# Patient Record
Sex: Female | Born: 1980 | Race: Black or African American | Hispanic: No | Smoking: Never smoker
Health system: Southern US, Community
[De-identification: ages and names within clinical notes are randomized; demographics above are authoritative.]

## PROBLEM LIST (undated history)

## (undated) DIAGNOSIS — R112 Nausea with vomiting, unspecified: Secondary | ICD-10-CM

## (undated) DIAGNOSIS — A048 Other specified bacterial intestinal infections: Secondary | ICD-10-CM

## (undated) DIAGNOSIS — R569 Unspecified convulsions: Secondary | ICD-10-CM

## (undated) DIAGNOSIS — E538 Deficiency of other specified B group vitamins: Secondary | ICD-10-CM

## (undated) DIAGNOSIS — G8929 Other chronic pain: Secondary | ICD-10-CM

## (undated) DIAGNOSIS — E559 Vitamin D deficiency, unspecified: Secondary | ICD-10-CM

## (undated) DIAGNOSIS — F319 Bipolar disorder, unspecified: Secondary | ICD-10-CM

## (undated) DIAGNOSIS — G43909 Migraine, unspecified, not intractable, without status migrainosus: Secondary | ICD-10-CM

## (undated) DIAGNOSIS — D126 Benign neoplasm of colon, unspecified: Secondary | ICD-10-CM

## (undated) DIAGNOSIS — K3184 Gastroparesis: Secondary | ICD-10-CM

## (undated) DIAGNOSIS — N61 Mastitis without abscess: Secondary | ICD-10-CM

## (undated) DIAGNOSIS — D649 Anemia, unspecified: Secondary | ICD-10-CM

## (undated) DIAGNOSIS — K648 Other hemorrhoids: Secondary | ICD-10-CM

## (undated) DIAGNOSIS — I10 Essential (primary) hypertension: Secondary | ICD-10-CM

## (undated) DIAGNOSIS — L509 Urticaria, unspecified: Secondary | ICD-10-CM

## (undated) DIAGNOSIS — E669 Obesity, unspecified: Secondary | ICD-10-CM

## (undated) DIAGNOSIS — F419 Anxiety disorder, unspecified: Secondary | ICD-10-CM

## (undated) DIAGNOSIS — N760 Acute vaginitis: Secondary | ICD-10-CM

## (undated) DIAGNOSIS — K5792 Diverticulitis of intestine, part unspecified, without perforation or abscess without bleeding: Secondary | ICD-10-CM

## (undated) DIAGNOSIS — T4145XA Adverse effect of unspecified anesthetic, initial encounter: Secondary | ICD-10-CM

## (undated) DIAGNOSIS — Z9889 Other specified postprocedural states: Secondary | ICD-10-CM

## (undated) HISTORY — PX: ABDOMINAL HYSTERECTOMY: SHX81

## (undated) HISTORY — DX: Other hemorrhoids: K64.8

## (undated) HISTORY — DX: Benign neoplasm of colon, unspecified: D12.6

## (undated) HISTORY — DX: Vitamin D deficiency, unspecified: E55.9

## (undated) HISTORY — DX: Anemia, unspecified: D64.9

## (undated) HISTORY — DX: Other specified bacterial intestinal infections: A04.8

## (undated) HISTORY — DX: Mastitis without abscess: N61.0

## (undated) HISTORY — DX: Obesity, unspecified: E66.9

## (undated) HISTORY — DX: Essential (primary) hypertension: I10

## (undated) HISTORY — DX: Urticaria, unspecified: L50.9

## (undated) HISTORY — PX: TUMOR REMOVAL: SHX12

## (undated) HISTORY — DX: Migraine, unspecified, not intractable, without status migrainosus: G43.909

## (undated) HISTORY — PX: CHOLECYSTECTOMY: SHX55

## (undated) HISTORY — PX: REDUCTION MAMMAPLASTY: SUR839

## (undated) HISTORY — PX: TUBAL LIGATION: SHX77

## (undated) HISTORY — PX: WISDOM TOOTH EXTRACTION: SHX21

## (undated) HISTORY — DX: Acute vaginitis: N76.0

## (undated) HISTORY — DX: Bipolar disorder, unspecified: F31.9

---

## 1998-02-24 ENCOUNTER — Inpatient Hospital Stay (HOSPITAL_COMMUNITY): Admission: EM | Admit: 1998-02-24 | Discharge: 1998-02-24 | Payer: Self-pay | Admitting: *Deleted

## 2004-08-20 ENCOUNTER — Ambulatory Visit: Payer: Self-pay | Admitting: Internal Medicine

## 2005-09-27 ENCOUNTER — Ambulatory Visit: Payer: Self-pay | Admitting: Internal Medicine

## 2005-10-21 ENCOUNTER — Ambulatory Visit: Payer: Self-pay | Admitting: Internal Medicine

## 2005-10-21 ENCOUNTER — Encounter (INDEPENDENT_AMBULATORY_CARE_PROVIDER_SITE_OTHER): Payer: Self-pay | Admitting: Family Medicine

## 2005-10-21 ENCOUNTER — Ambulatory Visit (HOSPITAL_COMMUNITY): Admission: RE | Admit: 2005-10-21 | Discharge: 2005-10-21 | Payer: Self-pay | Admitting: Internal Medicine

## 2005-10-21 LAB — CONVERTED CEMR LAB: Pap Smear: NORMAL

## 2005-11-13 ENCOUNTER — Ambulatory Visit (HOSPITAL_COMMUNITY): Admission: RE | Admit: 2005-11-13 | Discharge: 2005-11-13 | Payer: Self-pay | Admitting: General Surgery

## 2005-11-13 ENCOUNTER — Encounter (INDEPENDENT_AMBULATORY_CARE_PROVIDER_SITE_OTHER): Payer: Self-pay | Admitting: Specialist

## 2006-03-11 HISTORY — PX: ESOPHAGOGASTRODUODENOSCOPY: SHX1529

## 2006-03-11 HISTORY — PX: APPENDECTOMY: SHX54

## 2006-03-24 ENCOUNTER — Ambulatory Visit: Payer: Self-pay | Admitting: Family Medicine

## 2006-03-27 ENCOUNTER — Ambulatory Visit (HOSPITAL_COMMUNITY): Admission: RE | Admit: 2006-03-27 | Discharge: 2006-03-27 | Payer: Self-pay | Admitting: Family Medicine

## 2006-03-27 ENCOUNTER — Encounter (INDEPENDENT_AMBULATORY_CARE_PROVIDER_SITE_OTHER): Payer: Self-pay | Admitting: Family Medicine

## 2006-03-28 ENCOUNTER — Encounter: Payer: Self-pay | Admitting: Family Medicine

## 2006-03-28 DIAGNOSIS — K219 Gastro-esophageal reflux disease without esophagitis: Secondary | ICD-10-CM | POA: Insufficient documentation

## 2006-03-28 DIAGNOSIS — K648 Other hemorrhoids: Secondary | ICD-10-CM

## 2006-03-28 DIAGNOSIS — G43909 Migraine, unspecified, not intractable, without status migrainosus: Secondary | ICD-10-CM | POA: Insufficient documentation

## 2006-03-28 DIAGNOSIS — K5900 Constipation, unspecified: Secondary | ICD-10-CM | POA: Insufficient documentation

## 2006-03-28 HISTORY — DX: Other hemorrhoids: K64.8

## 2006-04-02 ENCOUNTER — Ambulatory Visit: Payer: Self-pay | Admitting: Family Medicine

## 2006-04-10 ENCOUNTER — Ambulatory Visit: Payer: Self-pay | Admitting: Family Medicine

## 2006-04-24 ENCOUNTER — Encounter: Payer: Self-pay | Admitting: Family Medicine

## 2006-04-29 ENCOUNTER — Telehealth (INDEPENDENT_AMBULATORY_CARE_PROVIDER_SITE_OTHER): Payer: Self-pay | Admitting: Family Medicine

## 2006-04-29 ENCOUNTER — Encounter (INDEPENDENT_AMBULATORY_CARE_PROVIDER_SITE_OTHER): Payer: Self-pay | Admitting: Family Medicine

## 2006-05-06 ENCOUNTER — Ambulatory Visit: Payer: Self-pay | Admitting: Internal Medicine

## 2006-05-06 ENCOUNTER — Encounter (INDEPENDENT_AMBULATORY_CARE_PROVIDER_SITE_OTHER): Payer: Self-pay | Admitting: Family Medicine

## 2006-05-08 ENCOUNTER — Ambulatory Visit: Payer: Self-pay | Admitting: Family Medicine

## 2006-05-09 ENCOUNTER — Encounter (INDEPENDENT_AMBULATORY_CARE_PROVIDER_SITE_OTHER): Payer: Self-pay | Admitting: Family Medicine

## 2006-05-09 ENCOUNTER — Telehealth (INDEPENDENT_AMBULATORY_CARE_PROVIDER_SITE_OTHER): Payer: Self-pay | Admitting: Family Medicine

## 2006-05-09 ENCOUNTER — Ambulatory Visit (HOSPITAL_COMMUNITY): Admission: RE | Admit: 2006-05-09 | Discharge: 2006-05-09 | Payer: Self-pay | Admitting: Family Medicine

## 2006-05-12 ENCOUNTER — Telehealth (INDEPENDENT_AMBULATORY_CARE_PROVIDER_SITE_OTHER): Payer: Self-pay | Admitting: Family Medicine

## 2006-05-26 ENCOUNTER — Ambulatory Visit (HOSPITAL_COMMUNITY): Admission: RE | Admit: 2006-05-26 | Discharge: 2006-05-26 | Payer: Self-pay | Admitting: General Surgery

## 2006-05-26 ENCOUNTER — Encounter (INDEPENDENT_AMBULATORY_CARE_PROVIDER_SITE_OTHER): Payer: Self-pay | Admitting: *Deleted

## 2006-06-16 ENCOUNTER — Telehealth (INDEPENDENT_AMBULATORY_CARE_PROVIDER_SITE_OTHER): Payer: Self-pay | Admitting: Family Medicine

## 2006-06-17 ENCOUNTER — Ambulatory Visit (HOSPITAL_COMMUNITY): Admission: RE | Admit: 2006-06-17 | Discharge: 2006-06-17 | Payer: Self-pay | Admitting: Family Medicine

## 2006-06-17 ENCOUNTER — Ambulatory Visit: Payer: Self-pay | Admitting: Family Medicine

## 2006-06-27 ENCOUNTER — Telehealth (INDEPENDENT_AMBULATORY_CARE_PROVIDER_SITE_OTHER): Payer: Self-pay | Admitting: Family Medicine

## 2006-08-19 ENCOUNTER — Telehealth (INDEPENDENT_AMBULATORY_CARE_PROVIDER_SITE_OTHER): Payer: Self-pay | Admitting: *Deleted

## 2006-08-20 ENCOUNTER — Ambulatory Visit: Payer: Self-pay | Admitting: Family Medicine

## 2006-08-20 ENCOUNTER — Ambulatory Visit (HOSPITAL_COMMUNITY): Admission: RE | Admit: 2006-08-20 | Discharge: 2006-08-20 | Payer: Self-pay | Admitting: Family Medicine

## 2006-08-22 ENCOUNTER — Encounter (INDEPENDENT_AMBULATORY_CARE_PROVIDER_SITE_OTHER): Payer: Self-pay | Admitting: Family Medicine

## 2006-08-22 ENCOUNTER — Telehealth (INDEPENDENT_AMBULATORY_CARE_PROVIDER_SITE_OTHER): Payer: Self-pay | Admitting: *Deleted

## 2006-08-22 LAB — CONVERTED CEMR LAB
Basophils Absolute: 0 10*3/uL (ref 0.0–0.1)
Basophils Relative: 0 % (ref 0–1)
Eosinophils Absolute: 0.2 10*3/uL (ref 0.0–0.7)
Eosinophils Relative: 2 % (ref 0–5)
HCT: 38.3 % (ref 36.0–46.0)
Hemoglobin: 12 g/dL (ref 12.0–15.0)
Lymphocytes Relative: 18 % (ref 12–46)
Lymphs Abs: 1.7 10*3/uL (ref 0.7–3.3)
MCHC: 31.3 g/dL (ref 30.0–36.0)
MCV: 80.3 fL (ref 78.0–100.0)
Monocytes Absolute: 0.4 10*3/uL (ref 0.2–0.7)
Monocytes Relative: 4 % (ref 3–11)
Neutro Abs: 6.9 10*3/uL (ref 1.7–7.7)
Neutrophils Relative %: 75 % (ref 43–77)
Platelets: 338 10*3/uL (ref 150–400)
RBC: 4.77 M/uL (ref 3.87–5.11)
RDW: 14.4 % — ABNORMAL HIGH (ref 11.5–14.0)
Sed Rate: 48 mm/hr — ABNORMAL HIGH (ref 0–22)
WBC: 9.2 10*3/uL (ref 4.0–10.5)

## 2006-08-25 ENCOUNTER — Telehealth (INDEPENDENT_AMBULATORY_CARE_PROVIDER_SITE_OTHER): Payer: Self-pay | Admitting: *Deleted

## 2006-08-28 ENCOUNTER — Encounter (INDEPENDENT_AMBULATORY_CARE_PROVIDER_SITE_OTHER): Payer: Self-pay | Admitting: Family Medicine

## 2006-09-19 ENCOUNTER — Telehealth (INDEPENDENT_AMBULATORY_CARE_PROVIDER_SITE_OTHER): Payer: Self-pay | Admitting: *Deleted

## 2006-09-19 ENCOUNTER — Ambulatory Visit: Payer: Self-pay | Admitting: Family Medicine

## 2006-09-19 DIAGNOSIS — E669 Obesity, unspecified: Secondary | ICD-10-CM | POA: Insufficient documentation

## 2006-09-19 LAB — CONVERTED CEMR LAB
Beta hcg, urine, semiquantitative: NEGATIVE
Glucose, Urine, Semiquant: NEGATIVE
Nitrite: NEGATIVE
Protein, U semiquant: 100
Specific Gravity, Urine: 1.02
Urobilinogen, UA: 1
WBC Urine, dipstick: NEGATIVE
pH: 6

## 2006-09-20 ENCOUNTER — Encounter (INDEPENDENT_AMBULATORY_CARE_PROVIDER_SITE_OTHER): Payer: Self-pay | Admitting: Family Medicine

## 2006-09-21 LAB — CONVERTED CEMR LAB
ALT: 12 units/L (ref 0–35)
AST: 20 units/L (ref 0–37)
Albumin: 3.9 g/dL (ref 3.5–5.2)
Alkaline Phosphatase: 81 units/L (ref 39–117)
Amylase: 47 units/L (ref 0–105)
BUN: 9 mg/dL (ref 6–23)
Basophils Absolute: 0 10*3/uL (ref 0.0–0.1)
Basophils Relative: 0 % (ref 0–1)
CO2: 21 meq/L (ref 19–32)
Calcium: 9 mg/dL (ref 8.4–10.5)
Chloride: 103 meq/L (ref 96–112)
Creatinine, Ser: 0.83 mg/dL (ref 0.40–1.20)
Eosinophils Absolute: 0.2 10*3/uL (ref 0.0–0.7)
Eosinophils Relative: 2 % (ref 0–5)
Glucose, Bld: 70 mg/dL (ref 70–99)
HCT: 41.1 % (ref 36.0–46.0)
Hemoglobin: 12.7 g/dL (ref 12.0–15.0)
Lipase: 18 units/L (ref 0–75)
Lymphocytes Relative: 15 % (ref 12–46)
Lymphs Abs: 1.5 10*3/uL (ref 0.7–3.3)
MCHC: 30.9 g/dL (ref 30.0–36.0)
MCV: 80.9 fL (ref 78.0–100.0)
Monocytes Absolute: 0.4 10*3/uL (ref 0.2–0.7)
Monocytes Relative: 4 % (ref 3–11)
Neutro Abs: 7.9 10*3/uL — ABNORMAL HIGH (ref 1.7–7.7)
Neutrophils Relative %: 80 % — ABNORMAL HIGH (ref 43–77)
Platelets: 263 10*3/uL (ref 150–400)
Potassium: 4.2 meq/L (ref 3.5–5.3)
RBC: 5.08 M/uL (ref 3.87–5.11)
RDW: 15 % — ABNORMAL HIGH (ref 11.5–14.0)
Sodium: 139 meq/L (ref 135–145)
Total Bilirubin: 0.5 mg/dL (ref 0.3–1.2)
Total Protein: 7.6 g/dL (ref 6.0–8.3)
WBC: 9.9 10*3/uL (ref 4.0–10.5)

## 2006-09-22 ENCOUNTER — Telehealth (INDEPENDENT_AMBULATORY_CARE_PROVIDER_SITE_OTHER): Payer: Self-pay | Admitting: *Deleted

## 2006-09-23 ENCOUNTER — Encounter (INDEPENDENT_AMBULATORY_CARE_PROVIDER_SITE_OTHER): Payer: Self-pay | Admitting: Family Medicine

## 2006-09-23 ENCOUNTER — Telehealth (INDEPENDENT_AMBULATORY_CARE_PROVIDER_SITE_OTHER): Payer: Self-pay | Admitting: *Deleted

## 2006-09-26 ENCOUNTER — Telehealth (INDEPENDENT_AMBULATORY_CARE_PROVIDER_SITE_OTHER): Payer: Self-pay | Admitting: Family Medicine

## 2006-09-26 ENCOUNTER — Ambulatory Visit: Payer: Self-pay | Admitting: Family Medicine

## 2006-09-29 ENCOUNTER — Telehealth (INDEPENDENT_AMBULATORY_CARE_PROVIDER_SITE_OTHER): Payer: Self-pay | Admitting: Family Medicine

## 2006-09-30 ENCOUNTER — Telehealth (INDEPENDENT_AMBULATORY_CARE_PROVIDER_SITE_OTHER): Payer: Self-pay | Admitting: Family Medicine

## 2006-10-01 ENCOUNTER — Encounter (INDEPENDENT_AMBULATORY_CARE_PROVIDER_SITE_OTHER): Payer: Self-pay | Admitting: Family Medicine

## 2006-10-02 ENCOUNTER — Telehealth (INDEPENDENT_AMBULATORY_CARE_PROVIDER_SITE_OTHER): Payer: Self-pay | Admitting: *Deleted

## 2006-10-02 ENCOUNTER — Encounter (INDEPENDENT_AMBULATORY_CARE_PROVIDER_SITE_OTHER): Payer: Self-pay | Admitting: Family Medicine

## 2006-10-02 ENCOUNTER — Ambulatory Visit: Payer: Self-pay | Admitting: Orthopedic Surgery

## 2006-10-06 ENCOUNTER — Encounter (INDEPENDENT_AMBULATORY_CARE_PROVIDER_SITE_OTHER): Payer: Self-pay | Admitting: Family Medicine

## 2006-10-06 ENCOUNTER — Ambulatory Visit (HOSPITAL_COMMUNITY): Admission: RE | Admit: 2006-10-06 | Discharge: 2006-10-06 | Payer: Self-pay | Admitting: Family Medicine

## 2006-10-06 ENCOUNTER — Telehealth (INDEPENDENT_AMBULATORY_CARE_PROVIDER_SITE_OTHER): Payer: Self-pay | Admitting: *Deleted

## 2006-10-13 ENCOUNTER — Telehealth (INDEPENDENT_AMBULATORY_CARE_PROVIDER_SITE_OTHER): Payer: Self-pay | Admitting: Family Medicine

## 2006-10-13 ENCOUNTER — Ambulatory Visit: Payer: Self-pay | Admitting: Internal Medicine

## 2006-10-23 ENCOUNTER — Ambulatory Visit: Payer: Self-pay | Admitting: Internal Medicine

## 2006-10-23 ENCOUNTER — Ambulatory Visit (HOSPITAL_COMMUNITY): Admission: RE | Admit: 2006-10-23 | Discharge: 2006-10-23 | Payer: Self-pay | Admitting: Internal Medicine

## 2006-10-30 ENCOUNTER — Ambulatory Visit: Payer: Self-pay | Admitting: Orthopedic Surgery

## 2006-10-31 ENCOUNTER — Ambulatory Visit (HOSPITAL_COMMUNITY): Admission: RE | Admit: 2006-10-31 | Discharge: 2006-10-31 | Payer: Self-pay | Admitting: Orthopedic Surgery

## 2006-11-11 ENCOUNTER — Ambulatory Visit: Payer: Self-pay | Admitting: Orthopedic Surgery

## 2006-11-20 ENCOUNTER — Encounter: Admission: RE | Admit: 2006-11-20 | Discharge: 2006-11-20 | Payer: Self-pay | Admitting: Orthopedic Surgery

## 2006-12-04 ENCOUNTER — Encounter: Admission: RE | Admit: 2006-12-04 | Discharge: 2006-12-04 | Payer: Self-pay | Admitting: Orthopedic Surgery

## 2006-12-15 ENCOUNTER — Other Ambulatory Visit: Admission: RE | Admit: 2006-12-15 | Discharge: 2006-12-15 | Payer: Self-pay | Admitting: Family Medicine

## 2006-12-15 ENCOUNTER — Ambulatory Visit: Payer: Self-pay | Admitting: Family Medicine

## 2006-12-15 ENCOUNTER — Encounter (INDEPENDENT_AMBULATORY_CARE_PROVIDER_SITE_OTHER): Payer: Self-pay | Admitting: Family Medicine

## 2006-12-15 LAB — CONVERTED CEMR LAB
Cholesterol, target level: 200 mg/dL
HDL goal, serum: 40 mg/dL
LDL Goal: 160 mg/dL
Rapid Strep: NEGATIVE

## 2006-12-16 ENCOUNTER — Telehealth (INDEPENDENT_AMBULATORY_CARE_PROVIDER_SITE_OTHER): Payer: Self-pay | Admitting: *Deleted

## 2006-12-16 ENCOUNTER — Encounter (INDEPENDENT_AMBULATORY_CARE_PROVIDER_SITE_OTHER): Payer: Self-pay | Admitting: Family Medicine

## 2006-12-16 LAB — CONVERTED CEMR LAB
Candida species: NEGATIVE
Gardnerella vaginalis: NEGATIVE
Trichomonal Vaginitis: NEGATIVE

## 2006-12-17 ENCOUNTER — Telehealth (INDEPENDENT_AMBULATORY_CARE_PROVIDER_SITE_OTHER): Payer: Self-pay | Admitting: Family Medicine

## 2006-12-19 ENCOUNTER — Telehealth (INDEPENDENT_AMBULATORY_CARE_PROVIDER_SITE_OTHER): Payer: Self-pay | Admitting: Family Medicine

## 2006-12-22 ENCOUNTER — Ambulatory Visit: Payer: Self-pay | Admitting: Orthopedic Surgery

## 2007-01-06 ENCOUNTER — Telehealth (INDEPENDENT_AMBULATORY_CARE_PROVIDER_SITE_OTHER): Payer: Self-pay | Admitting: Family Medicine

## 2007-03-25 ENCOUNTER — Telehealth (INDEPENDENT_AMBULATORY_CARE_PROVIDER_SITE_OTHER): Payer: Self-pay | Admitting: Family Medicine

## 2007-03-26 ENCOUNTER — Telehealth (INDEPENDENT_AMBULATORY_CARE_PROVIDER_SITE_OTHER): Payer: Self-pay | Admitting: Family Medicine

## 2007-04-09 ENCOUNTER — Telehealth (INDEPENDENT_AMBULATORY_CARE_PROVIDER_SITE_OTHER): Payer: Self-pay | Admitting: Family Medicine

## 2007-04-10 ENCOUNTER — Ambulatory Visit: Payer: Self-pay | Admitting: Family Medicine

## 2007-04-10 ENCOUNTER — Telehealth (INDEPENDENT_AMBULATORY_CARE_PROVIDER_SITE_OTHER): Payer: Self-pay | Admitting: *Deleted

## 2007-04-10 DIAGNOSIS — M940 Chondrocostal junction syndrome [Tietze]: Secondary | ICD-10-CM

## 2007-04-10 DIAGNOSIS — I517 Cardiomegaly: Secondary | ICD-10-CM | POA: Insufficient documentation

## 2007-04-10 HISTORY — DX: Chondrocostal junction syndrome (tietze): M94.0

## 2007-04-15 ENCOUNTER — Encounter (INDEPENDENT_AMBULATORY_CARE_PROVIDER_SITE_OTHER): Payer: Self-pay | Admitting: Family Medicine

## 2007-04-15 ENCOUNTER — Ambulatory Visit (HOSPITAL_COMMUNITY): Admission: RE | Admit: 2007-04-15 | Discharge: 2007-04-15 | Payer: Self-pay | Admitting: Family Medicine

## 2007-04-15 ENCOUNTER — Ambulatory Visit: Payer: Self-pay | Admitting: Cardiovascular Disease

## 2007-04-20 ENCOUNTER — Telehealth (INDEPENDENT_AMBULATORY_CARE_PROVIDER_SITE_OTHER): Payer: Self-pay | Admitting: *Deleted

## 2007-04-20 ENCOUNTER — Encounter (INDEPENDENT_AMBULATORY_CARE_PROVIDER_SITE_OTHER): Payer: Self-pay | Admitting: Family Medicine

## 2007-04-27 ENCOUNTER — Telehealth (INDEPENDENT_AMBULATORY_CARE_PROVIDER_SITE_OTHER): Payer: Self-pay | Admitting: *Deleted

## 2007-04-28 ENCOUNTER — Ambulatory Visit: Payer: Self-pay | Admitting: Family Medicine

## 2007-04-28 LAB — CONVERTED CEMR LAB
Bilirubin Urine: NEGATIVE
Blood in Urine, dipstick: NEGATIVE
Glucose, Urine, Semiquant: NEGATIVE
Ketones, urine, test strip: NEGATIVE
Nitrite: POSITIVE
Protein, U semiquant: 30
Specific Gravity, Urine: 1.025
Urobilinogen, UA: 0.2
pH: 7

## 2007-05-06 ENCOUNTER — Encounter (INDEPENDENT_AMBULATORY_CARE_PROVIDER_SITE_OTHER): Payer: Self-pay | Admitting: Family Medicine

## 2007-05-06 ENCOUNTER — Telehealth (INDEPENDENT_AMBULATORY_CARE_PROVIDER_SITE_OTHER): Payer: Self-pay | Admitting: *Deleted

## 2007-05-08 ENCOUNTER — Ambulatory Visit: Payer: Self-pay | Admitting: Family Medicine

## 2007-05-12 ENCOUNTER — Encounter (INDEPENDENT_AMBULATORY_CARE_PROVIDER_SITE_OTHER): Payer: Self-pay | Admitting: Family Medicine

## 2007-05-12 ENCOUNTER — Telehealth (INDEPENDENT_AMBULATORY_CARE_PROVIDER_SITE_OTHER): Payer: Self-pay | Admitting: *Deleted

## 2007-05-15 ENCOUNTER — Encounter (INDEPENDENT_AMBULATORY_CARE_PROVIDER_SITE_OTHER): Payer: Self-pay | Admitting: Family Medicine

## 2007-05-15 LAB — CONVERTED CEMR LAB: Creatinine, Ser: 0.8 mg/dL (ref 0.40–1.20)

## 2007-05-26 ENCOUNTER — Ambulatory Visit (HOSPITAL_COMMUNITY): Admission: RE | Admit: 2007-05-26 | Discharge: 2007-05-26 | Payer: Self-pay | Admitting: Obstetrics & Gynecology

## 2007-05-26 ENCOUNTER — Encounter (INDEPENDENT_AMBULATORY_CARE_PROVIDER_SITE_OTHER): Payer: Self-pay | Admitting: Family Medicine

## 2007-06-09 ENCOUNTER — Telehealth (INDEPENDENT_AMBULATORY_CARE_PROVIDER_SITE_OTHER): Payer: Self-pay | Admitting: Family Medicine

## 2007-06-25 ENCOUNTER — Telehealth (INDEPENDENT_AMBULATORY_CARE_PROVIDER_SITE_OTHER): Payer: Self-pay | Admitting: Family Medicine

## 2007-08-02 ENCOUNTER — Encounter: Admission: RE | Admit: 2007-08-02 | Discharge: 2007-08-02 | Payer: Self-pay | Admitting: Internal Medicine

## 2007-08-10 HISTORY — PX: OTHER SURGICAL HISTORY: SHX169

## 2007-08-12 ENCOUNTER — Encounter: Payer: Self-pay | Admitting: Obstetrics & Gynecology

## 2007-08-12 ENCOUNTER — Inpatient Hospital Stay (HOSPITAL_COMMUNITY): Admission: RE | Admit: 2007-08-12 | Discharge: 2007-08-14 | Payer: Self-pay | Admitting: Obstetrics & Gynecology

## 2007-11-02 ENCOUNTER — Telehealth (INDEPENDENT_AMBULATORY_CARE_PROVIDER_SITE_OTHER): Payer: Self-pay | Admitting: *Deleted

## 2007-11-23 ENCOUNTER — Ambulatory Visit: Payer: Self-pay | Admitting: Internal Medicine

## 2007-11-23 ENCOUNTER — Ambulatory Visit (HOSPITAL_COMMUNITY): Admission: RE | Admit: 2007-11-23 | Discharge: 2007-11-23 | Payer: Self-pay | Admitting: Internal Medicine

## 2007-11-25 ENCOUNTER — Ambulatory Visit: Payer: Self-pay | Admitting: Internal Medicine

## 2007-12-10 ENCOUNTER — Ambulatory Visit: Payer: Self-pay | Admitting: Internal Medicine

## 2008-02-12 ENCOUNTER — Ambulatory Visit: Payer: Self-pay | Admitting: Family Medicine

## 2008-02-12 DIAGNOSIS — M545 Low back pain, unspecified: Secondary | ICD-10-CM | POA: Insufficient documentation

## 2008-02-12 DIAGNOSIS — I10 Essential (primary) hypertension: Secondary | ICD-10-CM | POA: Insufficient documentation

## 2008-02-17 ENCOUNTER — Encounter (INDEPENDENT_AMBULATORY_CARE_PROVIDER_SITE_OTHER): Payer: Self-pay | Admitting: Family Medicine

## 2008-03-18 ENCOUNTER — Ambulatory Visit: Payer: Self-pay | Admitting: Family Medicine

## 2008-03-21 LAB — CONVERTED CEMR LAB
ALT: 16 units/L (ref 0–35)
AST: 13 units/L (ref 0–37)
Albumin: 3.8 g/dL (ref 3.5–5.2)
Alkaline Phosphatase: 98 units/L (ref 39–117)
BUN: 11 mg/dL (ref 6–23)
Basophils Absolute: 0 10*3/uL (ref 0.0–0.1)
Basophils Relative: 0 % (ref 0–1)
CO2: 22 meq/L (ref 19–32)
Calcium: 8.9 mg/dL (ref 8.4–10.5)
Chloride: 105 meq/L (ref 96–112)
Cholesterol: 132 mg/dL (ref 0–200)
Creatinine, Ser: 0.73 mg/dL (ref 0.40–1.20)
Eosinophils Absolute: 0.2 10*3/uL (ref 0.0–0.7)
Eosinophils Relative: 3 % (ref 0–5)
Glucose, Bld: 83 mg/dL (ref 70–99)
HCT: 39.5 % (ref 36.0–46.0)
HDL: 43 mg/dL (ref >39)
Hemoglobin: 12.2 g/dL (ref 12.0–15.0)
LDL Cholesterol: 74 mg/dL (ref 0–99)
Lymphocytes Relative: 28 % (ref 12–46)
Lymphs Abs: 2.3 10*3/uL (ref 0.7–4.0)
MCHC: 30.9 g/dL (ref 30.0–36.0)
MCV: 79.8 fL (ref 78.0–100.0)
Monocytes Absolute: 0.4 10*3/uL (ref 0.1–1.0)
Monocytes Relative: 4 % (ref 3–12)
Neutro Abs: 5.4 10*3/uL (ref 1.7–7.7)
Neutrophils Relative %: 65 % (ref 43–77)
Platelets: 467 10*3/uL — ABNORMAL HIGH (ref 150–400)
Potassium: 3.9 meq/L (ref 3.5–5.3)
RBC: 4.95 M/uL (ref 3.87–5.11)
RDW: 14.5 % (ref 11.5–15.5)
Sodium: 141 meq/L (ref 135–145)
TSH: 0.464 microintl units/mL (ref 0.350–4.50)
Total Bilirubin: 0.2 mg/dL — ABNORMAL LOW (ref 0.3–1.2)
Total CHOL/HDL Ratio: 3.1
Total Protein: 7.6 g/dL (ref 6.0–8.3)
Triglycerides: 74 mg/dL (ref <150)
VLDL: 15 mg/dL (ref 0–40)
WBC: 8.3 10*3/uL (ref 4.0–10.5)

## 2008-03-28 ENCOUNTER — Telehealth (INDEPENDENT_AMBULATORY_CARE_PROVIDER_SITE_OTHER): Payer: Self-pay | Admitting: *Deleted

## 2008-03-28 ENCOUNTER — Ambulatory Visit: Payer: Self-pay | Admitting: Family Medicine

## 2008-03-28 DIAGNOSIS — R079 Chest pain, unspecified: Secondary | ICD-10-CM | POA: Insufficient documentation

## 2008-03-28 DIAGNOSIS — F3130 Bipolar disorder, current episode depressed, mild or moderate severity, unspecified: Secondary | ICD-10-CM

## 2008-03-28 HISTORY — DX: Bipolar disorder, current episode depressed, mild or moderate severity, unspecified: F31.30

## 2008-04-12 ENCOUNTER — Encounter (INDEPENDENT_AMBULATORY_CARE_PROVIDER_SITE_OTHER): Payer: Self-pay | Admitting: Family Medicine

## 2008-04-18 ENCOUNTER — Ambulatory Visit: Payer: Self-pay | Admitting: Family Medicine

## 2008-04-18 DIAGNOSIS — R809 Proteinuria, unspecified: Secondary | ICD-10-CM | POA: Insufficient documentation

## 2008-04-18 LAB — CONVERTED CEMR LAB
Bilirubin Urine: NEGATIVE
Blood in Urine, dipstick: NEGATIVE
Glucose, Urine, Semiquant: NEGATIVE
Ketones, urine, test strip: NEGATIVE
Nitrite: NEGATIVE
Protein, U semiquant: 30
Specific Gravity, Urine: 1.02
Urobilinogen, UA: 0.2
WBC Urine, dipstick: NEGATIVE
pH: 6.5

## 2008-05-16 ENCOUNTER — Ambulatory Visit: Payer: Self-pay | Admitting: Family Medicine

## 2008-05-30 ENCOUNTER — Ambulatory Visit: Payer: Self-pay | Admitting: Family Medicine

## 2008-05-30 ENCOUNTER — Telehealth (INDEPENDENT_AMBULATORY_CARE_PROVIDER_SITE_OTHER): Payer: Self-pay | Admitting: *Deleted

## 2008-05-31 ENCOUNTER — Telehealth (INDEPENDENT_AMBULATORY_CARE_PROVIDER_SITE_OTHER): Payer: Self-pay | Admitting: Family Medicine

## 2008-05-31 ENCOUNTER — Emergency Department (HOSPITAL_COMMUNITY): Admission: EM | Admit: 2008-05-31 | Discharge: 2008-05-31 | Payer: Self-pay | Admitting: Emergency Medicine

## 2008-06-02 ENCOUNTER — Telehealth (INDEPENDENT_AMBULATORY_CARE_PROVIDER_SITE_OTHER): Payer: Self-pay | Admitting: Family Medicine

## 2008-06-06 ENCOUNTER — Telehealth (INDEPENDENT_AMBULATORY_CARE_PROVIDER_SITE_OTHER): Payer: Self-pay | Admitting: *Deleted

## 2008-06-08 ENCOUNTER — Ambulatory Visit: Payer: Self-pay | Admitting: Cardiology

## 2008-06-09 ENCOUNTER — Ambulatory Visit: Payer: Self-pay | Admitting: Internal Medicine

## 2008-06-09 ENCOUNTER — Ambulatory Visit (HOSPITAL_COMMUNITY): Admission: RE | Admit: 2008-06-09 | Discharge: 2008-06-09 | Payer: Self-pay | Admitting: Cardiology

## 2008-06-09 ENCOUNTER — Encounter: Payer: Self-pay | Admitting: Cardiology

## 2008-06-17 ENCOUNTER — Ambulatory Visit: Payer: Self-pay | Admitting: Family Medicine

## 2008-06-20 ENCOUNTER — Telehealth: Payer: Self-pay | Admitting: Family Medicine

## 2008-06-20 ENCOUNTER — Telehealth (INDEPENDENT_AMBULATORY_CARE_PROVIDER_SITE_OTHER): Payer: Self-pay | Admitting: *Deleted

## 2008-06-24 ENCOUNTER — Ambulatory Visit: Payer: Self-pay | Admitting: Family Medicine

## 2008-06-30 ENCOUNTER — Encounter (INDEPENDENT_AMBULATORY_CARE_PROVIDER_SITE_OTHER): Payer: Self-pay | Admitting: Family Medicine

## 2008-07-06 ENCOUNTER — Telehealth (INDEPENDENT_AMBULATORY_CARE_PROVIDER_SITE_OTHER): Payer: Self-pay | Admitting: Family Medicine

## 2008-07-11 ENCOUNTER — Telehealth (INDEPENDENT_AMBULATORY_CARE_PROVIDER_SITE_OTHER): Payer: Self-pay | Admitting: *Deleted

## 2008-07-13 ENCOUNTER — Ambulatory Visit: Payer: Self-pay | Admitting: Family Medicine

## 2008-07-13 ENCOUNTER — Ambulatory Visit (HOSPITAL_COMMUNITY): Payer: Self-pay | Admitting: Psychiatry

## 2008-07-20 ENCOUNTER — Telehealth (INDEPENDENT_AMBULATORY_CARE_PROVIDER_SITE_OTHER): Payer: Self-pay | Admitting: Family Medicine

## 2008-07-20 ENCOUNTER — Ambulatory Visit (HOSPITAL_COMMUNITY): Payer: Self-pay | Admitting: Psychiatry

## 2008-07-28 ENCOUNTER — Ambulatory Visit (HOSPITAL_COMMUNITY): Payer: Self-pay | Admitting: Psychiatry

## 2008-08-02 ENCOUNTER — Telehealth (INDEPENDENT_AMBULATORY_CARE_PROVIDER_SITE_OTHER): Payer: Self-pay | Admitting: Family Medicine

## 2008-08-02 ENCOUNTER — Ambulatory Visit (HOSPITAL_COMMUNITY): Payer: Self-pay | Admitting: Psychiatry

## 2008-08-04 ENCOUNTER — Ambulatory Visit (HOSPITAL_COMMUNITY): Payer: Self-pay | Admitting: Psychiatry

## 2008-08-09 HISTORY — PX: BREAST REDUCTION SURGERY: SHX8

## 2008-08-11 ENCOUNTER — Ambulatory Visit (HOSPITAL_COMMUNITY): Payer: Self-pay | Admitting: Psychiatry

## 2008-08-16 ENCOUNTER — Encounter (INDEPENDENT_AMBULATORY_CARE_PROVIDER_SITE_OTHER): Payer: Self-pay | Admitting: Plastic Surgery

## 2008-08-16 ENCOUNTER — Ambulatory Visit (HOSPITAL_BASED_OUTPATIENT_CLINIC_OR_DEPARTMENT_OTHER): Admission: RE | Admit: 2008-08-16 | Discharge: 2008-08-16 | Payer: Self-pay | Admitting: Plastic Surgery

## 2008-08-17 ENCOUNTER — Encounter (INDEPENDENT_AMBULATORY_CARE_PROVIDER_SITE_OTHER): Payer: Self-pay | Admitting: Family Medicine

## 2008-08-25 ENCOUNTER — Ambulatory Visit (HOSPITAL_COMMUNITY): Payer: Self-pay | Admitting: Psychiatry

## 2008-08-26 ENCOUNTER — Ambulatory Visit (HOSPITAL_COMMUNITY): Payer: Self-pay | Admitting: Psychiatry

## 2008-09-08 ENCOUNTER — Ambulatory Visit (HOSPITAL_COMMUNITY): Payer: Self-pay | Admitting: Psychiatry

## 2008-09-15 ENCOUNTER — Ambulatory Visit: Payer: Self-pay | Admitting: Family Medicine

## 2008-09-15 DIAGNOSIS — F319 Bipolar disorder, unspecified: Secondary | ICD-10-CM | POA: Insufficient documentation

## 2008-09-15 HISTORY — DX: Bipolar disorder, unspecified: F31.9

## 2008-09-15 LAB — CONVERTED CEMR LAB

## 2008-09-22 ENCOUNTER — Ambulatory Visit (HOSPITAL_COMMUNITY): Payer: Self-pay | Admitting: Psychiatry

## 2008-10-06 ENCOUNTER — Ambulatory Visit (HOSPITAL_COMMUNITY): Payer: Self-pay | Admitting: Psychiatry

## 2008-10-10 ENCOUNTER — Encounter (INDEPENDENT_AMBULATORY_CARE_PROVIDER_SITE_OTHER): Payer: Self-pay | Admitting: Family Medicine

## 2008-10-11 ENCOUNTER — Ambulatory Visit: Payer: Self-pay | Admitting: Family Medicine

## 2008-10-11 ENCOUNTER — Ambulatory Visit (HOSPITAL_COMMUNITY): Payer: Self-pay | Admitting: Psychiatry

## 2008-10-11 DIAGNOSIS — R7309 Other abnormal glucose: Secondary | ICD-10-CM | POA: Insufficient documentation

## 2008-10-13 ENCOUNTER — Emergency Department (HOSPITAL_COMMUNITY): Admission: EM | Admit: 2008-10-13 | Discharge: 2008-10-14 | Payer: Self-pay | Admitting: Emergency Medicine

## 2008-10-13 ENCOUNTER — Telehealth (INDEPENDENT_AMBULATORY_CARE_PROVIDER_SITE_OTHER): Payer: Self-pay | Admitting: Family Medicine

## 2008-10-17 ENCOUNTER — Telehealth (INDEPENDENT_AMBULATORY_CARE_PROVIDER_SITE_OTHER): Payer: Self-pay | Admitting: *Deleted

## 2008-10-20 ENCOUNTER — Telehealth (INDEPENDENT_AMBULATORY_CARE_PROVIDER_SITE_OTHER): Payer: Self-pay | Admitting: Family Medicine

## 2008-10-27 ENCOUNTER — Ambulatory Visit: Payer: Self-pay | Admitting: Family Medicine

## 2008-11-06 ENCOUNTER — Emergency Department (HOSPITAL_COMMUNITY): Admission: EM | Admit: 2008-11-06 | Discharge: 2008-11-06 | Payer: Self-pay | Admitting: Emergency Medicine

## 2008-11-21 ENCOUNTER — Telehealth (INDEPENDENT_AMBULATORY_CARE_PROVIDER_SITE_OTHER): Payer: Self-pay | Admitting: *Deleted

## 2008-11-24 ENCOUNTER — Ambulatory Visit (HOSPITAL_COMMUNITY): Payer: Self-pay | Admitting: Psychiatry

## 2008-12-05 ENCOUNTER — Encounter (INDEPENDENT_AMBULATORY_CARE_PROVIDER_SITE_OTHER): Payer: Self-pay | Admitting: Family Medicine

## 2008-12-18 ENCOUNTER — Emergency Department (HOSPITAL_COMMUNITY): Admission: EM | Admit: 2008-12-18 | Discharge: 2008-12-18 | Payer: Self-pay | Admitting: Emergency Medicine

## 2008-12-19 ENCOUNTER — Ambulatory Visit (HOSPITAL_COMMUNITY): Payer: Self-pay | Admitting: Psychiatry

## 2008-12-20 ENCOUNTER — Emergency Department (HOSPITAL_COMMUNITY): Admission: EM | Admit: 2008-12-20 | Discharge: 2008-12-20 | Payer: Self-pay | Admitting: Emergency Medicine

## 2008-12-22 ENCOUNTER — Ambulatory Visit (HOSPITAL_COMMUNITY): Payer: Self-pay | Admitting: Psychiatry

## 2008-12-29 ENCOUNTER — Emergency Department (HOSPITAL_COMMUNITY): Admission: EM | Admit: 2008-12-29 | Discharge: 2008-12-29 | Payer: Self-pay | Admitting: Emergency Medicine

## 2009-01-05 DIAGNOSIS — F329 Major depressive disorder, single episode, unspecified: Secondary | ICD-10-CM | POA: Insufficient documentation

## 2009-01-06 ENCOUNTER — Encounter (INDEPENDENT_AMBULATORY_CARE_PROVIDER_SITE_OTHER): Payer: Self-pay | Admitting: Family Medicine

## 2009-01-09 DIAGNOSIS — N390 Urinary tract infection, site not specified: Secondary | ICD-10-CM | POA: Insufficient documentation

## 2009-01-13 DIAGNOSIS — K5730 Diverticulosis of large intestine without perforation or abscess without bleeding: Secondary | ICD-10-CM | POA: Insufficient documentation

## 2009-04-27 ENCOUNTER — Emergency Department (HOSPITAL_COMMUNITY): Admission: EM | Admit: 2009-04-27 | Discharge: 2009-04-27 | Payer: Self-pay | Admitting: Emergency Medicine

## 2009-04-27 ENCOUNTER — Ambulatory Visit (HOSPITAL_COMMUNITY): Payer: Self-pay | Admitting: Psychiatry

## 2009-05-02 DIAGNOSIS — I152 Hypertension secondary to endocrine disorders: Secondary | ICD-10-CM | POA: Insufficient documentation

## 2009-05-06 ENCOUNTER — Encounter
Admission: RE | Admit: 2009-05-06 | Discharge: 2009-05-06 | Payer: Self-pay | Admitting: Physical Medicine and Rehabilitation

## 2009-05-23 ENCOUNTER — Encounter: Admission: RE | Admit: 2009-05-23 | Discharge: 2009-05-23 | Payer: Self-pay | Admitting: Neurosurgery

## 2009-05-30 DIAGNOSIS — R1013 Epigastric pain: Secondary | ICD-10-CM

## 2009-05-30 DIAGNOSIS — R112 Nausea with vomiting, unspecified: Secondary | ICD-10-CM | POA: Insufficient documentation

## 2009-05-30 DIAGNOSIS — R109 Unspecified abdominal pain: Secondary | ICD-10-CM | POA: Insufficient documentation

## 2009-05-30 HISTORY — DX: Epigastric pain: R10.13

## 2009-05-31 ENCOUNTER — Ambulatory Visit: Payer: Self-pay | Admitting: Internal Medicine

## 2009-05-31 ENCOUNTER — Telehealth (INDEPENDENT_AMBULATORY_CARE_PROVIDER_SITE_OTHER): Payer: Self-pay

## 2009-06-02 ENCOUNTER — Telehealth (INDEPENDENT_AMBULATORY_CARE_PROVIDER_SITE_OTHER): Payer: Self-pay

## 2009-06-04 DIAGNOSIS — R1084 Generalized abdominal pain: Secondary | ICD-10-CM | POA: Insufficient documentation

## 2009-06-04 DIAGNOSIS — K5732 Diverticulitis of large intestine without perforation or abscess without bleeding: Secondary | ICD-10-CM | POA: Insufficient documentation

## 2009-06-04 HISTORY — DX: Generalized abdominal pain: R10.84

## 2009-06-05 ENCOUNTER — Encounter: Payer: Self-pay | Admitting: Internal Medicine

## 2009-06-06 ENCOUNTER — Ambulatory Visit (HOSPITAL_COMMUNITY): Admission: RE | Admit: 2009-06-06 | Discharge: 2009-06-06 | Payer: Self-pay | Admitting: Gastroenterology

## 2009-06-07 ENCOUNTER — Encounter: Payer: Self-pay | Admitting: Internal Medicine

## 2009-06-07 ENCOUNTER — Telehealth (INDEPENDENT_AMBULATORY_CARE_PROVIDER_SITE_OTHER): Payer: Self-pay

## 2009-06-08 ENCOUNTER — Emergency Department (HOSPITAL_COMMUNITY): Admission: EM | Admit: 2009-06-08 | Discharge: 2009-06-08 | Payer: Self-pay | Admitting: Emergency Medicine

## 2009-06-09 HISTORY — PX: BILATERAL SALPINGOOPHORECTOMY: SHX1223

## 2009-06-12 ENCOUNTER — Encounter: Payer: Self-pay | Admitting: Internal Medicine

## 2009-06-14 ENCOUNTER — Ambulatory Visit (HOSPITAL_COMMUNITY): Admission: RE | Admit: 2009-06-14 | Discharge: 2009-06-14 | Payer: Self-pay | Admitting: Obstetrics & Gynecology

## 2009-06-22 ENCOUNTER — Encounter: Payer: Self-pay | Admitting: Internal Medicine

## 2009-06-28 ENCOUNTER — Emergency Department (HOSPITAL_COMMUNITY): Admission: EM | Admit: 2009-06-28 | Discharge: 2009-06-28 | Payer: Self-pay | Admitting: Emergency Medicine

## 2009-07-04 ENCOUNTER — Ambulatory Visit (HOSPITAL_COMMUNITY): Payer: Self-pay | Admitting: Psychiatry

## 2009-08-03 ENCOUNTER — Ambulatory Visit (HOSPITAL_COMMUNITY): Payer: Self-pay | Admitting: Psychiatry

## 2009-08-15 ENCOUNTER — Observation Stay (HOSPITAL_COMMUNITY): Admission: EM | Admit: 2009-08-15 | Discharge: 2009-08-16 | Payer: Self-pay | Admitting: Emergency Medicine

## 2009-08-22 ENCOUNTER — Encounter: Admission: RE | Admit: 2009-08-22 | Discharge: 2009-08-22 | Payer: Self-pay

## 2009-08-31 ENCOUNTER — Ambulatory Visit (HOSPITAL_COMMUNITY): Payer: Self-pay | Admitting: Psychiatry

## 2009-09-08 ENCOUNTER — Emergency Department (HOSPITAL_COMMUNITY): Admission: EM | Admit: 2009-09-08 | Discharge: 2009-09-08 | Payer: Self-pay | Admitting: Emergency Medicine

## 2009-10-12 ENCOUNTER — Encounter: Payer: Self-pay | Admitting: Internal Medicine

## 2009-10-18 ENCOUNTER — Emergency Department (HOSPITAL_COMMUNITY): Admission: EM | Admit: 2009-10-18 | Discharge: 2009-10-18 | Payer: Self-pay | Admitting: Emergency Medicine

## 2009-10-19 ENCOUNTER — Ambulatory Visit (HOSPITAL_COMMUNITY): Payer: Self-pay | Admitting: Psychiatry

## 2009-11-10 DIAGNOSIS — G40909 Epilepsy, unspecified, not intractable, without status epilepticus: Secondary | ICD-10-CM | POA: Insufficient documentation

## 2009-12-11 ENCOUNTER — Ambulatory Visit: Payer: Self-pay | Admitting: Internal Medicine

## 2009-12-11 DIAGNOSIS — K921 Melena: Secondary | ICD-10-CM | POA: Insufficient documentation

## 2009-12-11 DIAGNOSIS — D649 Anemia, unspecified: Secondary | ICD-10-CM | POA: Insufficient documentation

## 2009-12-11 DIAGNOSIS — R1319 Other dysphagia: Secondary | ICD-10-CM

## 2009-12-11 HISTORY — DX: Other dysphagia: R13.19

## 2009-12-12 ENCOUNTER — Encounter: Payer: Self-pay | Admitting: Internal Medicine

## 2009-12-14 ENCOUNTER — Encounter: Payer: Self-pay | Admitting: Internal Medicine

## 2009-12-15 ENCOUNTER — Encounter: Payer: Self-pay | Admitting: Internal Medicine

## 2009-12-21 ENCOUNTER — Telehealth (INDEPENDENT_AMBULATORY_CARE_PROVIDER_SITE_OTHER): Payer: Self-pay

## 2009-12-26 ENCOUNTER — Encounter: Payer: Self-pay | Admitting: Internal Medicine

## 2010-01-03 ENCOUNTER — Encounter: Payer: Self-pay | Admitting: Internal Medicine

## 2010-01-04 ENCOUNTER — Ambulatory Visit: Payer: Self-pay | Admitting: Internal Medicine

## 2010-01-04 ENCOUNTER — Ambulatory Visit (HOSPITAL_COMMUNITY): Admission: RE | Admit: 2010-01-04 | Discharge: 2010-01-04 | Payer: Self-pay | Admitting: Internal Medicine

## 2010-01-04 HISTORY — PX: COLONOSCOPY: SHX174

## 2010-01-04 HISTORY — PX: ESOPHAGOGASTRODUODENOSCOPY: SHX1529

## 2010-01-08 ENCOUNTER — Encounter: Payer: Self-pay | Admitting: Internal Medicine

## 2010-01-08 ENCOUNTER — Encounter: Payer: Self-pay | Admitting: Urgent Care

## 2010-01-09 ENCOUNTER — Telehealth (INDEPENDENT_AMBULATORY_CARE_PROVIDER_SITE_OTHER): Payer: Self-pay

## 2010-01-13 ENCOUNTER — Emergency Department (HOSPITAL_COMMUNITY): Admission: EM | Admit: 2010-01-13 | Discharge: 2010-01-13 | Payer: Self-pay | Admitting: Emergency Medicine

## 2010-01-26 ENCOUNTER — Encounter (INDEPENDENT_AMBULATORY_CARE_PROVIDER_SITE_OTHER): Payer: Self-pay

## 2010-02-16 ENCOUNTER — Telehealth (INDEPENDENT_AMBULATORY_CARE_PROVIDER_SITE_OTHER): Payer: Self-pay

## 2010-02-16 ENCOUNTER — Encounter: Payer: Self-pay | Admitting: Internal Medicine

## 2010-02-19 ENCOUNTER — Ambulatory Visit: Payer: Self-pay | Admitting: Cardiology

## 2010-02-19 ENCOUNTER — Telehealth (INDEPENDENT_AMBULATORY_CARE_PROVIDER_SITE_OTHER): Payer: Self-pay

## 2010-02-19 ENCOUNTER — Encounter: Payer: Self-pay | Admitting: Internal Medicine

## 2010-02-19 LAB — CONVERTED CEMR LAB
Basophils Absolute: 0 10*3/uL (ref 0.0–0.1)
Basophils Relative: 0 % (ref 0–1)
Eosinophils Absolute: 0.3 10*3/uL (ref 0.0–0.7)
Eosinophils Relative: 3 % (ref 0–5)
HCT: 38.1 % (ref 36.0–46.0)
Hemoglobin: 11.4 g/dL — ABNORMAL LOW (ref 12.0–15.0)
Lymphocytes Relative: 23 % (ref 12–46)
Lymphs Abs: 2.3 10*3/uL (ref 0.7–4.0)
MCHC: 29.9 g/dL — ABNORMAL LOW (ref 30.0–36.0)
MCV: 81.2 fL (ref 78.0–100.0)
Monocytes Absolute: 0.4 10*3/uL (ref 0.1–1.0)
Monocytes Relative: 4 % (ref 3–12)
Neutro Abs: 7.2 10*3/uL (ref 1.7–7.7)
Neutrophils Relative %: 70 % (ref 43–77)
Platelets: 376 10*3/uL (ref 150–400)
RBC: 4.69 M/uL (ref 3.87–5.11)
RDW: 16.6 % — ABNORMAL HIGH (ref 11.5–15.5)
WBC: 10.3 10*3/uL (ref 4.0–10.5)

## 2010-02-20 ENCOUNTER — Encounter: Payer: Self-pay | Admitting: Cardiology

## 2010-02-21 ENCOUNTER — Ambulatory Visit: Payer: Self-pay | Admitting: Internal Medicine

## 2010-02-23 ENCOUNTER — Ambulatory Visit (HOSPITAL_COMMUNITY)
Admission: RE | Admit: 2010-02-23 | Discharge: 2010-02-23 | Payer: Self-pay | Source: Home / Self Care | Attending: Internal Medicine | Admitting: Internal Medicine

## 2010-02-25 ENCOUNTER — Emergency Department (HOSPITAL_COMMUNITY)
Admission: EM | Admit: 2010-02-25 | Discharge: 2010-02-25 | Payer: Self-pay | Source: Home / Self Care | Admitting: Emergency Medicine

## 2010-03-08 DIAGNOSIS — M79609 Pain in unspecified limb: Secondary | ICD-10-CM | POA: Insufficient documentation

## 2010-03-29 ENCOUNTER — Inpatient Hospital Stay (HOSPITAL_COMMUNITY): Admission: EM | Admit: 2010-03-29 | Discharge: 2010-04-04 | Payer: Self-pay | Source: Home / Self Care

## 2010-03-30 NOTE — H&P (Signed)
NAME:  Brenda Richardson, Brenda Richardson              ACCOUNT NO.:  [id number]  MEDICAL RECORD NO.:  [id number]          PATIENT TYPE:  INP  LOCATION:  A332                          FACILITY:  [facility]  PHYSICIAN:  Wilson Singer, M.D.DATE OF BIRTH:  1980/10/04  DATE OF ADMISSION:  03/29/2010 DATE OF DISCHARGE:  LH                             HISTORY & PHYSICAL   PRIMARY CARE PHYSICIAN.:  There is no primary care.  REFERRING PHYSICIAN:  Bethann Berkshire, MD  CHIEF COMPLAINT:  Right breast pain.  HISTORY OF PRESENTING ILLNESS:  This 30 year old lady comes in with right breast pain.  She said the pain and warmness started approximately 4-5 days ago.  She has had these symptoms previously and she went to see her primary care physician in [place] 3 days ago, who prescribed doxycycline for diagnosis of mastitis.  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  She first had mastitis approximately 5-6 months following breast reduction surgery in June 2010.  She needed hospitalization with intravenous antibiotics for this.  She had a subsequent similar episode approximately in November 2011.  On both occasions she needed hospitalization with intravenous antibiotics.  She returns now with similar complaints.  She has been feeling hot and cold, but has not had a documented fever.  She has had some nausea, but no vomiting.  PAST MEDICAL HISTORY:  Significant for bipolar disorder with anxiety, history of panic disorder, and history of diverticulosis.  PAST SURGICAL HISTORY:  Breast reduction in June 2010, history of left shoulder tumor excision which was apparently benign, salpingo- oophorectomy and hysterectomy in April 2011 diaper, laparoscopic cholecystectomy at the age of 31, and appendectomy in 2008.  MEDICATIONS: 1. Gabapentin 300 mg nightly. 2. Effexor 150 mg daily. 3. Verapamil 120 mg daily. 4. Mirtazapine 15 mg nightly. 5. Alprazolam 1 mg b.i.d.  ALLERGIES:  NUBAIN, TORADOL, TRAMADOL,  VICODIN, all of these medications produce nausea and vomiting so I doubt this is a true allergy.  SOCIAL HISTORY:  She has been married for 7 years and lives with her husband and 3 children, age 38, 96, and 20.  She does not smoke, does not drink alcohol.  FAMILY HISTORY:  Noncontributory.  REVIEW OF SYSTEMS:  Apart from the symptoms as mentioned above, there are no other symptoms referable to all systems reviewed.  PHYSICAL EXAMINATION:  VITAL SIGNS:  The patient is afebrile, 97.9, blood pressure 130/91, pulse 80 and sinus rhythm, respiratory rate 12- 14, and saturation 100% on room air. GENERAL:  There is no clubbing, clinical anemia, or jaundice.  There is no peripheral or central cyanosis. CARDIOVASCULAR:  Heart sounds are present and normal without any murmurs.  Jugular venous pressure is not raised.  She is not clinically heart failure. RESPIRATORY:  Lung fields are clear to auscultation and percussion. ABDOMEN:  Soft and nontender with no hepatosplenomegaly. NEUROLOGICAL:  She is alert and oriented without any focal neurologicalsigns. BREASTS:  Some redness in the right inferior aspect of the breast associated with increase in temperature.  There is also some tenderness. The left breast is within normal limits.  The right breast does not have any masses  and there is no lymphadenopathy in either axilla.  There is no supraclavicular lymphadenopathy either. MUSCULOSKELETAL:  No major abnormalities.  INVESTIGATIONS:  Hemoglobin 11.7, white blood cell count 11.8, and platelets 380.  Sodium 135, potassium 3.4, bicarbonate 26, glucose 120, BUN 6, and creatinine 0.7.  CT scan of the chest with contrast shows mild skin thickening and minimal subcutaneous infiltration of the inferior right breast consistent with possible cellulitis.  There is no obvious space-occupying lesion to suggest abscess.  PROBLEM LIST: 1. Right mastitis which is not responding to outpatient treatment. 2.  Bipolar disorder with anxiety. 3. Obesity.  PLAN: 1. Admit to the regular floor. 2. Intravenous antibiotics. 3. Analgesia.  Further recommendations will depend on the patient's progress.     Wilson Singer, M.D.     NCG/MEDQ  D:  03/29/2010  T:  03/30/2010  Job:  [id number]  Electronically Signed by Lilly Cove M.D. on 03/30/2010 04:21:32 PM

## 2010-04-01 ENCOUNTER — Encounter: Payer: Self-pay | Admitting: Internal Medicine

## 2010-04-01 ENCOUNTER — Encounter: Payer: Self-pay | Admitting: Orthopedic Surgery

## 2010-04-01 ENCOUNTER — Encounter: Payer: Self-pay | Admitting: Family Medicine

## 2010-04-02 LAB — CBC
HCT: 35.8 % — ABNORMAL LOW (ref 36.0–46.0)
HCT: 33.1 % — ABNORMAL LOW (ref 36.0–46.0)
Hemoglobin: 11.7 g/dL — ABNORMAL LOW (ref 12.0–15.0)
Hemoglobin: 10.8 g/dL — ABNORMAL LOW (ref 12.0–15.0)
MCH: 25.5 pg — ABNORMAL LOW (ref 26.0–34.0)
MCH: 25.9 pg — ABNORMAL LOW (ref 26.0–34.0)
MCHC: 32.7 g/dL (ref 30.0–36.0)
MCHC: 32.6 g/dL (ref 30.0–36.0)
MCV: 78.2 fL (ref 78.0–100.0)
MCV: 79.4 fL (ref 78.0–100.0)
Platelets: 380 10*3/uL (ref 150–400)
Platelets: 367 10*3/uL (ref 150–400)
RBC: 4.58 MIL/uL (ref 3.87–5.11)
RBC: 4.17 MIL/uL (ref 3.87–5.11)
RDW: 15.8 % — ABNORMAL HIGH (ref 11.5–15.5)
RDW: 16 % — ABNORMAL HIGH (ref 11.5–15.5)
WBC: 11.8 10*3/uL — ABNORMAL HIGH (ref 4.0–10.5)
WBC: 8.8 10*3/uL (ref 4.0–10.5)

## 2010-04-02 LAB — DIFFERENTIAL
Basophils Absolute: 0 10*3/uL (ref 0.0–0.1)
Basophils Absolute: 0 10*3/uL (ref 0.0–0.1)
Basophils Relative: 0 % (ref 0–1)
Basophils Relative: 0 % (ref 0–1)
Eosinophils Absolute: 0.3 10*3/uL (ref 0.0–0.7)
Eosinophils Absolute: 0.4 10*3/uL (ref 0.0–0.7)
Eosinophils Relative: 2 % (ref 0–5)
Eosinophils Relative: 5 % (ref 0–5)
Lymphocytes Relative: 17 % (ref 12–46)
Lymphocytes Relative: 22 % (ref 12–46)
Lymphs Abs: 2 10*3/uL (ref 0.7–4.0)
Lymphs Abs: 2 10*3/uL (ref 0.7–4.0)
Monocytes Absolute: 0.4 10*3/uL (ref 0.1–1.0)
Monocytes Absolute: 0.3 10*3/uL (ref 0.1–1.0)
Monocytes Relative: 4 % (ref 3–12)
Monocytes Relative: 4 % (ref 3–12)
Neutro Abs: 9.1 10*3/uL — ABNORMAL HIGH (ref 1.7–7.7)
Neutro Abs: 6 10*3/uL (ref 1.7–7.7)
Neutrophils Relative %: 77 % (ref 43–77)
Neutrophils Relative %: 69 % (ref 43–77)

## 2010-04-02 LAB — COMPREHENSIVE METABOLIC PANEL
ALT: 40 U/L — ABNORMAL HIGH (ref 0–35)
AST: 33 U/L (ref 0–37)
Albumin: 2.9 g/dL — ABNORMAL LOW (ref 3.5–5.2)
Alkaline Phosphatase: 103 U/L (ref 39–117)
BUN: 6 mg/dL (ref 6–23)
CO2: 26 mEq/L (ref 19–32)
Calcium: 8.8 mg/dL (ref 8.4–10.5)
Chloride: 107 mEq/L (ref 96–112)
Creatinine, Ser: 0.75 mg/dL (ref 0.4–1.2)
GFR calc Af Amer: >60 mL/min (ref >60)
GFR calc non Af Amer: >60 mL/min (ref >60)
Glucose, Bld: 124 mg/dL — ABNORMAL HIGH (ref 70–99)
Potassium: 4.1 mEq/L (ref 3.5–5.1)
Sodium: 140 mEq/L (ref 135–145)
Total Bilirubin: 0.2 mg/dL — ABNORMAL LOW (ref 0.3–1.2)
Total Protein: 6.8 g/dL (ref 6.0–8.3)

## 2010-04-02 LAB — MRSA PCR SCREENING: MRSA by PCR: NEGATIVE

## 2010-04-02 LAB — BASIC METABOLIC PANEL
BUN: 6 mg/dL (ref 6–23)
CO2: 26 mEq/L (ref 19–32)
Calcium: 9 mg/dL (ref 8.4–10.5)
Chloride: 101 mEq/L (ref 96–112)
Creatinine, Ser: 0.7 mg/dL (ref 0.4–1.2)
GFR calc Af Amer: >60 mL/min (ref >60)
GFR calc non Af Amer: >60 mL/min (ref >60)
Glucose, Bld: 120 mg/dL — ABNORMAL HIGH (ref 70–99)
Potassium: 3.4 mEq/L — ABNORMAL LOW (ref 3.5–5.1)
Sodium: 135 mEq/L (ref 135–145)

## 2010-04-03 LAB — BASIC METABOLIC PANEL
BUN: 7 mg/dL (ref 6–23)
BUN: 5 mg/dL — ABNORMAL LOW (ref 6–23)
CO2: 29 mEq/L (ref 19–32)
CO2: 26 mEq/L (ref 19–32)
Calcium: 9 mg/dL (ref 8.4–10.5)
Calcium: 9.2 mg/dL (ref 8.4–10.5)
Chloride: 105 mEq/L (ref 96–112)
Chloride: 102 mEq/L (ref 96–112)
Creatinine, Ser: 0.84 mg/dL (ref 0.4–1.2)
Creatinine, Ser: 0.87 mg/dL (ref 0.4–1.2)
GFR calc Af Amer: >60 mL/min (ref >60)
GFR calc Af Amer: >60 mL/min (ref >60)
GFR calc non Af Amer: >60 mL/min (ref >60)
GFR calc non Af Amer: >60 mL/min (ref >60)
Glucose, Bld: 156 mg/dL — ABNORMAL HIGH (ref 70–99)
Glucose, Bld: 119 mg/dL — ABNORMAL HIGH (ref 70–99)
Potassium: 3.9 mEq/L (ref 3.5–5.1)
Potassium: 4 mEq/L (ref 3.5–5.1)
Sodium: 140 mEq/L (ref 135–145)
Sodium: 138 mEq/L (ref 135–145)

## 2010-04-03 LAB — HEMOGLOBIN A1C
Hgb A1c MFr Bld: 7.3 % — ABNORMAL HIGH (ref <5.7)
Mean Plasma Glucose: 163 mg/dL — ABNORMAL HIGH (ref <117)

## 2010-04-03 LAB — CBC
HCT: 35.5 % — ABNORMAL LOW (ref 36.0–46.0)
Hemoglobin: 11.4 g/dL — ABNORMAL LOW (ref 12.0–15.0)
MCH: 25.2 pg — ABNORMAL LOW (ref 26.0–34.0)
MCHC: 32.1 g/dL (ref 30.0–36.0)
MCV: 78.4 fL (ref 78.0–100.0)
Platelets: 414 10*3/uL — ABNORMAL HIGH (ref 150–400)
RBC: 4.53 MIL/uL (ref 3.87–5.11)
RDW: 15.9 % — ABNORMAL HIGH (ref 11.5–15.5)
WBC: 11.2 10*3/uL — ABNORMAL HIGH (ref 4.0–10.5)

## 2010-04-03 LAB — IRON AND TIBC
Iron: 35 ug/dL — ABNORMAL LOW (ref 42–135)
Saturation Ratios: 13 % — ABNORMAL LOW (ref 20–55)
TIBC: 279 ug/dL (ref 250–470)
UIBC: 244 ug/dL

## 2010-04-03 LAB — FOLATE: Folate: 4 ng/mL

## 2010-04-03 LAB — DIFFERENTIAL
Basophils Absolute: 0 10*3/uL (ref 0.0–0.1)
Basophils Relative: 0 % (ref 0–1)
Eosinophils Absolute: 0.4 10*3/uL (ref 0.0–0.7)
Eosinophils Relative: 4 % (ref 0–5)
Lymphocytes Relative: 22 % (ref 12–46)
Lymphs Abs: 2.5 10*3/uL (ref 0.7–4.0)
Monocytes Absolute: 0.4 10*3/uL (ref 0.1–1.0)
Monocytes Relative: 4 % (ref 3–12)
Neutro Abs: 7.9 10*3/uL — ABNORMAL HIGH (ref 1.7–7.7)
Neutrophils Relative %: 70 % (ref 43–77)

## 2010-04-03 LAB — SEDIMENTATION RATE
Sed Rate: 61 mm/hr — ABNORMAL HIGH (ref 0–22)
Sed Rate: 61 mm/hr — ABNORMAL HIGH (ref 0–22)

## 2010-04-03 LAB — FERRITIN: Ferritin: 45 ng/mL (ref 10–291)

## 2010-04-03 LAB — VITAMIN B12: Vitamin B-12: 354 pg/mL (ref 211–911)

## 2010-04-03 LAB — VANCOMYCIN, TROUGH: Vancomycin Tr: 15.7 ug/mL (ref 10.0–20.0)

## 2010-04-04 DIAGNOSIS — N61 Mastitis without abscess: Secondary | ICD-10-CM

## 2010-04-04 HISTORY — DX: Mastitis without abscess: N61.0

## 2010-04-04 LAB — GLUCOSE, CAPILLARY
Glucose-Capillary: 81 mg/dL (ref 70–99)
Glucose-Capillary: 134 mg/dL — ABNORMAL HIGH (ref 70–99)
Glucose-Capillary: 120 mg/dL — ABNORMAL HIGH (ref 70–99)
Glucose-Capillary: 131 mg/dL — ABNORMAL HIGH (ref 70–99)

## 2010-04-04 LAB — VANCOMYCIN, TROUGH: Vancomycin Tr: 18.9 ug/mL (ref 10.0–20.0)

## 2010-04-04 NOTE — Discharge Summary (Addendum)
  NAMEKEYMANI, GLYNN              ACCOUNT NO.:  [id number]  MEDICAL RECORD NO.:  [id number]          PATIENT TYPE:  INP  LOCATION:  A332                          FACILITY:  [facility]  PHYSICIAN:  Pleas Koch, MD        DATE OF BIRTH:  16-May-1980  DATE OF ADMISSION:  03/29/2010 DATE OF DISCHARGE:  01/25/2012LH                              DISCHARGE SUMMARY   HISTORY OF PRESENT ILLNESS:  The patient was seen on the day of discharge and it was noted her vitals were temperature 98.3, pulse 83, respirations were 20, blood pressure 119-159 over 81-79.  IMPRESSION/PLAN: 1. I examined her breasts.  She has lumpy breasts.  No fluctuance in     the right breast; however, some mild tenderness in the inner lower     quadrant of the right breast.  She has completed three days of IV     antibiotics and is now on day two of Augmentin.  She is scheduled     for a mammogram today and will complete a 10-day course of full     antibiotics if this is negative as an outpatient.  If not, however,     we will consult Surgery for possible intervention. 2. Diabetes mellitus.  Her A1c is 7.3.  She will continue metformin     500 b.i.d.  She identifies Ephraim Hamburger as her primary care     physician in [place] and will need counseling to follow up     with that. 3. Vaginitis.  She will continue nystatin for two more days for her     vaginitis and will be reviewed. 4. Hypertension.  Verapamil will be continued as an outpatient.  She     may benefit from low-dose ACE in view of her diabetes but we will     leave this up to Dr. Skeet Simmer as an outpatient to determine this. 5. Bipolar.  She will continue Effexor.                                           ______________________________ Pleas Koch, MD     JS/MEDQ  D:  04/04/2010  T:  04/04/2010  Job:  [id number]  Electronically Signed by Pleas Koch MD on 04/04/2010 05:37:45 PM

## 2010-04-08 LAB — CONVERTED CEMR LAB
Bilirubin Urine: NEGATIVE
Blood in Urine, dipstick: NEGATIVE
Glucose, Urine, Semiquant: NEGATIVE
Ketones, urine, test strip: NEGATIVE
Nitrite: NEGATIVE
Protein, U semiquant: NEGATIVE
Specific Gravity, Urine: 1.015
Uric Acid, Serum: 5.7 mg/dL (ref 2.4–7.0)
Urobilinogen, UA: 0.2
pH: 7

## 2010-04-10 NOTE — Letter (Signed)
Summary: Records Release  Records Release   Imported By: Micah Flesher, LPN 81/19/1478 29:56:21  _____________________________________________________________________  External Attachment:    Type:   Image     Comment:   External Document

## 2010-04-10 NOTE — Progress Notes (Signed)
Summary: phone note/ medicine too expensive  Phone Note Call from Patient   Caller: Patient Summary of Call: Pt called and said the Pylera will costs $65.00 and she cannot afford. Please advise! Initial call taken by: Cloria Spring LPN,  January 09, 2010 9:13 AM     Appended Document: phone note/ medicine too expensive My only other recommendation would be to break up pylera generically.  Appended Document: phone note/ medicine too expensive called pharmacist at [facility], the 65.00 price is on the pylera, they have not tried to break rx into generics. Pharmacist stated he would do that and let pt know how much it is. pt aware

## 2010-04-10 NOTE — Letter (Signed)
Summary: RELEASE OF MEDICAL INFORMATION  RELEASE OF MEDICAL INFORMATION   Imported By: Rexene Alberts 10/12/2009 16:55:27  _____________________________________________________________________  External Attachment:    Type:   Image     Comment:   External Document

## 2010-04-10 NOTE — Progress Notes (Signed)
Summary: toothache  Phone Note Call from Patient   Caller: Patient Summary of Call: patient called with toothache, called her deentist and he is on vacation, she called his backup and was put on waiting list, but was told she may not get seen today. Donesn't need anything for pain, but wanted an antibotic. Advised pateint that I would send note, but providers do not give antibotics without being seen.  Advise Initial call taken by: Sonny Dandy,  September 26, 2006 9:02 AM  Follow-up for Phone Call        Need exam.Please work-in this pm. Follow-up by: Franchot Heidelberg MD,  September 26, 2006 10:52 AM  Additional Follow-up for Phone Call Additional follow up Details #1::        Patient will call back and advise if she can come in this afternoon Additional Follow-up by: Sonny Dandy,  September 26, 2006 12:11 PM

## 2010-04-10 NOTE — Progress Notes (Signed)
Summary: results of xray  Phone Note Call from Patient   Caller: Patient Call For: kim Summary of Call: Pt requesting results of xrays taken 05/09/06. Please call pt at [phone] Initial call taken by: Magdalene River,  May 09, 2006 4:39 PM  Follow-up for Phone Call        pt notified of xray results Follow-up by: Sherilyn Banker,  May 09, 2006 5:09 PM

## 2010-04-10 NOTE — Letter (Signed)
Summary: Radiology  Radiology   Imported By: Micah Flesher, LPN 28/41/3244 01:02:72  _____________________________________________________________________  External Attachment:    Type:   Image     Comment:   External Document

## 2010-04-10 NOTE — Letter (Signed)
Summary: Out of Work  [facility]  [address]   [place], [place] [address]   Phone: [phone]  Fax: [phone]    August 20, 2006   Employee:  [employer]    To Whom It May Concern:   For Medical reasons, please excuse the above named employee from work for the following dates:  Start:   08/20/06  End:   08/20/06  May return 08/21/06  If you need additional information, please feel free to contact our office.         Sincerely,    Franchot Heidelberg MD

## 2010-04-10 NOTE — Assessment & Plan Note (Signed)
Summary: headache   Vital Signs:  Patient Profile:   30 Years Old Female Height:     66 inches (167.64 cm) O2 Sat:      98 % O2 treatment:    Room Air Pulse rate:   89 / minute Resp:     9 per minute BP sitting:   114 / 64  (left arm)  Vitals Entered By: Lutricia Horsfall (November 25, 2007 8:29 AM)                 PCP:  Franchot Heidelberg, MD  Chief Complaint:  Headache.  History of Present Illness: Headache only improved for about 1 hour after toradol and metoclopramide 2 days ago.  She says she has been eating poorly but trying to drink fluid.      Current Allergies: ! 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 Alert and oriented X 3.     Impression & Recommendations:  Problem # 1:  HEADACHE (ICD-784.0) IV fluid given with IV metoclopramide and toradol with resolution of headache.  Prescription for metoclopramide oral given for as needed use.  She needs routine follow up with Dr. Erby Pian or myself in a few weeks to see if she needs prophylaxis.  Her updated medication list for this problem includes:    Ec-naprosyn 500 Mg Tbec (Naproxen) .Marland Kitchen..Marland Kitchen Two times a day  Orders: Metoclopramide hcl up to 10mg  (G4010) Ketorolac-Toradol 15mg  (U7253) Admin of Therapeutic Inj  intravenous (66440) Intravenous Infusion Therapy Diag 1 hr (34742)   Complete Medication List: 1)  Lyrica 50mg   .... One tablet tid 2)  Aciphex 20 Mg  Tbec (Rabeprazole sodium) .... One daily 3)  Pyridium 200 Mg Tabs (Phenazopyridine hcl) .... Three times a day 4)  Ec-naprosyn 500 Mg Tbec (Naproxen) .... Two times a day 5)  Apnea Link Screen  .... Snoring and night time choking. 6)  Bactrim Ds 800-160 Mg Tabs (Sulfamethoxazole-trimethoprim) .... Two times a day 7)  Pyridium 200 Mg Tabs (Phenazopyridine hcl) .... Three times a day 8)  Metoclopramide Hcl 10 Mg Tabs (Metoclopramide hcl) .Marland Kitchen.. 1 by mouth q 6 hours as needed headache    Prescriptions: METOCLOPRAMIDE HCL 10 MG TABS (METOCLOPRAMIDE HCL) 1 by mouth q 6 hours as needed headache  #40 x 0   Entered and Authorized by:   Erle Crocker MD   Signed by:   Erle Crocker MD on 11/25/2007   Method used:   Print then Give to Patient   RxID:   [id number]  ]  Medication Administration  Injection # 1:    Medication: Metoclopramide hcl up to 10mg     Diagnosis: HEADACHE (ICD-784.0)    Route: IV    Site: right anticubital  Exp Date: 11/09/2008    Lot #: [id number]    Mfr: hospira    Patient tolerated injection without complications    Given by: Lutricia Horsfall (November 25, 2007 8:57 AM)  Injection # 2:    Medication: Ketorolac-Toradol 15mg     Diagnosis: HEADACHE (ICD-784.0)    Route: IV    Site: right anticubital    Exp Date: 9/09    Lot #: [id number]    Mfr: baxter    Comments: 30mg  IVP per MD order    Patient tolerated injection without complications    Given by: Lutricia Horsfall (November 25, 2007 8:58 AM)  Infusion # 1:    Diagnosis: HEADACHE (ICD-784.0)    Started: 8:51 AM    Stopped: 10:15 AM    Solution: 0.9% NS    Instructions: WO    Route: IV infusion    Site: right anticubital    Ordered byJen Mow    Administered by: Sherrie Gardner-November 25, 2007 8:56 AM    Comments: IV start ------------------------------------------------------------ gauge 24 site right anticubital attempts x5 Pt. tolerated well. Positive blood return.  No s/sx  infiltration.     Patient tolerated infusion without complications  Orders Added: 1)  Metoclopramide hcl up to 10mg  [J2765] 2)  Ketorolac-Toradol 15mg  [J1885] 3)  Admin of Therapeutic Inj  intravenous [96374] 4)  Intravenous Infusion Therapy Diag 1 hr [96360] 5)  Est. Patient Level III [04540] IV dc'd. Cath intact. Zero s/sx infiltration.  Pt. tolerated well. Lutricia Horsfall  November 25, 2007 10:15 AM

## 2010-04-10 NOTE — Assessment & Plan Note (Signed)
Summary: follow up 1 mth/slj   Vital Signs:  Patient Profile:   30 Years Old Female Height:     66 inches (167.64 cm) Weight:      245 pounds BMI:     39.69 O2 Sat:      100 % Temp:     97.5 degrees F Pulse rate:   79 / minute Resp:     14 per minute BP sitting:   132 / 88  Vitals Entered By: Sherilyn Banker (May 08, 2007 3:24 PM)                 PCP:  Franchot Heidelberg, MD  Chief Complaint:  follow up visit.  History of Present Illness: Pt in for recheck.  She was seen recently seen due to UTI. This was treated and has resolved.   She states she had an ED visit earlier this week as she developed this Tuesday acutely. She states was aweful. She states the pain was in the pit of stomach and radiated to back. She did not have xrays but was given iv, nausea meds, Morphin (made her nauseated and vomit). She had the visit in [place] at [facility]. She was told still had a little UTI left and placed her on Macrobid. She has been using this and he also gave her some Ultram.  She states the pain has tepered off and rates as 8/10. She states pain is an uneasy feeling. Worse with  nothing, better with Ultram and rest. Her apetite is down. She has had some nasuea and denies vomitting. States Morphine did this earlier this week. She is having active period at this time and notes started yesterday morning. She denies diarrhea and constipation. No bloody stools. She does have GERD and is using Nexium.States helps a lot. She is s/p appnedectmy and cholecystectomy. SHe is s/p EGD last August and and was found to have GERD. Advised gastric emptying study if nausea and vomitting persist. Was suppoes to see Dr. Jena Gauss for follow-up but told jot to see until May. She has hx of 2 liver lesions and is supposed to have repeat MRI now. 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  She denies dysuria, frequency and nocturia. States thinks this has cleared.  She denies fever, chills and sweats.  Now  presents.  Hypertension History:      She denies headache, chest pain, palpitations, dyspnea with exertion, orthopnea, PND, peripheral edema, visual symptoms, neurologic problems, syncope, and side effects from treatment.  She notes no problems with any antihypertensive medication side effects.  Further comments include: Echo reviewed: Mod LVH with EF 60%, Aortic valve sclerosis. Othewise normal. Happy with this. Mild LA.        Positive major cardiovascular risk factors include hypertension.  Negative major cardiovascular risk factors include female age less than 3 years old and non-tobacco-user status.        Positive history for target organ damage include cardiac end organ damage (either CHF or LVH).       Prior Medications Reviewed Using: Patient Recall  Updated Prior Medication List: * LYRICA 50MG  one tablet tid ACIPHEX 20 MG  TBEC (RABEPRAZOLE SODIUM) One daily PYRIDIUM 200 MG  TABS (PHENAZOPYRIDINE HCL) three times a day EC-NAPROSYN 500 MG  TBEC (NAPROXEN) two times a day * APNEA LINK SCREEN Snoring and night time choking. BACTRIM DS 800-160 MG  TABS (SULFAMETHOXAZOLE-TRIMETHOPRIM) two times a day PYRIDIUM 200 MG  TABS (PHENAZOPYRIDINE HCL) three times a day  Current Allergies (reviewed  today): ! 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 Lovell Sheehan   Family History:    Reviewed history from 05/08/2006 and no changes required:       Father: Dead 29 Lung Cancer       Mother: Dead 2022/08/23 cervical cancer       Siblings: 3 Sisters: DM, 3 Brothers  W&L       Kids: [id number]  Social History:    Reviewed history from 05/08/2006 and no changes required:       Occupation: Psychologist, forensic        Married       Never Smoked       Alcohol use-no       Drug use-no   Risk Factors: Tobacco use:  never Drug use:  no Alcohol use:  no  Colonoscopy History:    Date of Last Colonoscopy:  10/21/2005  PAP Smear History:    Date of Last PAP Smear:  10/21/2005   Review of Systems      See HPI   Physical Exam  General:     Well-developed,well-nourished,in no acute distress; alert,appropriate and cooperative throughout examination. Obese.  Lungs:     Normal respiratory effort, chest expands symmetrically. Lungs are clear to auscultation, no crackles or wheezes. Heart:     Normal rate and regular rhythm. S1 and S2 normal without gallop, murmur, click, rub or other extra sounds. Abdomen:     Bowel sounds positive,abdomen soft and non-tender without masses, organomegaly or hernias noted. No CVA tenderness. Difficult exam but due to obesity. No obvious abnormality. Extremities:     No clubbing, cyanosis, edema, or deformity noted with normal full range of motion of all joints.   Cervical Nodes:     No lymphadenopathy noted Psych:     Cognition and judgment appear intact. Alert and cooperative with normal attention span and concentration. No apparent delusions, illusions, hallucinations    Impression & Recommendations:  Problem # 1:  VENTRICULAR HYPERTROPHY, LEFT (ICD-429.3) Discussed. Echo reviewed. Reassured. Advised diet, exersize and portion control for weight loss as this is likley cause. She is agreeable. Follow.  Problem # 2:  CYSTITIS, ACUTE (ICD-595.0) Get records [place]. Rx a is. Agrees. Her updated medication list for this problem includes:    Pyridium 200 Mg Tabs (Phenazopyridine hcl) .Marland Kitchen... Three times a day    Bactrim Ds 800-160 Mg Tabs (Sulfamethoxazole-trimethoprim) .Marland Kitchen..Marland Kitchen Two times a day    Pyridium 200 Mg Tabs (Phenazopyridine hcl) .Marland Kitchen... Three times a day   Problem # 3:  ESSENTIAL HYPERTENSION (ICD-401.9) Hx of. BP at goal without Rx. May consider  additional of low dose B-blokcer or Ace to optomize LVH. Will consider. Recheck 4 weeks.  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 Repeat per Radiology recommendations. Await report and optomize. She has normal abdominal exam despite report of severe pain. She states she called GI to get appt set for this exam and they tol her they would not do until May 2009. My concern would be factitious  sx to justify study. We will await report and optomize. Councelled rest, fluids and update if sx worsen. Ultram as per ED. Repeat ED eval if worse. Orders: Radiology Referral (Radiology)   Complete Medication List: 1)  Lyrica 50mg   .... One tablet tid 2)  Aciphex 20 Mg Tbec (Rabeprazole sodium) .... One daily 3)  Pyridium 200 Mg Tabs (Phenazopyridine hcl) .... Three times a day 4)  Ec-naprosyn 500 Mg Tbec (Naproxen) .... Two times a day 5)  Apnea Link Screen  .... Snoring and night time choking. 6)  Bactrim Ds 800-160 Mg Tabs (Sulfamethoxazole-trimethoprim) .... Two times a day 7)  Pyridium 200 Mg Tabs (Phenazopyridine hcl) .... Three times a day  Hypertension Assessment/Plan:      The patient's hypertensive risk group is category C: Target organ damage and/or diabetes.  Today's blood pressure is 132/88.  Her blood pressure goal is < 140/90.   Patient Instructions: 1)  Please schedule a follow-up appointment in 1 month.    ]  Appended Document: follow up 1 mth/slj recent [facility] ER records requested....03.03.09.Marland KitchenMarland Kitchenarc

## 2010-04-10 NOTE — Progress Notes (Signed)
Summary: 04/15/07 Echo results  Phone Note Outgoing Call   Call placed by: Sonny Dandy,  April 20, 2007 1:02 PM Summary of Call: message left for patient to return call .................................................................Marland KitchenMarland KitchenBritt Bottom Long  April 20, 2007 1:02 PM  Results given to patient, voices understanding  .................................................................Marland KitchenMarland KitchenBritt Bottom Long  April 20, 2007 1:05 PM  Initial call taken by: Sonny Dandy,  April 20, 2007 1:05 PM

## 2010-04-10 NOTE — Progress Notes (Signed)
Summary: wants another ortho provider  Phone Note Call from Patient   Caller: Patient Summary of Call: wants to see another ortho provider, per patient he is "not doing anythhing". Still c/o leg hurting. patient called, message left for patient to return call. needs recheck before new referral. Initial call taken by: Sonny Dandy,  January 06, 2007 2:26 PM  Follow-up for Phone Call        Agree. Follow-up by: Franchot Heidelberg MD,  January 06, 2007 2:35 PM

## 2010-04-10 NOTE — Progress Notes (Signed)
Summary: N & V   Phone Note Call from Patient   Caller: Patient Summary of Call: C/O  N&V since Friday, last time the vomited was Sunday pm. Said she thinks she needs fluids, advise Initial call taken by: Sonny Dandy,  September 29, 2006 8:45 AM  Follow-up for Phone Call        ED eval Follow-up by: Franchot Heidelberg MD,  September 29, 2006 9:06 AM  Additional Follow-up for Phone Call Additional follow up Details #1::        call to patient, voices understanding and will go to ER Additional Follow-up by: Sonny Dandy,  September 29, 2006 9:24 AM

## 2010-04-10 NOTE — Progress Notes (Signed)
Summary: pain rx  Phone Note Call from Patient Call back at Home Phone [phone]   Caller: Patient Summary of Call: pt called- she is having alot of abd pain and some nausea, no vomiting, diarrhea or constipation. no fever. She is having normal bms. Tylenol is not helping. pt has appt on 02/21/10. She is on her way to have recall labs done now. Wants to know if she can have an rx for pain to last untill her appt next week. please advise Initial call taken by: Hendricks Limes LPN,  February 16, 2010 9:34 AM     Appended Document: pain rx denied abd pain in the office  Appended Document: pain rx would offer Carafate suspension 1 gram QID X 5 days (disp 20 doses - no refills) . no narcotics  Appended Document: pain rx LM on voicemail and called rx to [facility]

## 2010-04-10 NOTE — Letter (Signed)
Summary: CT SCAN ABD/PELVIS ORDER  CT SCAN ABD/PELVIS ORDER   Imported By: Ave Filter 06/05/2009 08:58:38  _____________________________________________________________________  External Attachment:    Type:   Image     Comment:   External Document

## 2010-04-10 NOTE — Assessment & Plan Note (Signed)
Summary: Acute visit: Tooth Pain   Vital Signs:  Patient Profile:   30 Years Old Female Height:     66 inches (167.64 cm) Weight:      235 pounds BMI:     38.07 O2 Sat:      97 % Temp:     98.1 degrees F Pulse rate:   76 / minute Resp:     14 per minute BP sitting:   152 / 90  Pt. in pain?   yes    Location:   tooth    Intensity:   2    Type:       aching  Vitals Entered By: Sherilyn Banker (September 26, 2006 3:17 PM)                PCP:  Franchot Heidelberg, MD  Chief Complaint:  toothache.  History of Present Illness: Pt in for acute visit.  She has a hx of a broken tooth. She broke it a month ago. She sees Dr. Freddi Starr. She called him and was set to get it repaired. Missed this as he was on vacation. She will call Monday to get in and be seen for repair.  She notes she broke her tooth when eating candy bar. She rates her pain as so-so - using Ibuprofen. Notes eating food but stomach feels nauseated. Rates pain as 3 to 5 out of ten - 20 last night.  She denies fever, chills and sweats. She has a bad tatse in mouth and has felt a small knot come up on the gum line.  Now presents.  Hypertension History:      She denies headache, chest pain, palpitations, dyspnea with exertion, orthopnea, PND, peripheral edema, visual symptoms, neurologic problems, syncope, and side effects from treatment.  She notes no problems with any antihypertensive medication side effects.        Positive major cardiovascular risk factors include hypertension.  Negative major cardiovascular risk factors include female age less than 72 years old and non-tobacco-user status.     Current Allergies (reviewed today): ! 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 Lovell Sheehan   Family History:    Reviewed history from 05/08/2006 and no changes required:       Father: Dead 61 Lung Cancer       Mother: Dead 27-Aug-2022 cervical cancer       Siblings: 3 Sisters: DM, 3 Brothers  W&L       Kids: [id number]  Social History:    Reviewed history from 05/08/2006 and no changes required:       Occupation: Psychologist, forensic       Married       Never Smoked       Alcohol use-no       Drug use-no    Review of Systems      See HPI   Physical Exam  General:     Well-developed,well-nourished,in no acute distress; alert,appropriate and cooperative throughout examination Head:     Normocephalic and atraumatic without obvious abnormalities. No apparent alopecia or balding.  Eyes:     PERRLA Ears:     External ear exam shows no significant lesions or deformities.  Otoscopic examination reveals clear canals, tympanic membranes are intact bilaterally without bulging, retraction, inflammation or discharge. Hearing is grossly normal bilaterally. Nose:     External nasal examination shows no deformity or inflammation. Nasal mucosa are pink and moist without lesions or exudates. Mouth:     Poor dentition. Broken right upper incisor. Foul breath. Tender to touch. Neck:     No deformities, masses, or tenderness noted. Lungs:     Normal respiratory effort, chest expands symmetrically. Lungs are clear to auscultation, no crackles or wheezes. Heart:     Normal rate and regular rhythm. S1 and S2 normal without gallop, murmur, click, rub or other extra sounds. Cervical Nodes:     No lymphadenopathy noted    Impression & Recommendations:  Problem # 1:  DENTAL CARIES (ICD-521.00) Discussed. Advised proper dental care with brushing and flossing and use of mouth wash such as ACT. She will need to see dentisit next week to have tooth pulled and repaired. She will call for appt on Monday per her own request. Advised we will start Augmentin (875  mg by mouth bid for 10 days) for sx and will add Darvocet (2 pills every 6 hours as needed) for pain control. Paper-scripts given at time of eval due to EMR being off line. Councelled risk and benefit of med use. Agrees. If sx worsen despite Rx go to ED.  Hypertension Assessment/Plan:      The patient's hypertensive risk group is category A: No risk factors and no target organ damage.  Today's blood pressure is 152/90.  Her blood pressure goal is < 140/90.   Patient Instructions: 1)  As scheduled - sooner if needed.

## 2010-04-10 NOTE — Letter (Signed)
Summary: RELEASE OF MEDICAL INFORMATION  RELEASE OF MEDICAL INFORMATION   Imported By: Rexene Alberts 10/12/2009 16:57:27  _____________________________________________________________________  External Attachment:    Type:   Image     Comment:   External Document

## 2010-04-10 NOTE — Assessment & Plan Note (Signed)
Summary: workin/sick/arc   Vital Signs:  Patient profile:   30 year old female Height:      66 inches Weight:      248 pounds BMI:     40.17 O2 Sat:      96 % Temp:     98.5 degrees F Pulse rate:   72 / minute Resp:     14 per minute BP sitting:   116 / 80  Vitals Entered By: Sherilyn Banker LPN (May 30, 2008 2:50 PM)  Primary Provider:  Franchot Heidelberg, MD  CC:  Chest pain.  History of Present Illness: Pt in for acute visit.  She has chest pain. She states this started last Thursday and is constant. States restrosternal and goes to left arm, back and shoulder. States arms tingly. States sharp. Notes 7-8/10. Worse with deep breaths. Better with nothing. States when she sleeps does not feel pain. She denies cough and wheeze. She feels SOB with exertion. She denies palpitations. She reports leg swelling. She states apetite is low. She denies nausea and vomitting. She has not had constipation and has had lose stools for few days on and off since last week. She states no boood in stool. Has had to go two times a day. She states she usually goes to the bathroom once every other day. She denies ill contacts and travel. She is sleeping fair. She is using Unisom OTC and it helps. She is irritable per her spouse. Her concentration jumps all over the place. She states energy is low. She states just miserable. She is continuing her Citalopram and Amlodipine/Lisinopril. No side-effects on this. She states she does not feel stressed. States maybe little at most and notes finances are tight, hard to find job and she is worried about kids if she does find job - who will care for them.  She has never had cardology eval.  Last Echo 2009 - April   FINAL IMPRESSION:   1. Moderate left ventricular hypertrophy, ejection fraction 60       percent.   2. Mild left atrial enlargement (42 millimeters).   3. Aortic valve sclerosis.   4. Normal mitral valve.   5. No pericardiale effusion.   6. Normal right  ventricle.   She now presents.  Hypertension History:      Positive major cardiovascular risk factors include hypertension.  Negative major cardiovascular risk factors include female age less than 24 years old, no history of diabetes, negative family history for ischemic heart disease, and non-tobacco-user status.        Positive history for target organ damage include cardiac end organ damage (either CHF or LVH).  Further assessment for target organ damage reveals no history of ASHD, stroke/TIA, or peripheral vascular disease.     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 One Daily 2)  Celexa 20 Mg Tabs (  Citalopram Hydrobromide) .... One Daily 3)  Lisinopril 20 Mg Tabs (Lisinopril) .... One Daily  Allergies (verified): 1)  ! Vicodin  Past History:  Past Medical History:    OTHER SPECIFIED DISORDERS OF LIVER (ICD-573.8)    VENTRICULAR HYPERTROPHY, LEFT (ICD-429.3)    CYSTITIS, ACUTE (ICD-595.0)    COSTOCHONDRITIS, RECURRENT (ICD-733.6)    DENTAL CARIES (ICD-521.00)    OBESITY NOS (ICD-278.00)    ESSENTIAL HYPERTENSION (ICD-401.9)    HEMORRHOIDS, INTERNAL (ICD-455.0)    MIGRAINE HEADACHE (ICD-346.90)    CONSTIPATION NOS (ICD-564.00)    GERD (ICD-530.81)     (11/23/2007)  Past Surgical History:    removal of tumor from  left shoulder    Cholecystectomy    Tubal ligation    Appendectomy - 2008 - lower abdominal pain - Dr. Lovell Sheehan    Hysterectomy - June 2009 - no cancer cells    Epidurals low back - 2008 x 2 (02/12/2008)  Family History:    Father: Dead 75 Lung Cancer    Mother: Dead September 03, 2022 cervical cancer    Siblings: 3 Sisters: DM, 3 Brothers  W&L    Kids: [id number]     (05/08/2006)  Social History:    Occupation: Psychologist, forensic    Married    Never Smoked    Alcohol use-no    Drug use-no     (05/08/2006)  Risk Factors:    Alcohol Use: N/A    >5 drinks/d w/in last 3 months: N/A    Caffeine Use: N/A    Diet: N/A    Exercise: N/A  Risk Factors:    Smoking Status: never (05/08/2006)    Packs/Day: N/A    Cigars/wk: N/A    Pipe Use/wk: N/A    Cans of tobacco/wk: N/A    Passive Smoke Exposure: N/A  Review of Systems      See HPI  Physical Exam  General:  Obese, in no acute distress. Alert and oriented X 3. Flat affect. Neck:  No deformities, masses, or tenderness noted. Lungs:  Normal respiratory effort, chest expands symmetrically. Lungs are clear to auscultation, no crackles or wheezes. Heart:  Normal rate and regular rhythm. S1 and S2 normal without gallop, murmur, click, rub or other extra sounds. Abdomen:  Bowel sounds positive,abdomen soft and non-tender without masses, organomegaly or hernias noted. Extremities:  No clubbing, cyanosis, edema, or deformity noted with normal full range of motion of all joints.   Neurologic:  No cranial nerve deficits noted. Station and gait are normal. Plantar reflexes are down-going bilaterally. DTRs are symmetrical throughout. Sensory, motor and coordinative functions appear intact. Cervical Nodes:  No lymphadenopathy noted Psych:  Cognition and judgment appear intact. Alert and cooperative with normal attention span and concentration. No apparent delusions, illusions, hallucinations   Impression & Recommendations:  Problem # 1:  CHEST PAIN  (ICD-786.50) Discussed. Abdnormal EKG. Doubt cardiac etiology based on age, constant symptoms but in lieu of obesity and HTN will make referal for cardiac eval. I suspect her symptoms are related to anxiety/depression and she will cont Rx for now with Celexa. She was advised on stress coping skills and I gave her single dose of Hydroxyzine 50 mg IM today to calm her down some. I councelled spouse and her on stress coping skills. I thinks she would benefit from working as she is at home with multiple kids of varying age and this is a major stressor. We will await Cardiac eval and consider psych consult if cardiac eval negative. Agrees.  Orders:  EKG w/ Interpretation (93000) Cardiology Referral (Cardiology)  Problem # 2:  ANXIETY DEPRESSION (ICD-300.4)  See above.  Orders: Vistaril 25mg  (J3410) Admin of Therapeutic Inj  intramuscular or subcutaneous (04540)  Complete Medication List: 1)  Amlodipine Besylate 5 Mg Tabs (Amlodipine besylate) .... One daily 2)  Celexa 20 Mg Tabs (Citalopram hydrobromide) .... One daily 3)  Lisinopril 20 Mg Tabs (Lisinopril) .... One daily  Hypertension Assessment/Plan:      The patient's hypertensive risk group is category C: Target organ damage and/or diabetes.  Her calculated 10 year risk of coronary heart disease is 1 %.  Today's blood pressure is 116/80.  Her blood pressure goal is < 140/90.  Patient Instructions: 1)  Please schedule a follow-up appointment in 2 weeks. If symptoms worsen or persist, go to ED. Agrees.   EKG  Procedure date:  05/30/2008  Findings:      abnormal:  HR 74 Non-specific ST changes   EKG  Procedure date:  05/30/2008  Findings:      abnormal:  HR 74 Non-specific ST changes      Medication Administration  Injection # 1:    Medication: Vistaril 25mg     Diagnosis: ANXIETY DEPRESSION (ICD-300.4)    Route: IM    Site: L thigh    Mfr: novaplus    Patient tolerated injection without complications    Given by:  Sherilyn Banker LPN (May 30, 2008 3:56 PM)  Orders Added: 1)  EKG w/ Interpretation [93000] 2)  Est. Patient Level IV [98119] 3)  Cardiology Referral [Cardiology] 4)  Vistaril 25mg  [J3410] 5)  Admin of Therapeutic Inj  intramuscular or subcutaneous [14782]

## 2010-04-10 NOTE — Progress Notes (Signed)
Summary: Patient left ED without completion of eval  Phone Note Call from Patient   Summary of Call: patient called back after she went to er and left AMA stating, that she wasn't getting any help from anyone and she was tired of waiting there,  and that if she died she was going to be sueing a lot of people.  I told her that I didn't have the appt. she wanted to come in for any longer, and that I would have to talk to Dr. Erby Pian before I could put her on the schedule for today.  I placed her on hold and called to see if she went to the ER which she did to [facility] and they dx her with muscle skelton chest wall pain.  I called them back to ask if they had ordered her an xray and they told me she left AMA they asked where I was  calling from and if Dr. Erby Pian could talk to the Dr. I said yes, I will get him. Initial call taken by: Curtis Sites,  October 20, 2008 11:44 AM  Follow-up for Phone Call        See my phone note. Follow-up by: Franchot Heidelberg MD,  October 20, 2008 11:55 AM

## 2010-04-10 NOTE — Progress Notes (Signed)
Summary: phone message  Phone Note Call from Patient   Caller: Patient Summary of Call: Pt called and said she was seen here this AM for abd pain. She said we are supposed to get her records. She is scheduled  to come back 07/05/2009. She wants to know if she has to wait til her return visit before she can have something for pain. Said she really needs something now. (Uses [facility]) Initial call taken by: Cloria Spring LPN,  May 31, 2009 2:25 PM     Appended Document: phone message she complained of only abdominal swelling when she was here. Need to get old records. Recommend probiotic is previously suggested. Further recommendations to follow  Appended Document: phone message Pt informed, she was given samples at appt today.

## 2010-04-10 NOTE — Letter (Signed)
Summary: hand and rehab.  [facility].   Imported By: Curtis Sites 02/18/2008 15:30:41  _____________________________________________________________________  External Attachment:    Type:   Image     Comment:   External Document

## 2010-04-10 NOTE — Miscellaneous (Signed)
Summary: Orders Update  Clinical Lists Changes  Orders: Added new Test order of T-CBC w/Diff [phone]) - Signed Added new Test order of T-Comprehensive Metabolic Panel [phone]) - Signed Added new Test order of T-Lipase [phone]) - Signed Added new Test order of T-Urinalysis ([id number]) - Signed

## 2010-04-10 NOTE — Progress Notes (Signed)
Summary: Nausea and fullness over pelvis  Phone Note Call from Patient   Reason for Call: Talk to Nurse Summary of Call: PATIENT IS HAVING SWELLING FROM WAIST DOWN AND STARTED THIS MORNING...painful/ feel like she is getting sick....PLEASE ADVISE....  [phone] Initial call taken by: [facility]  Follow-up for Phone Call        Closed for this evening. If concerned need to go to ED. Otherwise come in in am.  Follow-up by: Franchot Heidelberg, MD  Additional Follow-up for Phone Call Additional follow up Details #1::        Appt Scheduled for tomorrow at 0845, also advised to go to ER if her symptoms persist.... Additional Follow-up by: Donneta Romberg,  August 19, 2006 5:08 PM

## 2010-04-10 NOTE — Letter (Signed)
Summary: gastroenterology note  gastroenterology note   Imported By: Magdalene River 05/12/2006 12:41:24  _____________________________________________________________________  External Attachment:    Type:   Image     Comment:   External Document

## 2010-04-10 NOTE — Letter (Signed)
Summary: Flow Sheet  Flow Sheet   Imported By: Micah Flesher, LPN 16/12/9602 54:09:81  _____________________________________________________________________  External Attachment:    Type:   Image     Comment:   External Document

## 2010-04-10 NOTE — Assessment & Plan Note (Signed)
Summary: LEFT HIP PAIN   Vital Signs:  Patient Profile:   30 Years Old Female Height:     66 inches (167.64 cm) Weight:      235 pounds BMI:     38.07 O2 Sat:      95 % Temp:     98.0 degrees F Pulse rate:   84 / minute Resp:     14 per minute BP sitting:   132 / 93  Vitals Entered By: Sherilyn Banker (September 19, 2006 10:29 AM)               PCP:  Franchot Heidelberg, MD  Chief Complaint:  L hip pain and throwing up since yesterday.  History of Present Illness: Pt in for recheck.  She notes she is sick. SHe started getting nauseated yesterday. She has thrown up a ton. She states she is drinking some but not eating. She denies fever andsweats but has had some chills. Apetite is down. Vomit is yellow without blood and she describes it as bile looking. She has some episgastric discomfort and notes it was just an ache, now leveled off and feels like a soreness. She denies darrhea and constipation but notes stools have been little loser. She deneis heartburn. On Aciphex - doing well. She has no appendix or gallbladder and notes she has been having the vomitting for a few months. This is not isolated.   She is also notes her left hop still hurts and she has had some leg swelling. She has some ankle pain. States she used the motrin and this is not helping. She describes her hip pain as a terrible ache. Pain worse with motion and better with rest. Ankle feels its getting better but it still hurts. No falls or injuries. She has had xrays on her left hip and this showed no acute findings. Her left ankle filsm showed soft tissue swelling but no fracture.   Now presents.  Hypertension History:      She denies headache, chest pain, palpitations, dyspnea with exertion, orthopnea, PND, peripheral edema, visual symptoms, neurologic problems, syncope, and side effects from treatment.  She notes no problems with any antihypertensive medication side effects.  Further comments include: Thinks BP high as she  does not feel well.        Positive major cardiovascular risk factors include hypertension.  Negative major cardiovascular risk factors include female age less than 72 years old and non-tobacco-user status.     Current Allergies (reviewed today): ! 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 Lovell Sheehan   Family History:    Reviewed history from 05/08/2006 and no changes required:       Father: Dead 78 Lung Cancer       Mother: Dead 2022/09/05 cervical cancer       Siblings: 3 Sisters: DM, 3 Brothers  W&L       Kids: [id number]  Social History:    Reviewed history from 05/08/2006 and no changes required:       Occupation: Psychologist, forensic       Married  Never Smoked       Alcohol use-no       Drug use-no    Review of Systems      See HPI  General      Denies fatigue and malaise.  Resp      Denies cough, shortness of breath, sputum productive, and wheezing.  GI      See HPI  GU      Denies abnormal vaginal bleeding, nocturia, urinary frequency, and urinary hesitancy.   Physical Exam  General:     Well-developed,well-nourished,in no acute distress; alert,appropriate and cooperative throughout examination. Obese. Lungs:     Normal respiratory effort, chest expands symmetrically. Lungs are clear to auscultation, no crackles or wheezes. Heart:     Normal rate and regular rhythm. S1 and S2 normal without gallop, murmur, click, rub or other extra sounds. Abdomen:     Soft, tender in epigastrium, no rebound, no masses, BS + Extremities:     No clubbing, cyanosis, edema, or deformity noted with normal full range of motion of all joints.  She does report pain in left hip with felxion and  external rotation.  Psych:     Cognition and judgment appear intact. Alert and cooperative with normal attention span and concentration. No apparent delusions, illusions, hallucinations    Impression & Recommendations:  Problem # 1:  ABDOMINAL PAIN, EPIGASTRIC (ICD-789.06) Discussed. She has mod bili in urine. We will draw labs as below. She was advised on neg preg test. No findings of UTI. We will do abd u/s and get GI eval given recurrence of sx. Doubt GERD given sx improvement with Aciphex. Consider gastritis, billiary abnormalities and liver dysfcuntion. Orders: T-Comprehensive Metabolic Panel [id number]) T-Amylase [id number]) T-CBC w/Diff [id number]) T-Lipase [id number]) UA Dipstick w/o Micro (28413) Urine Pregnancy Test  (24401)  Her updated medication list for this problem includes:    Reglan 10 Mg Tabs (Metoclopramide hcl) ..... One every 6 hours as needed   Problem # 2:  NAUSEA AND VOMITING (ICD-787.01) See above. Trial Reglan. Work-excuse for today. Clear liquid diet, saltine crackers with advance as tolerated. Push fluids such as gatoraide. Orders: T-Comprehensive Metabolic Panel ([id number])   Problem # 3:  HIP PAIN, LEFT (ICD-719.45) Ortho consult. Her updated medication list for this problem includes:    Ultram Er 100 Mg Tb24 (Tramadol hcl) ..... Once daily    Motrin 800 Mg Tabs (Ibuprofen) ..... One three times a day for five days   Problem # 4:  ANKLE PAIN, LEFT (ICD-719.47) Ortho consult.  Problem # 5:  GERD (ICD-530.81) Stable. Aciphex as is. Her updated medication list for this problem includes:    Aciphex 20 Mg Tbec (Rabeprazole sodium) ..... One daily   Problem # 6:  OBESITY NOS (ICD-278.00) Councelled diet, exersize and weight loss with portion control a must.  Problem # 7:  ESSENTIAL HYPERTENSION (ICD-401.9) Pt does not want medication. Protein on UA. Will give 6 weeks of diet and exersize and then consider ACE use.   Problem # 8:   Disposition Off work today - await labs and abd [place]. Consult GI and Ortho.  Medications Added to Medication List This Visit: 1)  Aciphex 20 Mg Tbec (Rabeprazole sodium) .... One daily 2)  Reglan 10 Mg Tabs (Metoclopramide hcl) .... One every 6 hours as needed  Hypertension Assessment/Plan:      The patient's hypertensive risk group is category A: No risk factors and no target organ damage.  Today's blood pressure is 132/93.  Her  blood pressure goal is < 140/90.   Patient Instructions: 1)  Please schedule a follow-up appointment in 6 weeks. If sx worsen over weekend, go to ED.    Prescriptions: REGLAN 10 MG  TABS (METOCLOPRAMIDE HCL) One every 6 hours as needed  #60 x 0   Entered and Authorized by:   Franchot Heidelberg MD   Signed by:   Franchot Heidelberg MD on 09/19/2006   Method used:   Print then Give to Patient   RxID:   [id number]       Laboratory Results   Urine Tests  Date/Time Recieved: September 19, 2006 11:29 AM  Date/Time Reported: September 19, 2006 11:30 AM   Routine Urinalysis   Color: yellow Appearance: Clear Glucose: negative   (Normal Range: Negative) Bilirubin: moderate   (Normal Range: Negative) Ketone: smal (15)   (Normal Range: Negative) Spec. Gravity: 1.020   (Normal Range: 1.003-1.035) Blood: trace-intact   (Normal Range: Negative) pH: 6.0   (Normal Range: 5.0-8.0) Protein: 100   (Normal Range: Negative) Urobilinogen: 1.0   (Normal Range: 0-1) Nitrite: negative   (Normal Range: Negative) Leukocyte Esterace: negative   (Normal Range: Negative)    Urine HCG: negative

## 2010-04-10 NOTE — Progress Notes (Signed)
  Phone Note Call from Patient Call back at [facility] Phone [phone]   Caller: Patient Summary of Call: PT WANTS TO KNOW IF TOPAMAX WILL HELP WITH WEIGHT LOSS, WAS PRESCIBED THIS BY NEUROLOGIST FOR HA, AND HE TOLD HER IT WOULD HELP, BUT SHE STILL INSISTED TO ASK DR. FERREIRA'S OPINION Initial call taken by: Chipper Herb,  November 21, 2008 2:00 PM  Follow-up for Phone Call        Called pt back but she could not talk at that time. Awaiting return call. Follow-up by: Sherilyn Banker LPN,  November 21, 2008 2:31 PM  Additional Follow-up for Phone Call Additional follow up Details #1::        pt coming tomorrow morning. We will discuss at office visit. Additional Follow-up by: Sherilyn Banker LPN,  November 23, 2008 9:43 AM

## 2010-04-10 NOTE — Letter (Signed)
Summary: Vitals  Vitals   Imported By: Micah Flesher, LPN 48/54/6270 35:00:93  _____________________________________________________________________  External Attachment:    Type:   Image     Comment:   External Document

## 2010-04-10 NOTE — Letter (Signed)
Summary: METRONIDAZOLE 250MG   METRONIDAZOLE 250MG    Imported By: Rexene Alberts 01/08/2010 15:41:33  _____________________________________________________________________  External Attachment:    Type:   Image     Comment:   External Document  Appended Document: METRONIDAZOLE 250MG  Please let pharmacy know ok to substitute as planned for HP treatment.  Appended Document: METRONIDAZOLE 250MG  Informed Robert at the pharmacy.

## 2010-04-10 NOTE — Assessment & Plan Note (Signed)
Summary: DISCUSS PHYSICAL THERAPY/THAT IS WHAT [facility] TOLD PT/SLJ   Vital Signs:  Patient Profile:   30 Years Old Female Height:     66 inches (167.64 cm) Weight:      254 pounds BMI:     41.14 O2 Sat:      100 % Pulse rate:   67 / minute Resp:     10 per minute BP sitting:   161 / 91  Vitals Entered By: Brenda Richardson (February 12, 2008 10:33 AM)                 PCP:  Franchot Heidelberg, MD  Chief Complaint:  LBP.  History of Present Illness: Pt in for acute visit.  She went [facility] as her husband is undergoing PT for his recent back surgery there.   She has low back pain and has had for over a year. SHe saw Dr. Romeo Apple at the time and had MRI done. This is reviewed from August 2008 -  Small central disc protrusion, L5-S1, without neural impingement. Mild disc degeneration at L1-2 and mild facet arthropathy at L4-5 without spinal stenosis.  She was then refered for epidurals and she did this in [place]. Had two shots in series of three and eveloped ithcing. The first did great but she had a reaction after second - likley allergy and took Benadryl and symptoms cleared. She never went back after this to see Dr. Eustaquio Maize.  She states her back has been hurting the last month. States low back. She states pain is sharp and stabbing. Seems to go down left leg. Rates as 6/10. She has used some of husbands Flexeril and states minimal relief. She states no weakness or incontinence and she states her left leg seems to be swollen at times.  She wears a size tripple D bra and she states back hurts a lot at end of day. She has been advised possible breast reduction as this may even benefit. She has thought about it but scared. She wears bras from [employer] and se has had professional fittings.   She now presents.      Prior Medications Reviewed Using: Patient Recall  Updated Prior Medication List: No Medications Current Allergies (reviewed today): !  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)       VENTRICULAR HYPERTROPHY, LEFT (ICD-429.3)       CYSTITIS, ACUTE (ICD-595.0)       COSTOCHONDRITIS, RECURRENT (ICD-733.6)       DENTAL CARIES (ICD-521.00)       OBESITY NOS (ICD-278.00)       ESSENTIAL HYPERTENSION (ICD-401.9)       HEMORRHOIDS, INTERNAL (ICD-455.0)       MIGRAINE HEADACHE (ICD-346.90)       CONSTIPATION NOS (ICD-564.00)       GERD (ICD-530.81)         Past Surgical History:    Reviewed history from 06/17/2006 and no changes required:       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 Lovell Sheehan       Hysterectomy - June 2009 - no cancer cells       Epidurals low  back - 2008 x 2     Review of Systems      See HPI   Physical Exam  General:     Obese, in no acute distress. Alert and oriented X 3.  Lungs:     Normal respiratory effort, chest expands symmetrically. Lungs are clear to auscultation, no crackles or wheezes. Heart:     Normal rate and regular rhythm. S1 and S2 normal without gallop, murmur, click, rub or other extra sounds. Abdomen:     Bowel sounds positive,abdomen soft and non-tender without masses, organomegaly or hernias noted. No CVA tenderness. Difficult exam due to obesity. No obvious abnormality. Extremities:     No clubbing, cyanosis, edema, or deformity noted with normal full range of motion of all joints.   Neurologic:     alert & oriented X3, cranial nerves II-XII intact, strength normal in all extremities, sensation intact to light touch, gait normal, and DTRs symmetrical and normal.  Reports low back pain on SLT at 60 degrees. Psych:     Cognition and judgment appear intact. Alert and cooperative with normal attention span and concentration. No apparent delusions, illusions,  hallucinations    Impression & Recommendations:  Problem # 1:  LOW BACK PAIN, MILD (ICD-724.2) Agree with PT eval. Councelled weight loss,possible breast reduction. May use OTC Tylenol for pain and discomfot in liu of BP. Advsied heating pad and councelled agaisnt use of spouse's meds. Await PT input and optomize.  The following medications were removed from the medication list:    Ec-naprosyn 500 Mg Tbec (Naproxen) .Marland Kitchen..Marland Kitchen Two times a day  Orders: Physical Therapy Referral (PT)   Problem # 2:  ELEVATED BLOOD PRESSURE WITHOUT DIAGNOSIS OF HYPERTENSION (ICD-796.2) Discussed. Handout on TLC given. Advised low salt intake and exersize.  Problem # 3:  OBESITY NOS (ICD-278.00) Aware of how this plays into symptoms. Councelled diet, exersze and portion control.   Problem # 4:  Preventive Health Care (ICD-V70.0) Update H1N1 vaccine. Advised risk and benefir and ave VIS. Update if any cocnerns. Agrees.  Other Orders: Influenza A (H1N1) w/ Phy couseling (E4540)   Patient Instructions: 1)  Please schedule a follow-up appointment in 1 month.   ]  Appended Document: H1N1    Clinical Lists Changes  Observations: Added new observation of H1N1 #1 VIS: given (02/12/2008 11:05) Added new observation of H1N1 #1 EXP: 6/10 (02/12/2008 11:05) Added new observation of H1N1 #1 MFR: CSL (02/12/2008 11:05) Added new observation of H1N1 #1 LOT: [id number] a (02/12/2008 11:05) Added new observation of H1N1 #1 RTE: IM (02/12/2008 11:05) Added new observation of H1N1 #1 SITE: L deltoid (02/12/2008 11:05) Added new observation of H1N1#1 VAC: given (02/12/2008 11:05)

## 2010-04-10 NOTE — Letter (Signed)
Summary: LABS  LABS   Imported By: Rexene Alberts 12/26/2009 15:40:37  _____________________________________________________________________  External Attachment:    Type:   Image     Comment:   External Document  Appended Document: LABS Pt called.  Labs reviewed.  Results given to pt by Gerrit Halls, NP.

## 2010-04-10 NOTE — Miscellaneous (Signed)
Summary: Orders Update MRI ABD  Clinical Lists Changes  Problems: Added new problem of LIVER MASS (ICD-235.3) Orders: Added new Test order of MRI (MRI) - Signed

## 2010-04-10 NOTE — Progress Notes (Signed)
Summary: Klonopin  Phone Note Call from Patient   Summary of Call: Pt wants to know if she needs to take the Klonopin q8hrs like Dr Cathey Endow had advised. She is going to call back and make appt as soon as she finds out husbands schedule. Initial call taken by: Sherilyn Banker LPN,  June 20, 2008 3:42 PM  Follow-up for Phone Call        May do short term. Follow-up by: Franchot Heidelberg MD,  June 20, 2008 3:51 PM  Additional Follow-up for Phone Call Additional follow up Details #1::        pt notified. Additional Follow-up by: Sherilyn Banker LPN,  June 20, 2008 4:01 PM

## 2010-04-10 NOTE — Progress Notes (Signed)
Summary: WANTS LAB RESULTS/ARC  Phone Note Call from Patient   Reason for Call: Talk to Nurse Summary of Call: PATIENT OVER SLEPT AND  MISSED HER APPT TODAY...WANTS TO CHECK ON LAB WORK.  PLEASE CALL BACK WITH RESULTS.Marland KitchenMarland KitchenARC Initial call taken by: Donneta Romberg,  August 22, 2006 9:34 AM  Follow-up for Phone Call        results given, pt will call Monday if no better Follow-up by: Sonny Dandy,  August 22, 2006 10:11 AM

## 2010-04-10 NOTE — Letter (Signed)
Summary: LABS FROM [facility]  LABS FROM [facility]   Imported By: Rexene Alberts 01/03/2010 13:54:02  _____________________________________________________________________  External Attachment:    Type:   Image     Comment:   External Document

## 2010-04-10 NOTE — Letter (Signed)
Summary: Referrals  Referrals   Imported By: Micah Flesher, LPN 16/12/9602 54:09:81  _____________________________________________________________________  External Attachment:    Type:   Image     Comment:   External Document

## 2010-04-10 NOTE — Progress Notes (Signed)
Summary: pt c/o abd pain  Phone Note Call from Patient   Caller: Patient Summary of Call: Pt left VM that she is taking the probiotics and they have helped the gas. But says she has fluid on her stomach and she is  having abd pain that is not relieved with Motrin and Tylenol. Said she did not want to go to the ER because it would be a  waste of her time. Please advise! Initial call taken by: Cloria Spring LPN,  June 02, 2009 3:26 PM     Appended Document: pt c/o abd pain need CT films from [facility] to review. If she has ongoing discomfort we probably need to go and do a CT of the abdomen and pelvis here with IV and oral contrast. Please schedule for the first of next week.  Pain worsens, she to go to the ED  Appended Document: pt c/o abd pain LMOM to call.  Appended Document: pt c/o abd pain Pt called and was infomed. Said she is at the ED now.  Appended Document: pt c/o abd pain Pt has an appt. for a CT Scan 06/06/09@11 :45a.m.  Pt's husband aware of appt.

## 2010-04-10 NOTE — Miscellaneous (Signed)
Summary: Orders Update  Clinical Lists Changes  Orders: Added new Test order of T-Creatine (16109) - Signed

## 2010-04-10 NOTE — Letter (Signed)
Summary: RECORDS FROM [facility]  RECORDS FROM [facility]   Imported By: Diana Eves 06/12/2009 16:32:52  _____________________________________________________________________  External Attachment:    Type:   Image     Comment:   External Document

## 2010-04-10 NOTE — Progress Notes (Signed)
Summary: Follow up on illness  Phone Note Call from Patient   Caller: Patient Summary of Call: call from patient stating she is no better. Advised that it would take 3 day for the antibotic and 10 -14 days before she is feeling better. Advised Ibuprofen q4-6hr for body aches. Initial call taken by: Sonny Dandy,  December 19, 2006 8:21 AM  Follow-up for Phone Call        Agree. Follow-up by: Franchot Heidelberg MD,  December 19, 2006 8:36 AM

## 2010-04-10 NOTE — Progress Notes (Signed)
Summary: please advise  Phone Note Call from Patient   Summary of Call: patient is trying to sign up for disability and has a few questions about it.  if you could please call her at [phone].  she states she has to send this out before her appointment on Friday.   Initial call taken by: Curtis Sites,  Jul 11, 2008 8:06 AM  Follow-up for Phone Call        Called pt, no answer. Follow-up by: Sherilyn Banker LPN,  Jul 11, 7033 2:30 PM  Additional Follow-up for Phone Call Additional follow up Details #1::        pt called to let us know that sometype of letter was being sent to [place]. Additional Follow-up by: Sherilyn Banker LPN,  Jul 12, 91 3:25 PM
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
## 2010-04-10 NOTE — Progress Notes (Signed)
Summary: request for work excuse  Phone Note Call from Patient   Caller: Patient Summary of Call: wants a 2 week note off from work, says she has "too much going on". Sched for colonoscopy and ortho has her scheduled for something also. I did explain that you might not write the excuse and she understood. Initial call taken by: Sonny Dandy,  October 13, 2006 1:08 PM  Follow-up for Phone Call        Needs to get excuse from MDs who ordered additional tests. Follow-up by: Franchot Heidelberg MD,  October 13, 2006 1:14 PM  Additional Follow-up for Phone Call Additional follow up Details #1::        spoke with patient, voices understanding  Additional Follow-up by: Sonny Dandy,  October 13, 2006 3:05 PM

## 2010-04-10 NOTE — Progress Notes (Signed)
Summary: Sick OV scheduled  Phone Note Call from Patient   Caller: Patient Summary of Call: would like for you to call her back, she feels awful. what can she take for a sore throat, back and legs aching and chest hurting, fever last night, took Mucinex D and nyquil last night. it hit her all of a sudden, she feels rough.  [phone] Initial call taken by: Curtis Sites,  March 25, 2007 11:28 AM  Follow-up for Phone Call        24 hour hx of sore throat and body aches. Temp last night was 100.45F. Chest hurts when she coughs, advised increase fluids, continue Musinex D and tylenol for increase in temp and body aches. voices understanding. Appointment made for patient on 03/26/07 at 330pm. Follow-up by: Sonny Dandy, LPN  Additional Follow-up for Phone Call Additional follow up Details #1::        Agree - if sx worsen, go to ED or Urgent Care. See if she would like to come in at [phone] Additional Follow-up by: Franchot Heidelberg MD,  March 25, 2007 12:10 PM    Additional Follow-up for Phone Call Additional follow up Details #2::    Patient had another appointemnt and will keep the afternoon appointment Follow-up by: Sonny Dandy,  March 25, 2007 1:01 PM

## 2010-04-10 NOTE — Letter (Signed)
Summary: Correspondence  Correspondence   Imported By: Micah Flesher, LPN 16/12/9602 54:09:81  _____________________________________________________________________  External Attachment:    Type:   Image     Comment:   External Document

## 2010-04-10 NOTE — Letter (Signed)
Summary: Out of Work  [facility]  [address]   [place], [place] [address]   Phone: [phone]  Fax: [phone]    December 15, 2006   Employee:  LATORYA BAUTCH    To Whom It May Concern:   For Medical reasons, please excuse the above named employee from work for the following dates:  Start:   12/15/06  End:   12/15/06  May return to work 12/16/06  If you need additional information, please feel free to contact our office.         Sincerely,         Franchot Heidelberg MD

## 2010-04-10 NOTE — Progress Notes (Signed)
Summary: patient sick x1 week  Phone Note Call from Patient   Reason for Call: Talk to Nurse Summary of Call: patient is requesting you call her back, she said she has been sick since last week and we don't have any available appts for the week...  please advise-[phone] Initial call taken by: Donneta Romberg,  May 30, 2008 8:16 AM  Follow-up for Phone Call        pt coming in at 3:00 today Follow-up by: Sherilyn Banker LPN,  May 30, 2008 8:28 AM

## 2010-04-10 NOTE — Progress Notes (Signed)
Summary: 09/20/06 lab results  Phone Note Outgoing Call   Call placed by: Sonny Dandy,  September 22, 2006 10:38 AM Summary of Call: called, no answer, message left for patient to return call on answering machine .................................................................Marland KitchenMarland KitchenSonny Dandy  September 22, 2006 10:39 AM  Results given to patient, voices understanding .................................................................Marland KitchenMarland KitchenSonny Dandy  September 22, 2006 10:50 AM  Initial call taken by: Sonny Dandy,  September 22, 2006 10:50 AM

## 2010-04-10 NOTE — Progress Notes (Signed)
Summary: Call from patient in ER  Phone Note Call from Patient   Caller: Patient Reason for Call: Talk to Doctor Summary of Call: Pt called from ER asking for Dr Erby Pian to call the ER and order her a chest xray.  Pt says she has been waiting in the ER for 2 hrs.  and is getting frustrated.  Told pt that she is currently under the care of the ER and they will do what is indicated for her.  Pt hungup the phone. Initial call taken by: Micah Flesher, LPN,  October 20, 2008 11:22 AM     Appended Document: Call from ER Pt called saying she is having SOB,  ~0930. Advised pt to go to ER.  Said that she would.

## 2010-04-10 NOTE — Assessment & Plan Note (Signed)
    PCP:  Brenda Heidelberg, MD   History of Present Illness: I saw Brenda Richardson in the office today for a followup visit.  She is a 30 years old woman with the complaint of:  back pain. Had 2 esi's didn't help much 10 percent better. Had rxn after 2nd shot,itching throat burning, took benadryl helped. Not going to get 3rd injection.  Current Allergies: ! Brenda Richardson           ]

## 2010-04-10 NOTE — Letter (Signed)
Summary: [facility] Discharge Summary  [facility] Discharge Summary   Imported By: Curtis Sites 10/21/2008 13:57:47  _____________________________________________________________________  External Attachment:    Type:   Image     Comment:   External Document

## 2010-04-10 NOTE — Letter (Signed)
Summary: Progress Note  Progress Note   Imported By: Micah Flesher, LPN 28/41/3244 01:02:72  _____________________________________________________________________  External Attachment:    Type:   Image     Comment:   External Document

## 2010-04-10 NOTE — Progress Notes (Signed)
Summary: 12/15/06 wet prep results  Phone Note Outgoing Call   Call placed by: Sonny Dandy,  December 16, 2006 2:20 PM Summary of Call: Results given to patient, voices understanding .................................................................Marland KitchenMarland KitchenSonny Dandy  December 16, 2006 2:20 PM   Initial call taken by: Sonny Dandy,  December 16, 2006 2:20 PM

## 2010-04-10 NOTE — Miscellaneous (Signed)
Summary: Orders Update  Clinical Lists Changes  Orders: Added new Test order of T-CBC w/Diff ([id number]) - Signed 

## 2010-04-10 NOTE — Assessment & Plan Note (Signed)
Summary: FOLLOW UP 2 WEEK/SLJ   Vital Signs:  Patient profile:   30 year old female Height:      66 inches Weight:      251 pounds BMI:     40.66 O2 Sat:      98 % Temp:     98.4 degrees F Pulse rate:   70 / minute Resp:     14 per minute BP sitting:   140 / 96  Vitals Entered By: Sherilyn Banker LPN (June 17, 1608 8:28 AM)  Nutrition Counseling: Patient's BMI is greater than 25 and therefore counseled on weight management options. CC: Chest pain, Hypertension Management Is Patient Diabetic? No Nutritional Status BMI of > 30 = obese   Primary Provider:  Franchot Heidelberg, MD  CC:  Chest pain and Hypertension Management.  History of Present Illness: Pt in for recheck.  She has had chest pain on and off and she was referred for Cardiology eval.   This is reviewed in Echart -  ASSESSMENT: 1. Noncoronary chest pain. 2. No evidence of pulmonary emboli by recent CT scan. 3. Probable musculoskeletal pain, rule out pericarditis. 4. Hypertension, under good control. 5. Obesity.   PLAN:  2-D echocardiogram.  If this is negative, we will see her back on a p.r.n. basis.  If it is musculoskeletal, we have told her to take anti- inflammatories, use heat, and avoid exacerbating factors.   Her Echo is reviewed:  SUMMARY -  Overall left ventricular systolic function was normal. Left       ventricular ejection fraction was estimated to be 60 %. There       were no left ventricular regional wall motion abnormalities.       There was mild focal basal septal hypertrophy.  She has very large breasts and suspect chest pain from this. She has pain over her sternum and the sides. Radiates to neck and sometimes left breast. She has pain almost every day. States worse with deep breatsh adn arm use. Betetr with sleeping as she does not feel pain then. She notes was given Percocet at ED eval for this and it helped pain but made her very sleep. She has run out and now uses Motrin. She states does  not help much. She wears  a 42 DDD bra. She has a hx of costochondritis and chronic low back painand states would be agreeable with Plastic surgery eval for possible reduction.  She now presents.  Hypertension History:      BP at Cardiology excellent. Not sure why so high this. States took pills right before she got here.        Positive major cardiovascular risk factors include hypertension.  Negative major cardiovascular risk factors include female age less than 27 years old, no history of diabetes, negative family history for ischemic heart disease, and non-tobacco-user status.        Positive history for target organ damage include cardiac end organ damage (either CHF or LVH).  Further assessment for target organ damage reveals no history of ASHD, stroke/TIA, or peripheral vascular disease.     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 One Daily 2)  Celexa 20 Mg Tabs (Citalopram Hydrobromide) .... One Daily  Allergies (verified): 1)  ! 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 Lovell Sheehan    Hysterectomy - June 2009 - no cancer cells    Epidurals low back - 2008 x 2 (02/12/2008)  Family History:    Father: Dead 83 Lung Cancer    Mother: Dead 08/30/22 cervical cancer    Siblings: 3 Sisters: DM, 3 Brothers  W&L    Kids: [id number]     (05/08/2006)  Social History:    Occupation: Psychologist, forensic    Married    Never Smoked    Alcohol use-no    Drug use-no     (05/08/2006)  Risk Factors:    Alcohol Use: N/A    >5 drinks/d w/in last 3 months: N/A    Caffeine Use: N/A    Diet: N/A    Exercise: N/A  Risk Factors:    Smoking Status: never (05/08/2006)    Packs/Day: N/A    Cigars/wk: N/A    Pipe Use/wk: N/A    Cans of tobacco/wk: N/A    Passive Smoke Exposure: N/A  Family History:    Father: Dead 14 Lung Cancer    Mother: Dead 27 cervical cancer    Siblings: 3 Sisters: 22s, 30s  - hx DM, 3 Brothers 49, unsure of others  W&L    Kids: [id number] - Boy - age 60 - healthy, girls age 84 and 45  - healthy  Social History:    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 Resp:  Denies cough, shortness of breath, sputum productive, and wheezing. GI:  Denies abdominal pain, constipation, diarrhea, nausea, and vomiting. GU:  Denies nocturia, urinary frequency, and urinary hesitancy. Psych:  Complains of anxiety, easily angered, and irritability; denies panic attacks,  thoughts of violence, and unusual visions or sounds; She states stll anxious. States hard to describe and jyst feels stressed. Cannot tell me what. States will get nervous and want to cry. She feels like failure as she does not work. She wants to work but fears her chest pain and other ailments will hinder. Hates not being able to help husband pay bills.  States tolerating Celexa well. States curious about higher dose.Marland Kitchen  Physical Exam  General:  Obese, in no acute distress. Alert and oriented X 3. Flat affect. Chest Wall:  Pain costochondral junctions Breasts:  Very large Lungs:  Normal respiratory effort, chest expands symmetrically. Lungs are clear to auscultation, no crackles or wheezes. Heart:  Normal rate and regular rhythm. S1 and S2 normal without gallop, murmur, click, rub or other extra sounds. Abdomen:  Bowel sounds positive,abdomen soft and non-tender without masses, organomegaly or hernias noted. Extremities:  No clubbing, cyanosis, edema, or deformity noted with normal full range of motion of all joints.   Cervical Nodes:  No lymphadenopathy noted Psych:  Cognition and judgment appear intact. Alert and cooperative with normal attention span and concentration. No apparent delusions, illusions, hallucinations   Impression & Recommendations:  Problem # 1:  CHEST PAIN (ICD-786.50) Discussed. She has had multiple ER visits with symptoms for 3 to 4 years now. She has been dx with costochondritis here but did not believe this as cause. She has had extensive work-up with CT angio via ED, she has now seen Cardiology who agrees musculoskeletal cause. I believe her signs stem from her large breasts and getting a reduction will aide in symptom reduction and minimization of unneccasry costly procedures. She is agreeable and we will ask for plastic surgery consult. I advised her some insurance do not cover breast reduction  but based on symptoms may be able to get her qualified. Again, await plastic  surgery input.  Problem # 2:  ESSENTIAL HYPERTENSION, BENIGN (ICD-401.1) BP high this morning. Log weekly for 4 weeks and titrate dose. Limit salt, exersise for 30 minutes daily and lose weight. Her updated medication list for this problem includes:    Amlodipine Besylate 5 Mg Tabs (Amlodipine besylate) ..... One daily  Problem # 3:  ANXIETY DEPRESSION (ICD-300.4) Increase celexa. Add low dose Klonopinbn until higher dose takes effect. Councelled stress coping skill and socialization. Consider psych eval. Urged to cont job search and reassured healthy enough to do. Advised financial and emotional benefits to having job and encouraged to pursue opportunities at [employer] where she hs applied for some positions.  Problem # 4:  OBESITY NOS (ICD-278.00) TLC diet with portion control and exersize urged.  Complete Medication List: 1)  Amlodipine Besylate 5 Mg Tabs (Amlodipine besylate) .... One daily 2)  Celexa 40 Mg Tabs (Citalopram hydrobromide) .... One daily 3)  Klonopin 0.5 Mg Tabs (Clonazepam) .... 1/2 to one pill two times a day  Other Orders: Misc. Referral (Misc. Ref)  Hypertension Assessment/Plan:      The patient's hypertensive risk group is category C: Target organ damage and/or diabetes.  Her calculated 10 year risk of coronary heart disease is 1 %.  Today's blood pressure is 140/96.  Her blood pressure goal is < 140/90.  Patient Instructions: 1)  Please schedule a follow-up appointment in 1 month. Prescriptions: KLONOPIN 0.5 MG TABS (CLONAZEPAM) 1/2 to one pill two times a day  #30 x 0   Entered and Authorized by:   Franchot Heidelberg MD   Signed by:   Franchot Heidelberg MD on 06/17/2008   Method used:   Print then Give to Patient   RxID:   [id number]   Appended Document: FOLLOW UP 2 WEEK/SLJ Plastic Surgery Referral faxed. pending appt date and time.    Appended Document: FOLLOW UP 2 WEEK/SLJ Pt scheduled with [facility] on 3 May at 2:00.  Office is located [address], [place], [place] [address].  [address][phone]  Appended Document: FOLLOW UP 2 WEEK/SLJ Patient advised of the above.

## 2010-04-10 NOTE — Progress Notes (Signed)
Summary: MRI referral  Phone Note Outgoing Call   Call placed by: Sonny Dandy,  May 12, 2007 9:00 AM Summary of Call: appointment set for MRI 05/15/07 at 930am, order faxed to [facility] and message left for patient to return call Needs lab work.................................................................Brenda Richardson  May 12, 2007 9:01 AM   referrral info given to patient .................................................................Brenda KitchenMarland KitchenSonny Dandy  May 12, 2007 9:11 AM  Initial call taken by: Sonny Dandy,  May 12, 2007 9:11 AM

## 2010-04-10 NOTE — Letter (Signed)
Summary: MEDICAL RECORDS RELEASE FORM  MEDICAL RECORDS RELEASE FORM   Imported By: Carlye Grippe 10/13/2008 13:44:04  _____________________________________________________________________  External Attachment:    Type:   Image     Comment:   External Document

## 2010-04-10 NOTE — Progress Notes (Signed)
Summary: Yeast  Phone Note Call from Patient   Caller: Patient Call For: al Summary of Call: patient is requesting one time rx for pill for yeast infection called in to [facility] in [place] [phone]  Follow-up for Phone Call        Adivse please Follow-up by: Sonny Dandy,  June 30, 2006 8:59 AM  Additional Follow-up for Phone Call Additional follow up Details #1::        Diflucan 200 mg one by mouth now (#1 WITH NO REFILLS). If sx persist, needs pelvic. Additional Follow-up by: Franchot Heidelberg MD,  June 30, 2006 9:41 AM   Additional Follow-up for Phone Call Additional follow up Details #2::    med called to [facility], [place]. patient notified. Follow-up by: Sonny Dandy,  June 30, 2006 10:17 AM

## 2010-04-10 NOTE — Assessment & Plan Note (Signed)
Summary: [name]    PCP:  Franchot Heidelberg, MD   History of Present Illness: I saw Brenda Richardson in the office today for a followup visit.  She is a 30 years old woman with the complaint of:  left leg pain and swelling  non comliant with meds  taking dose pack and relafen, neurontin  MRI: L5-S1 disc protrusion mild facet OA   has failed PT meds  rec ESI x 3 and 6 weeks f/u  encouraged to take meds   Prior Medications :  ULTRAM ER 100 MG TB24 (TRAMADOL HCL) once daily MOTRIN 800 MG TABS (IBUPROFEN) One three times a day for five days ACIPHEX 20 MG  TBEC (RABEPRAZOLE SODIUM) One daily REGLAN 10 MG  TABS (METOCLOPRAMIDE HCL) One every 6 hours as needed    Current Allergies (reviewed today): ! VICODIN  Past Medical History:    Reviewed history from 03/28/2006 and no changes required:       GERD   Family History:    Reviewed history from 05/08/2006 and no changes required:       Father: Dead 57 Lung Cancer       Mother: Dead 65 cervical cancer       Siblings: 3 Sisters: DM, 3 Brothers  W&L       Kids: [id number]  Social History:    Reviewed history from 05/08/2006 and no changes required:       Occupation: Psychologist, forensic       Married       Never Smoked       Alcohol use-no       Drug use-no

## 2010-04-10 NOTE — Letter (Signed)
Summary: NOTE FROM CLINICAL OUTPT DIETITIAN  NOTE FROM CLINICAL OUTPT DIETITIAN   Imported By: Rexene Alberts 12/26/2009 13:04:38  _____________________________________________________________________  External Attachment:    Type:   Image     Comment:   External Document

## 2010-04-10 NOTE — Progress Notes (Signed)
Summary: Wants CXR. Stll having headaches and parasthesia to right side.  Phone Note Call from Patient   Summary of Call: patient would like to know if you will order her a chest xray and [facility] [place], she isn't feeling well.  please advise Initial call taken by: Curtis Sites,  October 17, 2008 8:46 AM  Follow-up for Phone Call        c/o headache,chest hurting,numbness on right side,recently had pneumonia want chest x-ray to see if its cleared up.   Follow-up by: Carlye Grippe,  October 17, 2008 9:26 AM  Additional Follow-up for Phone Call Additional follow up Details #1::        I will not order CXR without appt. If she is having breathing difficulty will need to be seen.  If she is continuing with headache and parasthesia, would advise Neurology referal. Please make referal to Dr. Ninetta Lights and send copy of my note from last visit. Please ask them to do this asap. Additional Follow-up by: Franchot Heidelberg MD,  October 17, 2008 9:44 AM  New Problems: PARESTHESIA (ICD-782.0)   Additional Follow-up for Phone Call Additional follow up Details #2::    patient informed via voicemail. awaiting patient to call office back. Follow-up by: Carlye Grippe,  October 17, 2008 10:08 AM  Additional Follow-up for Phone Call Additional follow up Details #3:: Details for Additional Follow-up Action Taken: Neurology appt August 23rd @10 :15am with Dr. Ninetta Lights. MD out all this week and this is first available. Patient may also call their office next week to see if any cancellations. patient informed,notes faxed. Additional Follow-up by: Carlye Grippe,  October 17, 2008 3:06 PM  New Problems: PARESTHESIA (ICD-782.0)

## 2010-04-10 NOTE — Assessment & Plan Note (Signed)
Summary: Headache/numbness   Vital Signs:  Patient Profile:   30 Years Old Female Height:     66 inches (167.64 cm) O2 Sat:      100 % O2 treatment:    Room Air Pulse rate:   84 / minute Resp:     10 per minute BP sitting:   124 / 84  (left arm)  Vitals Entered By: Lutricia Horsfall (November 23, 2007 11:14 AM)                 PCP:  Franchot Heidelberg, MD  Chief Complaint:  headache since Thursday and numbness.  History of Present Illness: Here complaining of a severe headache since Thursday.  She says she has pain in the front and sharp pains intermittently on other parts of her head.  She says her entire tongue has been feeling numb intermittently since thursday also.    She says yesterday she had some numbness on the left side--started in her arm and went down to her leg but wasn't in her foot.  She went to the ER and had her blood pressure checked but didn't stay to be seen because she says it took too long.  She is having some nausea also.    She says she had trouble with headaches "a while back" but never had them like this.    Current Allergies: ! VICODIN  Past Medical History:    OTHER SPECIFIED DISORDERS OF LIVER (ICD-573.8)    VENTRICULAR HYPERTROPHY, LEFT (ICD-429.3)    CYSTITIS, ACUTE (ICD-595.0)    COSTOCHONDRITIS, RECURRENT (ICD-733.6)    DENTAL CARIES (ICD-521.00)    OBESITY NOS (ICD-278.00)    ESSENTIAL HYPERTENSION (ICD-401.9)    HEMORRHOIDS, INTERNAL (ICD-455.0)    MIGRAINE HEADACHE (ICD-346.90)    CONSTIPATION NOS (ICD-564.00)    GERD (ICD-530.81)          Physical Exam  General:     Obese, in no acute distress. Alert and oriented X 3.  Ears:     Tympanic membranes clear.  Ear canals without erythema.  Nose:     substantial Erythema and swelling with clear drainage.  Mouth:     No eyrthema or exudate.  Neck:     no carotid bruits.   Neurologic:     alert & oriented X3, cranial nerves II-XII intact, strength normal in all  extremities, sensation intact to light touch, gait normal, and DTRs symmetrical and normal.      Impression & Recommendations:  Problem # 1:  HEADACHE (ICD-784.0) With the numbness we will get a head CT but doubt CVA/TIA.  Suspect complicated migraine so will treat with IM toradol and metoclopramide.   Her updated medication list for this problem includes:    Ec-naprosyn 500 Mg Tbec (Naproxen) .Marland Kitchen..Marland Kitchen Two times a day  Orders: CT without Contrast (CT w/o contrast) Ketorolac-Toradol 15mg  (U0454) Metoclopramide hcl up to 10mg  (U9811) Admin of Therapeutic Inj  intramuscular or subcutaneous (91478)   Complete Medication List: 1)  Lyrica 50mg   .... One tablet tid 2)  Aciphex 20 Mg Tbec (Rabeprazole sodium) .... One daily 3)  Pyridium 200 Mg Tabs (Phenazopyridine hcl) .... Three times a day 4)  Ec-naprosyn 500 Mg Tbec (Naproxen) .... Two times a day 5)  Apnea Link Screen  .... Snoring and night time choking. 6)  Bactrim Ds 800-160 Mg Tabs (Sulfamethoxazole-trimethoprim) .... Two times a day 7)  Pyridium 200 Mg Tabs (Phenazopyridine hcl) .... Three times a day   Patient Instructions: 1)  The  patient will be called with the results when they are available.   ]  Medication Administration  Injection # 1:    Medication: Ketorolac-Toradol 15mg     Diagnosis: HEADACHE (ICD-784.0)    Route: IM    Site: LUOQ gluteus    Exp Date: 10/09/2008    Lot #: [id number]    Mfr: hospira    Comments: 60mg  IM per MD order    Patient tolerated injection without complications    Given by: Lutricia Horsfall (November 23, 2007 12:11 PM)  Injection # 2:    Medication: Metoclopramide hcl up to 10mg     Diagnosis: HEADACHE (ICD-784.0)    Route: IM    Site: RUOQ gluteus    Exp Date: 11/09/2008    Lot #: [id number]    Mfr: hospira    Patient tolerated injection without complications    Given by: Lutricia Horsfall (November 23, 2007 12:12 PM)  Orders Added: 1)  Est. Patient Level III [16109] 2)  CT  without Contrast [CT w/o contrast] 3)  Ketorolac-Toradol 15mg  [J1885] 4)  Metoclopramide hcl up to 10mg  [J2765] 5)  Admin of Therapeutic Inj  intramuscular or subcutaneous [60454]

## 2010-04-10 NOTE — Progress Notes (Signed)
Summary: sick visit  Phone Note Call from Patient   Caller: Spouse Summary of Call: Husband called and said patient is still sick"dizzy". Discussed with Dr Erby Pian and he will see her today at 215pm. Advised husband per Dr Erby Pian, if patient misses or cancels appointment, she will be discharged from practice. Initial call taken by: Sonny Dandy,  April 10, 2007 10:25 AM
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
## 2010-04-10 NOTE — Letter (Signed)
Summary: NOTES FROM [facility] INTERNAL MEDICINE  NOTES FROM [facility] INTERNAL MEDICINE   Imported By: Rexene Alberts 12/12/2009 14:33:08  _____________________________________________________________________  External Attachment:    Type:   Image     Comment:   External Document

## 2010-04-10 NOTE — Progress Notes (Signed)
  NAME:  CURSTIN, SCHMALE              ACCOUNT NO.:  [id number]  MEDICAL RECORD NO.:  [id number]          PATIENT TYPE:  INP  LOCATION:  A332                          FACILITY:  [facility]  PHYSICIAN:  Wilson Singer, M.D.DATE OF BIRTH:  [birth year]  DATE OF PROCEDURE: DATE OF DISCHARGE:                                PROGRESS NOTE   CURRENT WORKING DIAGNOSES: 1. Right mastitis. 2. Newly diagnosed diabetes with hemoglobin A1c recorded at 7.3%. 3. Bipolar disorder with anxiety. 4. Obesity.  HISTORY:  This is a 30 year old lady who was admitted complaining of right breast pain.  Please see initial history and physical examination that was done by myself.  HOSPITAL PROGRESS:  The patient was admitted and appropriately treated with intravenous antibiotics, Zosyn, and vancomycin.  This would be her third episode of mastitis that she has had.  The patient was slow to respond to the treatment and over the course of hospitalization, she was found to have an ESR of 61, and right breast pain which was not improving with the suggestion of possibly an abscess.  Therefore, she is due to have a mammogram by the suggestion of the Radiology Department to look for breast abscess.  Fortunately, today her right breast pain has actually improved.  PHYSICAL EXAMINATION:  VITAL SIGNS:  Temperature 98.6, blood pressure 123/83, pulse 82, saturation 93% on room air. HEART:  Heart sounds are present and normal. CHEST:  Lung fields are clear. BREAST:  Examination of the right breast shows softness with left tenderness and warmth than previously seen.  DISCUSSION:  This is a 30 year old lady who in now being switched to Augmentin 875 mg twice a day as antibiotic of choice for right breast mastitis, having been on intravenous vancomycin and intravenous Zosyn. She seemed to be progressing well now, but I think it would be appropriate to complete her investigation and do a mammogram which she is scheduled to  have tomorrow.  If the mammogram is within normal limits, she certainly would be able to be discharged home. However, if it does show an abscess, this will have to be treated appropriately with incision and drainage by surgery.     Wilson Singer, M.D.     NCG/MEDQ  D:  04/03/2010  T:  04/04/2010  Job:  [id number]  Electronically Signed by Lilly Cove M.D. on 04/10/2010 02:11:22 PM
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
## 2010-04-10 NOTE — Miscellaneous (Signed)
Summary: Physical Therapy Initial Note  Physical Therapy Initial Note   Imported By: Lutricia Horsfall 03/28/2008 13:42:50  _____________________________________________________________________  External Attachment:    Type:   Image     Comment:   External Document

## 2010-04-10 NOTE — Progress Notes (Signed)
Summary: LEG IS STILL BOTHERING HER  Phone Note Call from Patient   Caller: Patient Call For: AL Summary of Call: PATIENT OF DR. FERREIRA'S CALLED - LEG IS STILL BOTHERING HER AND WAS TOLD TO CALL BACK IF STILL HAVING PROBLEMS ON MONDAY  PHONE NUMBER [phone] CALL TAKEN HEATHER Initial call taken by: Alden Server,  August 25, 2006 10:09 AM  Follow-up for Phone Call        still c/o L leg/hip pain, now into lower back. stated the ibuprofen is not helping now. advise Follow-up by: Sonny Dandy,  August 25, 2006 10:28 AM  Additional Follow-up for Phone Call Additional follow up Details #1::        Missed follow-up appt on Friday. See if we can fit her in in the next few days. Additional Follow-up by: Franchot Heidelberg MD,  August 25, 2006 10:47 AM   Additional Follow-up for Phone Call Additional follow up Details #2::    call patient and see if we can get her in in the next few days Follow-up by: Sonny Dandy,  August 25, 2006 10:52 AM  Additional Follow-up for Phone Call Additional follow up Details #3:: Details for Additional Follow-up Action Taken: PATIENT SCHEDULED AN APPT FOR [facility] 08-28-06 AT 8:30AM Additional Follow-up by: Alden Server,  August 25, 2006 12:18 PM

## 2010-04-10 NOTE — Assessment & Plan Note (Signed)
Summary: DIVERTICULITIS/SWELLING IN ABD/SS   Visit Type:  Initial Visit Primary Care Provider:  gwen bowlin  Chief Complaint:  abd pain, abd swelling, and diverticulitis.  History of Present Illness: Patient is a pleasant morbidly obese 30 year old [place] lady here for evaluation of "diverticulitis".  This nice lady reports being hospitalized 3 times last fall once at [facility] and twice at [facility] side with "diverticulitis". She reports multiple  rounds of antibiotics with some difficulties establishing a definitive  diagnosis apparently.  Patient currently not on antibiotics. Patient states she does move move her bowels daily; denies constipation diarrheaComplains of abdominal "swelling". States she only eats one meal a day. I note she has gained 30 pounds and she was seen in 2008. She has seen multiple primary care physician over the past few years and is currently seeing Dr. Suzette Battiest at [facility]. She denies any rectal bleeding.  We saw this lady for abdominal  pain on multiple occasions in the last 10 years.  She status post diagnostic laparoscopy with incidental  appendectomy previously. She is benign lesions in the liver;  there've been followed long-term ; she had an  MRI of the liver previously. She felt a benign lesions. Colonoscopy by me back in 2007 demonstrated only anal canal hemorrhage. Abdominal CT previously demonstrated no evidence of diverticulosis nor did I see any diverticula on prior colonoscopy. I do not have any imaging studies done in [facility] for review at this time.  Current Problems (verified): 1)  Epigastric Pain  (ICD-789.06) 2)  Nausea and Vomiting  (ICD-787.01) 3)  Pelvic Pain  (ICD-789.09) 4)  Abdominal Pain  (ICD-789.00) 5)  Liver Lesions  () 6)  Foot Pain, Left  (ICD-729.5) 7)  Paresthesia  (ICD-782.0) 8)  ? of Tia  (ICD-435.9) 9)  Hyperglycemia  (ICD-790.29) 10)  Mastitis  (ICD-611.0) 11)  Pneumonia  (ICD-486) 12)  Bipolar Affective  Disorder  (ICD-296.80) 13)  Proteinuria  (ICD-791.0) 14)  Anxiety Depression  (ICD-300.4) 15)  Chest Pain  (ICD-786.50) 16)  Essential Hypertension, Benign  (ICD-401.1) 17)  Low Back Pain, Mild  (ICD-724.2) 18)  Ventricular Hypertrophy, Left  (ICD-429.3) 19)  Costochondritis, Recurrent  (ICD-733.6) 20)  Dental Caries  (ICD-521.00) 21)  Obesity Nos  (ICD-278.00) 22)  Hemorrhoids, Internal  (ICD-455.0) 23)  Migraine Headache  (ICD-346.90) 24)  Constipation Nos  (ICD-564.00) 25)  Gerd  (ICD-530.81)  Current Medications (verified): 1)  Valium 5 Mg Tabs (Diazepam) .... One To Two At Night As Needed For Anxiety Per Dr. Lolly Mustache 2)  Amitriptyline Hcl 50 Mg Tabs (Amitriptyline Hcl) .... As Needed  Allergies (verified): 1)  ! Vicodin 2)  ! Nubain (Nalbuphine Hcl) 3)  ! Toradol 4)  ! 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 Married Never Smoked Alcohol  use-no Drug use-no Lives with spouse and kids Education - 10th grade  Risk Factors: Alcohol Use: 0 (07/13/2008)  Risk Factors: Smoking Status: never (07/13/2008)  Vital Signs:  Patient profile:   30 year old female Height:      66 inches Weight:      268 pounds BMI:     43.41 Temp:     98.3 degrees F oral Pulse rate:   84 / minute BP sitting:   120 / 88  (left arm) Cuff size:   large  Vitals Entered By: Hendricks Limes LPN (May 31, 2009 9:07 AM)  Physical Exam  General:  very pleasant well-groomed lady resting comfortably in no acute distress Lungs:  clear to auscultation Heart:  regular rate rhythm without murmur gallop Abdomen:  nondistended obese positive bowel sounds and soft and nontender without mass or organomegaly  Impression & Recommendations:  Impression: This nice 30 year old morbidly obese lady relates multiple hospitalizations for abdominal pain last fall over in [place]. She's been treated with multiple rounds of antibiotics for "diverticulitis" apparently.  Unfortunately,  I do not have any  imaging studies for review at this time although she did have abdominal ultrasound done back in October of last year which demonstrated  2 cm lesion in the In the liver felt to be representing a hemangioma. This is likely the lesion we've been following over the years which has been quite stable.  At this time, the patient appears well. Her big complaint is abdominal swelling. She has gained a significant weight over the past 3 years.  Recommendations: Begin digestive advantage of gas formula one capsule daily. Samples provided. We'll  review the discharge summaries, al x-ray's etc. from [facility] and will re-evaluate in the  future. Further recommendations to follow.  Appended Document: Orders Update    Clinical Lists Changes  Problems: Added new problem of DIVERTICULITIS, COLON (ICD-562.11) Added new problem of ABDOMINAL PAIN, GENERALIZED  (ICD-789.07) Orders: Added new Service order of Consultation Level V [phone]) - Signed Added new Service order of Hemoccult Guaiac-1 spec.(in office) ([id number]) - Signed

## 2010-04-10 NOTE — Progress Notes (Signed)
Summary: foot swollen and hurts   Phone Note Call from Patient   Caller: Patient Call For: kim Summary of Call: would like to be worked in this week patient's foot is still swollen, hurts and unable to move phone number is [phone] Initial call taken by: Alden Server,  June 16, 2006 11:59 AM  Follow-up for Phone Call        pt coming in tommorrow at 1:45 Follow-up by: Sherilyn Banker,  June 16, 2006 4:09 PM

## 2010-04-10 NOTE — Miscellaneous (Signed)
Summary: Orders Update  Clinical Lists Changes  Orders: Added new Test order of T-Basic Metabolic Panel ([phone]) - Signed  

## 2010-04-10 NOTE — Letter (Signed)
Summary: Out of Work  [facility]  [address]   [place], [place] [address]   Phone: [phone]  Fax: [phone]    September 19, 2006   Employee:  Brenda Richardson    To Whom It May Concern:   For Medical reasons, please excuse the above named employee from work for the following dates:  Start:   09/19/06  End:   09/19/06  May return 09/20/06  If you need additional information, please feel free to contact our office.         Sincerely,    Franchot Heidelberg MD

## 2010-04-10 NOTE — Consult Note (Signed)
Summary: [facility]  [facility]   Imported By: Pablo Lawrence 10/16/2006 14:28:10  _____________________________________________________________________  External Attachment:    Type:   Image     Comment:   External Document

## 2010-04-10 NOTE — Letter (Signed)
Summary: LABS FROM [facility] INTERNAL MEDICINE  LABS FROM [facility] INTERNAL MEDICINE   Imported By: Rexene Alberts 12/14/2009 15:34:46  _____________________________________________________________________  External Attachment:    Type:   Image     Comment:   External Document  Appended Document: LABS FROM [facility] INTERNAL MEDICINE spoke with [facility] at [facility]. She has spoken with [facility] and they did not add on the ferritin, iron and tibc. Pt will need to have it redrawn. Mar[place] will call pt and inform her. They want to know if we can order labs and have pt done at [facility] so pt will not have to ride all the way to [place]. Please advise if its ok to order iron studies.   Appended Document: LABS FROM [facility] INTERNAL MEDICINE okay to order  Appended Document: LABS FROM [facility] INTERNAL MEDICINE Pt informed. Lab order faxed to [facility].

## 2010-04-10 NOTE — Progress Notes (Signed)
Summary: GI referral  Phone Note Outgoing Call   Call placed by: Sonny Dandy,  October 06, 2006 4:02 PM Summary of Call: called and spoke with patient, Results given to patient, voices understanding. records sent to [facility] and informed p[atient that they will call with date/time for referral Initial call taken by: Sonny Dandy,  October 06, 2006 4:03 PM
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
## 2010-04-10 NOTE — Letter (Signed)
Summary: Labs  Labs   Imported By: Micah Flesher, LPN 78/29/5621 30:86:57  _____________________________________________________________________  External Attachment:    Type:   Image     Comment:   External Document

## 2010-04-10 NOTE — Progress Notes (Signed)
Summary: ct results  Phone Note Call from Patient Call back at Home Phone [phone]   Caller: Patient Summary of Call: pt called wanting to know results from ct- informed her RMR has not signed off on ct yet and that I would call her as soon as he did. pt stated she was in alot of pain. She also wanted to let RMR know that she was admitted to [facility] last 3/25 and was discharged on 3/27 for abd pain. ( we are in the process of getting these records.) Initial call taken by: Hendricks Limes LPN,  June 07, 2009 8:47 AM     Appended Document: ct results see ct report

## 2010-04-10 NOTE — Letter (Signed)
Summary: Cyndie Mull show  Letter-no show   Imported By: Pablo Lawrence 09/01/2006 16:09:24  _____________________________________________________________________  External Attachment:    Type:   Image     Comment:   External Document

## 2010-04-10 NOTE — Letter (Signed)
Summary: MRI PELVIS DONE AT [facility]  MRI PELVIS DONE AT [facility]   Imported By: Ave Filter 06/07/2009 13:11:16  _____________________________________________________________________  External Attachment:    Type:   Image     Comment:   External Document  Appended Document: MRI PELVIS DONE AT [facility] MRI of abdomen and pelvis report reviewed. Again, free fluid in the pelvis. Both ovaries identified. Diverticulosis but no evidence of diverticulitis or other inflammatory/acute GI process. Patient needs to see the gynecologist.

## 2010-04-10 NOTE — Progress Notes (Signed)
Summary: 04/19/07 overnight oxiemtry  Phone Note Outgoing Call   Call placed by: Sonny Dandy,  April 20, 2007 3:45 PM Summary of Call: Results given to patient, voices understanding    Initial call taken by: Sonny Dandy,  April 20, 2007 3:45 PM

## 2010-04-10 NOTE — Assessment & Plan Note (Signed)
Summary: PHYSICAL/ARC   Vital Signs:  Patient Profile:   30 Years Old Female Height:     66 inches (167.64 cm) Weight:      241 pounds BMI:     39.04 O2 Sat:      100 % Temp:     97.3 degrees F Pulse rate:   83 / minute Resp:     14 per minute BP sitting:   134 / 97  Vitals Entered By: Sherilyn Banker (December 15, 2006 8:36 AM)                 PCP:  Franchot Heidelberg, MD  Chief Complaint:  sore throat x 3 days.  History of Present Illness: Pt in for physical.  She notes her main concern today is sore throat. She began having this Saturday. States used Cepachol and not really hleping. Also using Nyquil wqth minimal relief. States throat hurts. No significant PND. Denies stuffy nose. Notes left ear pain. Denies fever, chills and sweats but was hot yesterday. Notes throat feels swollen. She denies ill contacts and travel.  SHe has not coughed to much. Apetite is down. No nausea or vomitting. No diarrhea or constipation. She denies urinary sx including burning, stinging and bad smells.  She has not had any brest problem inlcuding breast pain, nipple retraction and discharge. She is G4P3M1. She was 10 with her first period and last was in late Sept. She tried to breast feed  but could not do this. She has nbo fam hx of breast cancer and she denies any hx of abnormal breast exams. She denies vaginal discharge and has no abnormal bleeding. Cycle is regular and she bleeds for 5 days. Had single spell last year when she had cycle twice. She thinks her mom had some type of female cancer but is not sure. She has no hx of abnormal pap-smears. No hx of STDs. She is sexually active. She has a hx of tubal ligation. She does not smoke.  Now presents.  Hypertension History:      She denies headache, chest pain, palpitations, dyspnea with exertion, orthopnea, PND, peripheral edema, visual symptoms, neurologic problems, syncope, and side effects from treatment.  Further comments include: Watching salt.  States has been really good. TLC at present with no med use.        Positive major cardiovascular risk factors include hypertension.  Negative major cardiovascular risk factors include female age less than 57 years old and non-tobacco-user status.    Lipid Management History:      Positive NCEP/ATP III risk factors include hypertension.  Negative NCEP/ATP III risk factors include female age less than 20 years old and non-tobacco-user status.      Current Allergies (reviewed today): ! 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 Lovell Sheehan   Family History:    Reviewed history from 05/08/2006 and no changes required:       Father: Dead 23 Lung Cancer       Mother: Dead August 10, 2022 cervical cancer       Siblings: 3 Sisters: DM,  3 Brothers  W&L       Kids: [id number]  Social History:    Reviewed history from 05/08/2006 and no changes required:       Occupation: Psychologist, forensic       Married       Never Smoked       Alcohol use-no       Drug use-no    Review of Systems  General      Complains of fatigue and malaise.  Eyes      See HPI  ENT      See HPI  CV      Denies chest pain or discomfort, fainting, swelling of feet, swelling of hands, and weight gain.  Resp      Denies chest discomfort, shortness of breath, sputum productive, and wheezing.  GI      Denies abdominal pain, diarrhea, gas, nausea, and vomiting.      Hx of EGD under Dr. Jena Gauss. STarted Nexium as told had GERD and Hital Hernia.  Sees him Jan 13, 2007. Off Aciphex and on Nexium. Neither helped. Stopped both. Sees him in early Nov and would like to hold off on rx until seen by him.  GU      See HPI      Denies abnormal vaginal  bleeding, urinary frequency, and urinary hesitancy.  MS      See HPI      Complains of low back pain.      L5-S1 DDD. Dr. Starling Manns optomizing. Was on Neurontin and Naproxen. No help. Sent for steroid injections. Done at [facility]. Made her itch. Off all meds. States has appt today to see Dr. Romeo Apple to see what next step will be. States shots did help some - some aching left hip and back.  Derm      Denies changes in nail beds, lesion(s), poor wound healing, and rash.  Neuro      Denies brief paralysis, headaches, poor balance, visual disturbances, and weakness.  Psych      Denies anxiety, depression, and easily angered.  Endo      Denies cold intolerance, excessive hunger, excessive thirst, excessive urination, heat intolerance, polyuria, and weight change.  Heme      Denies abnormal bruising, bleeding, enlarge lymph nodes, fevers, pallor, and skin discoloration.   Physical Exam  General:     Well-developed,well-nourished,in no acute distress; alert,appropriate and cooperative throughout examination. Obese. Tired appearing with dry cough. Head:     Normocephalic and atraumatic without obvious abnormalities. No apparent alopecia or balding. Eyes:     PERRLA. Normal EOM. Ears:     Clear TMs. Nose:     Mod turbinate inflammation with clear liquid both nares. Mouth:     MIld phrayngeal erythema. Clear liquid in pharynx. Neck:     No deformities, masses, or tenderness noted. Breasts:     No mass, nodules, thickening, tenderness, bulging, retraction, inflamation, nipple discharge or skin changes noted.   Lungs:     Normal respiratory effort, chest expands symmetrically. Lungs are clear to auscultation, no crackles or wheezes. Heart:     Normal rate and regular rhythm. S1 and S2 normal without gallop, murmur, click, rub or other extra sounds. Abdomen:     Bowel sounds positive,abdomen soft and non-tender without masses, organomegaly or hernias noted. Genitalia:      Normal introitus for age, no external lesions, note thin white vaginal discharge, mucosa pink and moist, no vaginal lesion but cervical lesions in  form of Nabothian cysts noted with mild cervical erythroplakia, no vaginal atrophy, no friaility or hemorrhage, normal uterus size and position, no adnexal masses or tenderness Extremities:     No clubbing, cyanosis, edema, or deformity noted with normal full range of motion of all joints.   Neurologic:     No cranial nerve deficits noted. Station and gait are normal. Plantar reflexes are down-going bilaterally. DTRs are symmetrical throughout. Sensory, motor and coordinative functions appear intact. Skin:     Tattoo left arm. Heart right deltoid with name Gerlean Ren. Scooby doo right forearm. Cervical Nodes:     No lymphadenopathy noted Axillary Nodes:     No palpable lymphadenopathy Psych:     Cognition and judgment appear intact. Alert and cooperative with normal attention span and concentration. No apparent delusions, illusions, hallucinations    Impression & Recommendations:  Problem # 1:  WELL ADULT EXAM (ICD-V70.0) Exam completed. Await pap-result, wet prep and vaginal culture. Advised diet, exersize and weight loss. Councelled yearly eye exam and dental care. Stressed need for vehicle safety, calcium and Vit D supplement.Agrees. Recheck one year if all normal. Mammo at 38. Orders: T-TSH ([id number])   Problem # 2:  PHARYNGITIS, ACUTE (ICD-462) Strep swab done and found to be neg. Likley viral. Advised fluid push, daily MVI, chloraseptic spray and use of Tylenol Cold. Will excuse from work for one day and letter given. The following medications were removed from the medication list:    Motrin 800 Mg Tabs (Ibuprofen) ..... One three times a day for five days  Orders: T-CBC w/Diff ([id number]) Rapid Strep ([id number])   Problem # 3:  OBESITY NOS (ICD-278.00) See above. Weight loss  amust with diet, exersize and portion control.  Agrees. Orders: T-Comprehensive Metabolic Panel ([id number])   Problem # 4:  GERD (ICD-530.81) Not open to meds. TLC a must. Advised need for follow-up with Dr. Jena Gauss. Agreeable. The following medications were removed from the medication list:    Aciphex 20 Mg Tbec (Rabeprazole sodium) ..... One daily   Problem # 5:  HIP PAIN, LEFT (ICD-719.45) Per Dr. Romeo Apple. The following medications were removed from the medication list:    Ultram Er 100 Mg Tb24 (Tramadol hcl) ..... Once daily    Motrin 800 Mg Tabs (Ibuprofen) ..... One three times a day for five days   Problem # 6:  ESSENTIAL HYPERTENSION (ICD-401.9) BP improving.  Advised diet, exerszie and low salt intake. Recheck 3 months - if remian high, start HCTZ.  Orders: T-Comprehensive Metabolic Panel [id number]) T-Lipid Profile [id number])   Hypertension Assessment/Plan:      The patient's hypertensive risk group is category A: No risk factors and no target organ damage.  Today's blood pressure is 134/97.  Her blood pressure goal is < 140/90.  Lipid Assessment/Plan:      Based on NCEP/ATP III, the patient's risk factor category is "0-1 risk factors".  From this information, the patient's calculated lipid goals are as follows: Total cholesterol goal is 200; LDL cholesterol goal is 160; HDL cholesterol goal is 40; Triglyceride goal is 150.     Patient Instructions: 1)  Please schedule a follow-up appointment in 3 months.    ] Laboratory Results    Other Tests  Rapid Strep: negative  Kit Test Internal QC: Positive   (Normal Range: Negative)    Appended Document: Orders Update    Clinical Lists Changes  Problems: Added new problem of VAGINAL DISCHARGE (ICD-623.5) Orders: Added new Test order of T- * Misc. Laboratory  test [id number]) - Signed

## 2010-04-10 NOTE — Progress Notes (Signed)
Summary: phone call  Phone Note Call from Brenda Richardson   Caller: Brenda Richardson Summary of Call: Brenda Richardson called in this AM wanting an appointment, I told her that Dr. Erby Pian was booked and the earliest I could get her in was on Thurs at 1:15, she said she refussed to go to the hospital when she had a doctor.  I told her I would go and ask Dr. Erby Pian, they said she could come in on Thurs. she then stated she would find another Dr. one that could take care of her.  she then again said you mean to tell me with all this pain I am having in my stomach and my back, he can't see me.  I then again said we could see her on Thursday, she hung up on me. Initial call taken by: Curtis Sites,  May 06, 2007 8:40 AM

## 2010-04-10 NOTE — Letter (Signed)
Summary: RECORDS FROM [facility] FROM [facility]   Imported By: Diana Eves 06/07/2009 11:29:21  _____________________________________________________________________  External Attachment:    Type:   Image     Comment:   External Document

## 2010-04-10 NOTE — Letter (Signed)
Summary: appt. with Behavioral health  appt. with Behavioral health   Imported By: Curtis Sites 06/30/2008 10:51:49  _____________________________________________________________________  External Attachment:    Type:   Image     Comment:   External Document

## 2010-04-10 NOTE — Letter (Signed)
Summary: [facility] report  [facility] report   Imported By: Donneta Romberg 04/23/2007 16:39:22  _____________________________________________________________________  External Attachment:    Type:   Image     Comment:   External Document

## 2010-04-10 NOTE — Letter (Signed)
Summary: [facility] INP. NOTES  [facility] INP. NOTES   Imported By: Ave Filter 06/22/2009 12:12:34  _____________________________________________________________________  External Attachment:    Type:   Image     Comment:   External Document

## 2010-04-10 NOTE — Miscellaneous (Signed)
Summary: PT Discharge Summary  PT Discharge Summary   Imported By: Lutricia Horsfall LPN 16/12/9602 54:09:81  _____________________________________________________________________  External Attachment:    Type:   Image     Comment:   External Document

## 2010-04-10 NOTE — Progress Notes (Signed)
Summary: Low BP and depression  Phone Note Call from Patient   Summary of Call: Pt states her BP has been about 98/70 and she says that she has had some dizziness. She sataes that she has not had an appetite but she has been pushing fluids. Pt very concerned about depression. Pt will not come in any sooner than May 5th. Initial call taken by: Sherilyn Banker LPN,  July 06, 2008 8:56 AM  Follow-up for Phone Call        Noted. Needs appt. Advise need to bring cuff in for verification of accuracy on next appt.  Follow-up by: Franchot Heidelberg MD,  July 06, 2008 9:23 AM  Additional Follow-up for Phone Call Additional follow up Details #1::        Pt notified and voices understanding. Additional Follow-up by: Sherilyn Banker LPN,  July 06, 2008 10:04 AM

## 2010-04-10 NOTE — Progress Notes (Signed)
Summary: MRI abd referral  Phone Note Outgoing Call   Call placed by: Sonny Dandy,  October 02, 2006 11:42 AM Summary of Call: patient called and advised of MRI with & without 10/06/06 at 730am. patient told npo 4hr prior to exam, voices understanding   Initial call taken by: Sonny Dandy,  October 02, 2006 11:43 AM

## 2010-04-10 NOTE — Progress Notes (Signed)
Summary: SOB  Phone Note Call from Patient   Summary of Call: Pt called wanting to speak to Dr Erby Pian, she said that she was having severe SOB. I told her to go to the ER now or call 911. Initial call taken by: Sherilyn Banker LPN,  October 13, 2008 3:27 PM  Follow-up for Phone Call        Agree. Follow-up by: Franchot Heidelberg MD,  October 13, 2008 4:03 PM

## 2010-04-10 NOTE — Letter (Signed)
Summary: Brenda Richardson  Letter-no show   Imported By: Pablo Lawrence 09/01/2006 16:08:36  _____________________________________________________________________  External Attachment:    Type:   Image     Comment:   External Document

## 2010-04-10 NOTE — Letter (Signed)
Summary: [facility]  [facility]   Imported By: Magdalene River 04/29/2006 15:20:01  _____________________________________________________________________  External Attachment:    Type:   Image     Comment:   External Document

## 2010-04-10 NOTE — Letter (Signed)
Summary: Disability  Disability   Imported By: Curtis Sites 08/17/2008 15:41:48  _____________________________________________________________________  External Attachment:    Type:   Image     Comment:   External Document

## 2010-04-10 NOTE — Assessment & Plan Note (Signed)
Summary: HFU/SLJ   Vital Signs:  Patient profile:   30 year old female Height:      66 inches Weight:      258 pounds BMI:     41.79 O2 Sat:      99 % Temp:     98.7 degrees F Pulse rate:   89 / minute Resp:     14 per minute BP sitting:   138 / 78  Vitals Entered By: Sherilyn Banker LPN (October 11, 2008 9:16 AM) CC: Hospital follow-up, Hypertension Management   Primary Provider:  Franchot Heidelberg, MD  CC:  Hospital follow-up and Hypertension Management.  History of Present Illness: Pt in for hospital follow-up.  She states on Thursday last weke developed some dizzyness, numbness in tongue on right and had same in right arm and right leg.  She states she had symptoms most of Thursday and then on Friday wokeup. As symptoms persisted, she decided to go to ED at [facility].   She states they did extensive eval and she was advised need for admission. She states was dx with penumonia and advised would not explain headache and right sided weakness. She was also told had breast infection and sinus infection. States was not sure what caused the numbness or tingling.   She was told to stop Amitryptilline. Se was placed on this per Plastic surgery for her parasthesia in her breast post reduction.  She has been on this for about 2 two 3 weeks. She was not treated the first date and states on Saturday was given an iv, abx and morphine for breast pain. Also given shots for blood clot.   She was dicharged on Omnicef, Percocet and Phenergan. She was told to get a 3 hr glucose tolerance test and was advised to start Neurontin for her parasthesia inbstead of Amitiptylline.  I called [facility] and got records:   HPI reviewed:  1. Bilateral community acquired penumonia 2. Mild right sided mastitis 3. Right sided hemi-corporal parasthesia ED vitals on admit -   BP 170/97 CMP - Potassium 3.4  Glucose 181 WBC 16.4 with left shift HGB 10.7  Urine drug screen for benzo - on Valium per  psychiatry TSH - normal at 0.76 Normal B12 level A1C 5.7  At time of DC WBC was trending down as was her BS.  MRA head and neck - normal carotid and basilar arteries MRI Brain  - normal with no significant sinus disease Carotid dopplers - normal CT head - ormal CXR - low lung volumes and bibasilar atelectasis EKG - normal  She denies fever, notes some cold chills occasionally. She notes no sweats.  She denies facial pain, runny nose and sore throat. No ear pain.  She denies cough and wheeze. She has felt a little SOB. She states she has some mild chest pain over breast area on right. She denies drainage and discharge. States was told hot and swollen at [facility]. She states her legs are severely swollen. She denies orthopnea, PND and palpitations.  She has a decreased apetite. She denies vomitting and notes some nausea when taking Rx. She denis abd pain, notes rare lose stools and notes no constipation or bloody stools.   She states had cortsione shot left knee week befo9re last after hurting knee on trampoline. Dr. Marjory Sneddon of Ortho saw her at ED and gave her shot. Has helped.  She denies dysuria, frequency and nocturia.  She states her head is feeling fair. SHe has mild frontal headcahe  and states some discomfort right at forehead. Throb. Worse wiht activity. Better with rest. Rates headache as 5/10. She reports tremor occasionally both arms. She reports weakness on right. She reports fall on Friday of admission - bathroom floor. States no syncopy. Unsure why she fells - legs just got weak.  She denies hx of sizure.   She has a hx of bipolar disorder. Set to see Dr. Lolly Mustache this morning. She denies anxiety and depression. States no suicidal ideations. She ihas poor concentration and energy is low. She denies irritability. She states main stressor is money. She ran out of her meds 2 weeks ago. SHe states started back on Rx last Thursday before symptoms began.   She now  presents.  Hypertension History:      Positive major cardiovascular risk factors include hypertension.  Negative major cardiovascular risk factors include female age less than 30 years old, no history of diabetes, negative family history for ischemic heart disease, and non-tobacco-user status.        Positive history for target organ damage include cardiac end organ damage (either CHF or LVH).  Further assessment for target organ damage reveals no history of ASHD, stroke/TIA, or peripheral vascular disease.    Current Problems (verified): 1)  Bipolar Affective Disorder  (ICD-296.80) 2)  Proteinuria  (ICD-791.0) 3)  Anxiety Depression  (ICD-300.4) 4)  Chest Pain  (ICD-786.50) 5)  Essential Hypertension, Benign  (ICD-401.1) 6)  Low Back Pain, Mild  (ICD-724.2) 7)  Ventricular Hypertrophy, Left  (ICD-429.3) 8)  Costochondritis, Recurrent  (ICD-733.6) 9)  Dental Caries  (ICD-521.00) 10)  Obesity Nos  (ICD-278.00) 11)  Hemorrhoids, Internal  (ICD-455.0) 12)  Migraine Headache  (ICD-346.90) 13)  Constipation Nos  (ICD-564.00) 14)  Gerd  (ICD-530.81)  Current Medications (verified): 1)  Amlodipine Besylate 5 Mg Tabs (Amlodipine Besylate) .... One Daily 2)  Abilify 10 Mg Tabs (Aripiprazole) .... One Daily Per Dr. Lolly Mustache 3)  Omnicef 300 Mg .... One Two Times A Day 4)  Percocet 5-325 Mg Tabs (Oxycodone-Acetaminophen) .... One Every 6 Hours As Needed 5)  Promethazine Hcl 25 Mg Tabs (Promethazine Hcl) .... One Every 6 Hours With Nausea Asscoiated With Percocet 6)  Valium 5 Mg Tabs (Diazepam) .... One To Two At Night As Needed For Anxiety Per Dr. Lolly Mustache  Allergies (verified): 1)  ! Vicodin 2)  ! Nubain (Nalbuphine Hcl) 3)  ! 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 Alert and oriented X 3. Flat affect.  Head:  Normocephalic and atraumatic without obvious abnormalities. No apparent alopecia or balding. Eyes:  No corneal or conjunctival inflammation noted. EOMI. Perrla. Ears:  External ear exam shows no significant lesions or deformities.  Otoscopic examination reveals clear canals, tympanic membranes are intact bilaterally without bulging, retraction, inflammation or discharge. Hearing is grossly normal bilaterally. Nose:  External nasal examination shows no deformity or inflammation. Nasal mucosa are pink and moist without lesions or exudates. Mouth:  Oral mucosa and oropharynx without lesions or exudates.   Neck:  No deformities, masses, or tenderness noted. Breasts:  No mass, nodules, thickening, tenderness, bulging, retraction, inflamation, nipple discharge or skin  changes noted.   Srgial scars from breast resection well healed. Note tattoo right breast - bulldog, left breast has cherries.  Lungs:  Normal respiratory effort, chest expands symmetrically. Lungs are clear to auscultation, no crackles or wheezes. Exam somehwat difficult given obesity. Heart:  RRR. No murmur. Abdomen:  Bowel sounds positive,abdomen soft and non-tender without masses, organomegaly or hernias noted. Extremities:  No redness, swelling or tenderness despite her report of "severely swollen legs" Neurologic:  No cranial nerve deficits noted. Station and gait are normal. Plantar reflexes are down-going bilaterally. DTRs are symmetrical throughout. Sensory, motor and coordinative functions appear intact. Skin:  Intact without suspicious lesions or rashes Cervical Nodes:  No lymphadenopathy noted Psych:  Flat affect. Alert nd oriented. Good eye contact.   Impression & Recommendations:  Problem # 1:  PNEUMONIA (ICD-486) Discussed. Given elevated WBC and bibasilar findings on xray agree this certainly in diff. However, she does report steroid shot week prior to admit and this certainly could have caused WBC elevation in ddition to mastitis. Advised to complete Omnicef as started, push fluids, get adequate rest and do ten deep breasth every few hours to resolve small air space collapse. Agree. Update if fever, cough etc.   Problem # 2:  MASTITIS (ICD-611.0) Neg exam here. Complete abx as directed.  Problem # 3:  HYPERGLYCEMIA (ICD-790.29) Discussed. A1C 5.7. Advisedwith acute stress raction including pneumonia, mastitis and recent steroid shot, how BS can be elevated. I advised agaisnt the 3 hour glucose tolerance test in lieu of the normal A1C and she was agreeable. Advisedhon high risk psych meds to follow BS periodically, to work on diet, exersize and weight loss.   Problem # 4:  ? of TIA (ICD-435.9) Extensive normal eval at [facility]. She did not have Echo and I am going to get this.  If normal and symptoms persist, refer Neurology to optomize. Dicussed diff including MS, TIA,meedication side-effect and psychosomatic causes given her bipolar and fact that she ran out of Rx due to cost concerns. Update if recurs and advised to hold Neurontin and Amitryptilline for now. Orders: Echo Referral (Echo)  Problem # 5:  BIPOLAR AFFECTIVE DISORDER (ICD-296.80) See Dr. Lolly Mustache of psych this am to optomize. Advised to ceck with him on patient assistance for psychiatric medications. Do not run out.  Problem # 6:  ESSENTIAL HYPERTENSION, BENIGN (ICD-401.1) Stable. Rx as below.  Her updated medication list for this problem includes:    Amlodipine Besylate 5 Mg Tabs (Amlodipine besylate) ..... One daily  Orders: Echo Referral (Echo)  Complete Medication List: 1)  Amlodipine Besylate 5 Mg Tabs (Amlodipine besylate) .... One daily 2)  Abilify 10 Mg Tabs (  Aripiprazole) .... One daily per dr. Lolly Mustache 3)  Omnicef 300 Mg  .... One two times a day 4)  Percocet 5-325 Mg Tabs (Oxycodone-acetaminophen) .... One every 6 hours as needed 5)  Promethazine Hcl 25 Mg Tabs (Promethazine hcl) .... One every 6 hours with nausea asscoiated with percocet 6)  Valium 5 Mg Tabs (Diazepam) .... One to two at night as needed for anxiety per dr. Lolly Mustache  Hypertension Assessment/Plan:      The patient's hypertensive risk group is category C: Target organ damage and/or diabetes.  Her calculated 10 year risk of coronary heart disease is 1 %.  Today's blood pressure is 138/78.  Her blood pressure goal is < 140/90.   Patient Instructions: 1)  Please schedule a follow-up appointment in 2 weeks. Sooner if needed.  Appended Document: HFU/SLJ 2-D echo appt scheduled on 10/13/08 @8 :30am at [facility]. patient informed and order faxed. no precert needed.

## 2010-04-10 NOTE — Letter (Signed)
Summary: Recall, Labs Needed  [facility]  [address]   [place], [place] [address]   Phone: [phone]  Fax: [phone]    January 26, 2010  Brenda Richardson [address] [place], [place]  [address] 10-20-1980   Dear Ms. Frisby,   Our records indicate it is time to repeat your blood work.  You can take the enclosed form to the lab on or near the date indicated.  Please make note of the new location of the lab:   [address], 2nd floor   [facility]  Our office will call you within a week to ten business days with the results.  If you do not hear from [place] in 10 business days, you should call the office.  If you have any questions regarding this, call the office at [phone], and ask for the nurse.  Labs are due on 02/19/2010.   Sincerely,    Hendricks Limes LPN  [facility] Ph: [phone]   Fax: [phone]

## 2010-04-10 NOTE — Progress Notes (Signed)
Summary: Lower Abdominal Pain  Phone Note Call from Patient   Caller: Patient Call For: kim Summary of Call: Pt requesting to talk to dr Erby Pian regarding a referral to gyn in [place].Marland KitchenREGINA Odonnel just left this appt.Marland KitchenPROVIDER:  said that everything looks normal..Marland KitchenREGINA Melching also want to know if she needs to contact dr Jena Gauss to schedule appendectomy pt phone number [phone] Initial call taken by: Magdalene River,  April 29, 2006 10:35 AM  Follow-up for Phone Call        notes from GYN attached to pts chart. Follow-up by: Sherilyn Banker,  April 29, 2006 11:05 AM  Additional Follow-up for Phone Call Additional follow up Details #1::        Advise referal to Dr. Jena Gauss - has seen for this in past. Lower abdominal pain with normal OBGYN work-up.Advised ptlate last year she might need laproscopic exploration with appendectomy  Does not need to see other OBGYN at this time. Nurse to refer. Additional Follow-up by: Franchot Heidelberg MD,  April 29, 2006 12:15 PM  New Problems: ABDOMINAL PAIN, LOWER (ICD-789.09)  New Problems: ABDOMINAL PAIN, LOWER (ICD-789.09)

## 2010-04-10 NOTE — Miscellaneous (Signed)
Summary: Orders Update  Clinical Lists Changes  Orders: Added new Test order of T-Folate [phone]) - Signed Added new Test order of T-B12, Serum Total Only (84132) - Signed Added new Test order of T-Ferritin [phone]) - Signed Added new Test order of T-Iron Binding Capacity (TIBC) ([id number]) - Signed Added new Test order of T-Iron ([id number]) - Signed

## 2010-04-10 NOTE — Progress Notes (Signed)
Summary: pt needs letter  Phone Note Call from Patient Call back at Home Phone [phone]   Caller: Patient Summary of Call: pt called- she is requesting a letter written and sent to her that states she needs to have the bloodwork and tcs/egd done and why so she can send it to her lawyer.  Initial call taken by: Hendricks Limes LPN,  December 21, 2009 4:07 PM     Appended Document: pt needs letter The lawyer will have to send a release of information form signed by the patient stating SPECIFICALLY where she would like for [place] to release her records to.  She may come by the office or LAW may do this by mail, but verbal ROI consents are not protected by HIPPA when sent to an outside entity.  Let me know if the patients has any issues with completing this with her lawyer's office.  Appended Document: pt needs letter The process was explained to the patient. I told her to contact her lawyers office and have them to fax/mail [place] a release of medical records and we will be glad to release those to them via fax/mail

## 2010-04-10 NOTE — Progress Notes (Signed)
Summary: medication  Phone Note Call from Patient   Caller: Patient Summary of Call: Patient states she needs some nerve medicine she is about to have a nervous breakdown.  she said that her Niece's baby stopped breathing Monday night, and they just called and told her that the baby was brain dead.  I can hear her sniffling as we talk.  [phone]. Initial call taken by: Curtis Sites,  June 25, 2007 3:34 PM  Follow-up for Phone Call        Appt in am. Follow-up by: Franchot Heidelberg MD,  June 25, 2007 4:03 PM  Additional Follow-up for Phone Call Additional follow up Details #1::        Ptdid nt want to make appt yet shestates thatshe will call tommorrowmorningfirstthing and see whatwe have. Additional Follow-up by: Sherilyn Banker,  June 25, 2007 4:32 PM

## 2010-04-10 NOTE — Progress Notes (Signed)
Summary: cancel appoint  Phone Note Outgoing Call   Call placed by: Sonny Dandy,  September 30, 2006 1:38 PM Summary of Call: patient called and said to cancel her appoint at 300pm, she is feeling fine. Had US done 09/29/06 at [facility] Initial call taken by: Sonny Dandy,  September 30, 2006 1:39 PM  Follow-up for Phone Call        Noted. Follow-up by: Franchot Heidelberg MD,  September 30, 2006 1:51 PM
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
## 2010-04-10 NOTE — Assessment & Plan Note (Signed)
Summary: UTI/ARC   Vital Signs:  Patient Profile:   30 Years Old Female Height:     66 inches (167.64 cm) Weight:      247 pounds BMI:     40.01 O2 Sat:      99 % Temp:     97.6 degrees F Pulse rate:   79 / minute Resp:     16 per minute BP sitting:   140 / 88  Vitals Entered By: Sherilyn Banker (April 28, 2007 10:57 AM)                 PCP:  Franchot Heidelberg, MD  Chief Complaint:  urinary frequency x 1 week.  History of Present Illness: Pt in for acute visit.  She was recently seen for urinary frequency.  She states this has worsened. She states she has burning, stinging and is going 3 to 4 times a night. Se noites no fever but is tired and worn oiut. Mild suprapubic fullness and little bit of low back pain. Has used pyridium and it helped. Sexually active - does not void after coitus.   She now presents.    Updated Prior Medication List: * LYRICA 50MG  one tablet tid ACIPHEX 20 MG  TBEC (RABEPRAZOLE SODIUM) One daily PYRIDIUM 200 MG  TABS (PHENAZOPYRIDINE HCL) three times a day EC-NAPROSYN 500 MG  TBEC (NAPROXEN) two times a day * APNEA LINK SCREEN Snoring and night time choking. BACTRIM DS 800-160 MG  TABS (SULFAMETHOXAZOLE-TRIMETHOPRIM) two times a day PYRIDIUM 200 MG  TABS (PHENAZOPYRIDINE HCL) three times a day  Current Allergies (reviewed today): ! 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 Lovell Sheehan   Family History:    Reviewed history from 05/08/2006 and no changes required:       Father: Dead 21 Lung Cancer       Mother: Dead 2022/08/23 cervical cancer       Siblings: 3 Sisters: DM, 3 Brothers  W&L       Kids: [id number]  Social History:    Reviewed history from  05/08/2006 and no changes required:       Occupation: Psychologist, forensic       Married       Never Smoked       Alcohol use-no       Drug use-no   Risk Factors: Tobacco use:  never Drug use:  no Alcohol use:  no  Colonoscopy History:    Date of Last Colonoscopy:  10/21/2005  PAP Smear History:    Date of Last PAP Smear:  10/21/2005   Review of Systems      See HPI   Physical Exam  General:     Well-developed,well-nourished,in no acute distress; alert,appropriate and cooperative throughout examination. Obese.  Lungs:     Normal respiratory effort, chest expands symmetrically. Lungs are clear to auscultation, no crackles or wheezes. Heart:     Normal rate and regular rhythm. S1 and S2 normal without gallop, murmur, click, rub or other extra sounds. Abdomen:     Bowel sounds positive,abdomen soft and non-tender  without masses, organomegaly or hernias noted. No CVA tenderness. Extremities:     No clubbing, cyanosis, edema, or deformity noted with normal full range of motion of all joints.      Impression & Recommendations:  Problem # 1:  CYSTITIS, ACUTE (ICD-595.0) UA positive. Start Bactrim DS one two times a day for three days. Advised fluid push, cranberr yjuice and void post coitus. Update if worse. Agrees.  Her updated medication list for this problem includes:    Pyridium 200 Mg Tabs (Phenazopyridine hcl) .Marland Kitchen... Three times a day    Bactrim Ds 800-160 Mg Tabs (Sulfamethoxazole-trimethoprim) .Marland Kitchen..Marland Kitchen Two times a day    Pyridium 200 Mg Tabs (Phenazopyridine hcl) .Marland Kitchen... Three times a day   Complete Medication List: 1)  Lyrica 50mg   .... One tablet tid 2)  Aciphex 20 Mg Tbec (Rabeprazole sodium) .... One daily 3)  Pyridium 200 Mg Tabs (Phenazopyridine hcl) .... Three times a day 4)  Ec-naprosyn 500 Mg Tbec (Naproxen) .... Two times a day 5)  Apnea Link Screen  .... Snoring and night time choking. 6)  Bactrim Ds 800-160 Mg Tabs (Sulfamethoxazole-trimethoprim) .... Two  times a day 7)  Pyridium 200 Mg Tabs (Phenazopyridine hcl) .... Three times a day  Other Orders: UA Dipstick w/o Micro (manual) ([id number])   Patient Instructions: 1)  As scheduled. Sooner if needed.    Prescriptions: PYRIDIUM 200 MG  TABS (PHENAZOPYRIDINE HCL) three times a day  #6 x 0   Entered and Authorized by:   Franchot Heidelberg MD   Signed by:   Franchot Heidelberg MD on 04/28/2007   Method used:   Print then Give to Patient   RxID:   [id number] BACTRIM DS 800-160 MG  TABS (SULFAMETHOXAZOLE-TRIMETHOPRIM) two times a day  #3 x 0   Entered and Authorized by:   Franchot Heidelberg MD   Signed by:   Franchot Heidelberg MD on 04/28/2007   Method used:   Print then Give to Patient   RxID:   [id number]  ] Laboratory Results   Urine Tests    Routine Urinalysis   Color: yellow Appearance: Clear Glucose: negative   (Normal Range: Negative) Bilirubin: negative   (Normal Range: Negative) Ketone: negative   (Normal Range: Negative) Spec. Gravity: 1.025   (Normal Range: 1.003-1.035) Blood: negative   (Normal Range: Negative) pH: 7.0   (Normal Range: 5.0-8.0) Protein: 30   (Normal Range: Negative) Urobilinogen: 0.2   (Normal Range: 0-1) Nitrite: positive   (Normal Range: Negative) Leukocyte Esterace: moderate   (Normal Range: Negative)

## 2010-04-10 NOTE — Assessment & Plan Note (Signed)
Summary: FLU SHOT/SLJ  Nurse Visit    Prior Medications: LYRICA 50MG  () one tablet tid ACIPHEX 20 MG  TBEC (RABEPRAZOLE SODIUM) One daily PYRIDIUM 200 MG  TABS (PHENAZOPYRIDINE HCL) three times a day EC-NAPROSYN 500 MG  TBEC (NAPROXEN) two times a day APNEA LINK SCREEN () Snoring and night time choking. BACTRIM DS 800-160 MG  TABS (SULFAMETHOXAZOLE-TRIMETHOPRIM) two times a day PYRIDIUM 200 MG  TABS (PHENAZOPYRIDINE HCL) three times a day METOCLOPRAMIDE HCL 10 MG TABS (METOCLOPRAMIDE HCL) 1 by mouth q 6 hours as needed headache Current Allergies: ! VICODIN   Influenza Vaccine    Vaccine Type: Fluvax Non-MCR    Site: left deltoid    Mfr: GlaxoSmithKline    Dose: 0.5 ml    Route: IM    Given by: Sherilyn Banker    Exp. Date: 09/07/2008    Lot #: [id number]    VIS given: 10/02/06 version given December 10, 2007.  Flu Vaccine Consent Questions    Do you have a history of severe allergic reactions to this vaccine? no    Any prior history of allergic reactions to egg and/or gelatin? no    Do you have a sensitivity to the preservative Thimersol? no    Do you have a past history of Guillan-Barre Syndrome? no    Do you currently have an acute febrile illness? no    Have you ever had a severe reaction to latex? no    Vaccine information given and explained to patient? yes    Are you currently pregnant? no   Orders Added: 1)  Influenza Vaccine NON MCR [[id number]]    ]  Complete Medication List: 1)  Lyrica 50mg   .... One tablet tid 2)  Aciphex 20 Mg Tbec (Rabeprazole sodium) .... One daily 3)  Pyridium 200 Mg Tabs (Phenazopyridine hcl) .... Three times a day 4)  Ec-naprosyn 500 Mg Tbec (Naproxen) .... Two times a day 5)  Apnea Link Screen  .... Snoring and night time choking. 6)  Bactrim Ds 800-160 Mg Tabs (Sulfamethoxazole-trimethoprim) .... Two times a day 7)  Pyridium 200 Mg Tabs (Phenazopyridine hcl) .... Three times a day 8)  Metoclopramide Hcl 10 Mg Tabs (Metoclopramide hcl)  .Marland Kitchen.. 1 by mouth q 6 hours as needed headache

## 2010-04-10 NOTE — Progress Notes (Signed)
Summary: Left AMA from [facility] ED  Phone Note Call from Patient   Summary of Call: After patient called here three times this morning and hung up after last call to make an appt  I contacted her to find out what was going on. She initially called here with complaint of SOB and left leg swelling. She was advised ED eval and agreed. She then called back while in ED not wanting to wait and requesting I order CXR. Advised nurse to advise her that if in ED at [facility], MD there would eval and treat appropriately. She hung up on nursing. Shen then called back a 3d time wanting order for CXR and apparantly had left ED. I thus called [facility] ED and spoke with Dr. Gwendolyn Fill who advised her tests were being ordered to eval including CXR. Working dx was chest wall pain at time.  She however left AMA as she was not willing to wait.   I thus called her and inquired what happend. She states she is SOB and fears her pneumonia is worse. She did not report any fever, chills or sweats or significant cough. She stated she was just worried. I advised her I did not understand why she would leave the ER AMA when eval was being done and if truly having symptoms of penumonia with this degree of concern. She stated she felt like she was just going crazy today and could not explain her behavior. I advised her that I suspected her panick attakcs plays a major role in her symptoms and advised only way to know for sure was to do eval. She requested I order her a CXR and I advised her I cannot do this without eval (needs O2 sat, lung exam etc. as CXR at last hospitalization only showed bibasliar atlectasis and poor inspiratory effort).   I advised her I would be happy to work her in as an acute visit for this today and offered appt this afternoon. She then declined stating she wouldl just come in for her routine appt October 27, 2008. Advised immedite update if worse. Initial call taken by: Franchot Heidelberg MD,  October 20, 2008 11:54  AM

## 2010-04-10 NOTE — Assessment & Plan Note (Signed)
Summary: foot swollen and hurts/arc   Vital Signs:  Patient Profile:   30 Years Old Female Height:     66 inches (167.64 cm) Weight:      244 pounds BMI:     39.52 O2 Sat:      96 % Temp:     97.4 degrees F Pulse rate:   92 / minute Resp:     16 per minute BP sitting:   140 / 98  Vitals Entered By: Sherilyn Banker (June 17, 2006 2:06 PM)               Visit Type:  Acute PCP:  Franchot Heidelberg, MD  Chief Complaint:  L foot and ankle swelling.  History of Present Illness: Pt in for an acute visit.  She has left ankle pain. She denies hx of trauma. She had an xray in February and this showd a tiny avulsion fracture of her talus. She was given a support brace and notes this helped some. It caused swelling though. She states she can bear weight on her foot and she notes it hurts more at night, turning on it and wiggling foot. She states sx has not improved much. She notes no redness or swelling but does get little shooting aches in lower leg at times.  Now presents.    Prior Medications (reviewed today): ULTRAM ER 100 MG TB24 (TRAMADOL HCL) once daily Current Allergies (reviewed today): ! 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 Lovell Sheehan   Family History:    Reviewed history from 05/08/2006 and no changes required:       Father: Dead 33 Lung Cancer       Mother: Dead 08-21-22 cervical cancer       Siblings: 3 Sisters: DM, 3 Brothers  W&L       Kids: [id number]  Social History:    Reviewed history from 05/08/2006 and no changes required:       Occupation: Psychologist, forensic       Married       Never Smoked       Alcohol use-no       Drug use-no    Review of Systems      See HPI   Physical Exam  General:     Well-developed,well-nourished,in no acute distress; alert,appropriate and cooperative throughout examination. Obese. Lungs:     Normal  respiratory effort, chest expands symmetrically. Lungs are clear to auscultation, no crackles or wheezes. Heart:     Normal rate and regular rhythm. S1 and S2 normal without gallop, murmur, click, rub or other extra sounds. Abdomen:     Bowel sounds positive,abdomen soft and non-tender without masses, organomegaly or hernias noted. Extremities:     No clubbing, cyanosis, edema, or deformity noted with normal full range of motion of all joints except left ankle. She has area of bruising left lateral ankle with mild edema overlying area below inferior malleolus. Tender here. ROM fair with pain in all directions - worse with rotation and flexion. Dorsalis pedis pulse intact and sensation grossly normal.      Impression & Recommendations:  Problem # 1:  ANKLE PAIN, LEFT (ICD-719.47) Repeat Xray as findings today represents major change compared to last exam. Will place in cam walker and await Xray report. Cont Ibuprofen 200 mg two three times daily  for pain. Must elevate, rest and ice it as well and specific instructions were given. If xray shows lack of improvement or worseing, will need Ortho referal. Pt agrees. Orders: T-Ankle x-ray, complete 3 views ([id number])    Patient Instructions: 1)  Please schedule a follow-up appointment in 2 weeks - sooner if needed.

## 2010-04-10 NOTE — Progress Notes (Signed)
Summary: medicine  Phone Note Call from Patient   Summary of Call: Patient called in and would like to know if dr. Erby Pian will call her in some medicine.  [phone] please advise Initial call taken by: Curtis Sites,  November 02, 2007 2:33 PM  Follow-up for Phone Call        left message for pt to call. Follow-up by: Sherilyn Banker,  November 02, 2007 4:03 PM  Additional Follow-up for Phone Call Additional follow up Details #1::        pt notified that she will need to be seen. Additional Follow-up by: Sherilyn Banker,  November 02, 2007 4:15 PM

## 2010-04-10 NOTE — Assessment & Plan Note (Signed)
Summary: office follow up /slj   Vital Signs:  Patient Profile:   30 Years Old Female Height:     66 inches (167.64 cm) Weight:      252 pounds BMI:     40.82 O2 Sat:      99 % Pulse rate:   82 / minute Resp:     12 per minute BP sitting:   150 / 109  Vitals Entered By: Sherilyn Banker (March 18, 2008 9:39 AM)                 PCP:  Franchot Heidelberg, MD  Chief Complaint:  follow up visit.  History of Present Illness: Pt in for recheck.  Brenda Richardson states Brenda Richardson is doing fair. Brenda Richardson feels somewhat sluggish and has been sleeping poorly. Statescan sleep some night and not others. Brenda Richardson goes to bed at 11 and wakes up at about 6h50. Brenda Richardson states has trouble falling asleep. Brenda Richardson states thinks about sleeping. Will worry about bills. Brenda Richardson is irritable. Brenda Richardson has poor concentration per spouse who is present. Energy is low. Brenda Richardson states would not say feels depressed. Brenda Richardson tried Excedrin PM and it helps - gets sleepy but then tosses and turns a lot for 10 minutes. Brenda Richardson watches tv before bed. Brenda Richardson does not watch the alarm clock. Brenda Richardson drinks some soda at night occasionally.   Brenda Richardson has chronic low back pain and Brenda Richardson went to PT. Brenda Richardson does not have copay for more at this time and states symotoms are better. Still gets sharp pains and notes worse with getting up and down. Brenda Richardson denies weakness and falls. Brenda Richardson is not using any Rx at this time.   Brenda Richardson now presents.  Hypertension History:      Brenda Richardson complains of headache, but denies palpitations, dyspnea with exertion, orthopnea, PND, peripheral edema, visual symptoms, neurologic problems, syncope, and side effects from treatment.  Brenda Richardson notes no problems with any antihypertensive medication side effects.  Further comments include: Brenda Richardson has occasionally headaches. BP remains high. Has used Reglan for headaches and states some but not a lot of relief.  Brenda Richardson is watching salt. Brenda Richardson is not exersizing. Brenda Richardson is watching portions. Brenda Richardson has lost two pounds. .        Positive major cardiovascular risk  factors include hypertension.  Negative major cardiovascular risk factors include female age less than 30 years old, no history of diabetes, negative family history for ischemic heart disease, and non-tobacco-user status.        Positive history for target organ damage include cardiac end organ damage (either CHF or LVH).  Further assessment for target organ damage reveals no history of ASHD, stroke/TIA, or peripheral vascular disease.    Lipid Management History:      Positive NCEP/ATP III risk factors include hypertension.  Negative NCEP/ATP III risk factors include female age less than 11 years old, no history of early menopause without estrogen hormone replacement, non-diabetic, no family history for ischemic heart disease, non-tobacco-user status, no ASHD (atherosclerotic heart disease), no prior stroke/TIA, no peripheral vascular disease, and no history of aortic aneurysm.        The patient states that Brenda Richardson knows about the "Therapeutic Lifestyle Change" diet.  Her compliance with the TLC diet is fair.  The patient expresses understanding of adjunctive measures for cholesterol lowering.        Prior Medications Reviewed Using: Medication Bottles  Updated Prior Medication List: METOCLOPRAMIDE HCL 10 MG TABS (METOCLOPRAMIDE HCL) One every 6 hours as needed  Current Allergies (reviewed today): ! 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)       VENTRICULAR HYPERTROPHY, LEFT (ICD-429.3)       CYSTITIS, ACUTE (ICD-595.0)       COSTOCHONDRITIS, RECURRENT (ICD-733.6)       DENTAL CARIES (ICD-521.00)       OBESITY NOS (ICD-278.00)       ESSENTIAL HYPERTENSION (ICD-401.9)       HEMORRHOIDS, INTERNAL (ICD-455.0)       MIGRAINE HEADACHE (ICD-346.90)       CONSTIPATION NOS (ICD-564.00)       GERD (ICD-530.81)         Past Surgical History:    Reviewed history from 02/12/2008 and no changes  required:       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 Lovell Sheehan       Hysterectomy - June 2009 - no cancer cells       Epidurals low back - 2008 x 2   Family History:    Reviewed history from 05/08/2006 and no changes required:       Father: Dead 50 Lung Cancer       Mother: Dead 2022/08/27 cervical cancer       Siblings: 3 Sisters: DM, 3 Brothers  W&L       Kids: [id number]  Social History:    Reviewed history from 05/08/2006 and no changes required:       Occupation: Psychologist, forensic       Married       Never Smoked       Alcohol use-no       Drug use-no    Review of Systems      See HPI  General      Denies chills, fever, and sweats.  Resp      Denies cough, shortness of breath, sputum productive, and wheezing.  GI      Denies abdominal pain, constipation, diarrhea, nausea, and vomiting.  GU      Denies nocturia, urinary frequency, and urinary hesitancy.   Physical Exam  General:     Obese, in no acute distress. Alert and oriented X 3.  Lungs:     Normal respiratory effort, chest expands symmetrically. Lungs are clear to auscultation, no crackles or wheezes. Heart:     Normal rate and regular rhythm. S1 and S2 normal without gallop, murmur, click, rub or other extra sounds. Abdomen:     Bowel sounds positive,abdomen soft and non-tender without masses, organomegaly or hernias noted. No CVA tenderness. Difficult exam due to obesity. No obvious abnormality. Extremities:     No clubbing, cyanosis, edema, or deformity noted with normal full range of motion of all joints.   Psych:     Cognition and judgment appear intact. Alert and cooperative with normal attention span and concentration. No apparent delusions, illusions, hallucinations    Impression & Recommendations:  Problem # 1:  ESSENTIAL HYPERTENSION, BENIGN (ICD-401.1) BP high. Has been on multiple occasions. Trial  low dose Amlodipine. Councelled risk and benefit. Her updated medication list for this problem includes:    Amlodipine Besylate 5 Mg Tabs (Amlodipine besylate) ..... One daily  Orders: T-Comprehensive Metabolic Panel [id number]) T-Lipid Profile [id number]) T-TSH (  [id number])   Problem # 2:  INSOMNIA (ICD-780.52) Councelled sleep hygiene. Trial Trazodone as Brenda Richardson appears depressed. Advised risk and benefit. Agrees. Recheck 4 weeks.  Orders: T-TSH ([id number])   Problem # 3:  LOW BACK PAIN, MILD (ICD-724.2) Stable. OTC Tylenol as needed. Brenda Richardson self stopped PT due to copays and advised to cont home back exersizes.  Problem # 4:  MIGRAINE HEADACHE (ICD-346.90) Discussed. Rx as per Dr. Jen Mow. Suspect optomizing BP may benefit. Advised eye care.  Problem # 5:  OBESITY NOS (ICD-278.00) Again advised importance of weight loss with diet, exersize and portion control. Councelled how this affects BP, back pain and atherosclerotic risk. Recheck 4 weeks. Orders: T-Comprehensive Metabolic Panel [id number]) T-Lipid Profile [id number])   Complete Medication List: 1)  Metoclopramide Hcl 10 Mg Tabs (Metoclopramide hcl) .... One every 6 hours as needed 2)  Amlodipine Besylate 5 Mg Tabs (Amlodipine besylate) .... One daily 3)  Trazodone Hcl 50 Mg Tabs (Trazodone hcl) .... One daily at bedtime  Hypertension Assessment/Plan:      The patient's hypertensive risk group is category C: Target organ damage and/or diabetes.  Today's blood pressure is 150/109.  Her blood pressure goal is < 140/90.  Lipid Assessment/Plan:      Based on NCEP/ATP III, the patient's risk factor category is "0-1 risk factors".  From this information, the patient's calculated lipid goals are as follows: Total cholesterol goal is 200; LDL cholesterol goal is 160; HDL cholesterol goal is 40; Triglyceride goal is 150.     Patient Instructions: 1)  Please schedule a follow-up appointment in 1  month.   Prescriptions: TRAZODONE HCL 50 MG TABS (TRAZODONE HCL) One daily at bedtime  #30 x 0   Entered and Authorized by:   Franchot Heidelberg MD   Signed by:   Franchot Heidelberg MD on 03/18/2008   Method used:   Print then Give to Patient   RxID:   [id number] AMLODIPINE BESYLATE 5 MG TABS (AMLODIPINE BESYLATE) One daily  #90 x 0   Entered and Authorized by:   Franchot Heidelberg MD   Signed by:   Franchot Heidelberg MD on 03/18/2008   Method used:   Print then Give to Patient   RxID:   [id number]  ]

## 2010-04-10 NOTE — Letter (Signed)
Summary: Historic Patient File  Historic Patient File   Imported By: Micah Flesher, LPN 04/54/0981 19:14:78  _____________________________________________________________________  External Attachment:    Type:   Image     Comment:   External Document

## 2010-04-10 NOTE — Progress Notes (Signed)
Summary: Headache, chest pain  Phone Note Call from Patient   Caller: Patient Reason for Call: Acute Illness, Talk to Nurse Complaint: Chest Pain Action Taken: Patient advised to go to ER Summary of Call: Pt called c/o chest pain & feeling "terrible".  Advised pt to go to ER, pt refused, wanted appt for today.  Stressed the importance of going to ER, because of the chest pains, pt still refuses.  Pt given appt for this afternoon.  Office mgr adivised pt to go to ER, pt refused. Initial call taken by: Micah Flesher, LPN,  March 28, 2008 9:03 AM

## 2010-04-10 NOTE — Assessment & Plan Note (Signed)
Summary: ANEMIA/SS   Visit Type:  Follow-up Visit Referring Vela Render:  [facility] Veanna Dower:  Maralyn Sago  CC:  anemia.  History of Present Illness: Brenda Richardson is a 30 year old African American female who presents today at request of Dr. Truddie Hidden regarding ongoing anemia. She was recently seen in follow-up at their office on 11/17/09 where extensive labs were drawn. We do not have these available today, but they have been requested. Her main complaint is of dysphagia in mid-epigastric area with liquids and tough textures such as pork, chicken. She admits that this occurs each meal. +nausea intermittently, unrelated to eating/drinking X 1 year. Denies abdominal pain, vomiting. Denies constipation. BM 1-3X daily. Has noted streaky bright red blood on tissue with wiping after "straining" for BM, but this is not common. Denies pain, itching, burning of rectal area. Concerned about weight gain and states her "belly is getting bigger". Wt reportedly 248 in November, now 270. Denies sweets, breads. Does skip meals daily, only eats 1 meal per day (usually dinner or lunch). No exercise. Last EGD performed 8/08 for N/V,  which showed florid gastroesophageal reflux but normal esophageal mucosa, patulous EG junction, small hiatal hernia, otherwise normal stomach. Last colonoscopy: 9/07 due to low volume hematochezia: showed anal canal hemorrhoids, normal rectum, colon, terminal ileum.    Current Problems (verified): 1)  Hematochezia  (ICD-578.1) 2)  Anemia-unspecified  (ICD-285.9) 3)  Dysphagia  (ICD-787.29) 4)  Abdominal Pain, Generalized  (ICD-789.07) 5)  Diverticulitis, Colon  (ICD-562.11) 6)  Epigastric Pain  (ICD-789.06) 7)  Nausea and Vomiting  (ICD-787.01) 8)  Pelvic Pain  (ICD-789.09) 9)  Abdominal Pain  (ICD-789.00) 10)  Liver Lesions  () 11)  Hyperglycemia  (ICD-790.29) 12)  Bipolar Affective Disorder  (ICD-296.80) 13)  Proteinuria  (ICD-791.0) 14)  Anxiety Depression   (ICD-300.4) 15)  Chest Pain  (ICD-786.50) 16)  Essential Hypertension, Benign  (ICD-401.1) 17)  Low Back Pain, Mild  (ICD-724.2) 18)  Ventricular Hypertrophy, Left  (ICD-429.3) 19)  Costochondritis, Recurrent  (ICD-733.6) 20)  Obesity Nos  (ICD-278.00) 21)  Hemorrhoids, Internal  (ICD-455.0) 22)  Migraine Headache  (ICD-346.90) 23)  Constipation Nos  (ICD-564.00) 24)  Gerd  (ICD-530.81)  Current Medications (verified): 1)  Valium 5 Mg Tabs (Diazepam) .... One To Two At Night As Needed For Anxiety Per Dr. Lolly Mustache 2)  Amitriptyline Hcl 50 Mg Tabs (Amitriptyline Hcl) .... As Needed 3)  Klonopin 1 Mg Tabs (Clonazepam) .... Once Daily 4)  Keppra 500 Mg Tabs (Levetiracetam) .... Two Times A Day  Allergies (verified): 1)  ! Vicodin 2)  ! Nubain (Nalbuphine Hcl) 3)  ! Toradol 4)  ! 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  Jenkins Hysterectomy - June 2009 - no cancer cells Epidurals low back - 2008 x 2 Breast  reduction - 44 DDD to 44 C - June 2010 BSO April 2011  Review of Systems General:  Denies fever, chills, and anorexia. Eyes:  Denies blurring, irritation, and vision loss. ENT:  Denies nasal congestion, sore throat, and hoarseness. CV:  Complains of peripheral edema; denies chest pains and dyspnea on exertion; states chronic left lower extremity swelling . Resp:  Denies dyspnea at rest and cough. GI:  Complains of difficulty swallowing and nausea; denies pain on swallowing, indigestion/heartburn, abdominal pain, change in bowel habits, and bloody BM's. GU:  Denies urinary burning and blood in urine. MS:  Denies joint pain / LOM, joint swelling, and joint stiffness. Derm:  Denies rash, itching, and dry skin. Neuro:  Denies weakness, headache, and confusion. Psych:  Denies depression and anxiety.  Vital Signs:  Patient profile:   30 year old female Height:      66 inches Weight:      270 pounds BMI:     43.74 Temp:     98.1 degrees F oral Pulse rate:   64 / minute BP sitting:   122 / 80  (left arm) Cuff size:   large  Vitals Entered By: Brenda Richardson (December 11, 2009 11:00 AM)  Physical Exam  General:  healthy appearing and obese.  no distress noted Eyes:  Sclera clear, no icterus. Nose:  No deformity, discharge,  or lesions. Mouth:  No deformity or lesions, dentition normal. Neck:  Supple; no masses or thyromegaly. Lungs:  Clear throughout to auscultation. Heart:  Regular rate and rhythm; no murmurs, rubs,  or bruits. Abdomen:  normal bowel sounds, obese, without guarding, without rebound, and no hepatomegally or splenomegaly.   Msk:  Symmetrical with no gross deformities. Normal posture. Extremities:  without edema bilaterally Neurologic:  Alert and  oriented x4;  grossly normal neurologically. Skin:  Intact without significant lesions or rashes. Cervical Nodes:  No significant cervical adenopathy. Psych:  Alert and cooperative. Normal mood and affect.  Impression &  Recommendations:  Problem # 1:  DYSPHAGIA (ICD-66.84) 30 year old female with a reported history of dysphagia X 1 year. Reports mid-epigastric dysphagia with liquids and tough textures, each meal. + Nausea X 1 year as well, intermittent, not related to eating or drinking. Last EGD done 8/08.   Will set up for EGD with possible ED. Likely diagnoses include: esophageal web, ring, or stricture.   Orders: Est. Patient Level IV (16109)  Problem # 2:  ANEMIA-UNSPECIFIED (ICD-285.9) Chronic anemia of unknown etiology; no labs available to review. Will request labs to determine etiology. If iron deficiency anemia, will most likely need colonoscopy with EGD. Pt aware of this and is agreeable. Last colonoscopy 2007.  Diagnostic colonoscopy and EGD w/ possible esophageal dilation to be performed by Dr. Suszanne Conners Rourk in the near future.  I have discussed risks and benefits which include, but are not limited to, bleeding, infection, perforation, or medication reaction.  The patient agrees with this plan and consent will be obtained.   Will Review labs & set up procedures.  Orders: Est. Patient Level IV (60454)  Problem # 3:  OBESITY NOS (ICD-278.00) 20 lb weight gain reportedly since November. Skips meals, no exercise. Pt expressed interest in gastric bypass. Encouraged to follow-up with PCP.  Will refer to dietician for nutrition counseling Follow-up with PCP regarding other weight-management/loss strategies  Orders: Est. Patient Level IV (09811)  Problem #  4:  HEMATOCHEZIA (ICD-578.1) See #2.  ? benign anorectal source, but given last TCS approx 4 yrs ago with IDA, would pursue colonoscopy r/o colorectal CA.  Patient Instructions: 1)  EGD with possible ED 2)  Possible colonoscopy depending on lab results 3)  Dietician Consult 4)  Pt informed of risks and benefits of both procedures in detail. Stated understanding, agrees, and has given verbal consent.   Appended Document:  ANEMIA/SS Please set up for EGD with poss ED and TCS in OR due to polypharmacy. Reason: dypshagia, hematochezia and microcytic anemia.   Appended Document: ANEMIA/SS Pt scheduled for procedure 01/04/10@8 :30am

## 2010-04-10 NOTE — Consult Note (Signed)
Summary: [facility]  [facility]   Imported By: Pablo Lawrence 09/25/2006 14:57:37  _____________________________________________________________________  External Attachment:    Type:   Image     Comment:   External Document

## 2010-04-10 NOTE — Letter (Signed)
Summary: PHQ-9  PHQ-9   Imported By: Lutricia Horsfall 03/29/2008 15:52:51  _____________________________________________________________________  External Attachment:    Type:   Image     Comment:   External Document

## 2010-04-10 NOTE — Assessment & Plan Note (Signed)
Summary: Chest pain/rrs   Vital Signs:  Patient Profile:   30 Years Old Female Height:     66 inches (167.64 cm) Weight:      246 pounds BMI:     39.85 O2 Sat:      98 % Pulse rate:   81 / minute Resp:     14 per minute BP sitting:   163 / 112  Vitals Entered By: Sherilyn Banker (March 28, 2008 3:12 PM)                 PCP:  Franchot Heidelberg, MD  Chief Complaint:  headache and chest pains.  History of Present Illness: Pt in for acute visit.  She states began feeling poorly yesterday morning. Woke up this way. She states she has a headache. States frontal. States sharp. Comes and goes. Worse with nothing. Better with nothing. She used Advil 2 yesterday and had no relief. She states headache localized. She states hurts in neck and states dizzy too. She states she feels swimmy headed and states feels like falling over when walking. She states feels like she is spinning. She denies runny nnose and ear pain and adds ringing in ears at times. States has had since last week. She denies sore throat. She denies cough and wheeze. She does hurt in her chest. States sharp pain. Comes and goes and present since yesterday. She denies othopnea, PND and states heart races. She has a decreased apetite. She has lost weight since last visit and dtates just not hungry. She denies vomitting but feels nauseated. She states stools lose. She denies constiaption. She denies urinary symptoms. She states not doing well emotionally. She is irritable. Her concentration wonders per spouse who is present. She tosses and turns a lot at night. She wakes frequently. She is home with kids all the time and rarely gets out. States husband home after back surgery. She has stress in finances, caring for kids, doing housework. She stopped Trazodone as she did not feel helped. Her recent labs are reviewed:  1. CBC - PLTS at 467 2. CMP - normal 3. Lipids - see below 4. TSH - normal  She now presents.  Hypertension  History:      She notes no problems with any antihypertensive medication side effects.  Further comments include: She is using Amlodipine daily. Just started after last visit. Does not think working as BP high. Wacthing salt. She has begun exersize regimen and started last Thursday. Helping and felt better starting this. .        Positive major cardiovascular risk factors include hypertension.  Negative major cardiovascular risk factors include female age less than 51 years old, no history of diabetes, negative family history for ischemic heart disease, and non-tobacco-user status.        Positive history for target organ damage include cardiac end organ damage (either CHF or LVH).  Further assessment for target organ damage reveals no history of ASHD, stroke/TIA, or peripheral vascular disease.    Lipid Management History:      Positive NCEP/ATP III risk factors include hypertension.  Negative NCEP/ATP III risk factors include female age less than 25 years old, no history of early menopause without estrogen hormone replacement, non-diabetic, no family history for ischemic heart disease, non-tobacco-user status, no ASHD (atherosclerotic heart disease), no prior stroke/TIA, no peripheral vascular disease, and no history of aortic aneurysm.        The patient states that she knows about the "Therapeutic Lifestyle  Change" diet.  Her compliance with the TLC diet is fair.  The patient expresses understanding of adjunctive measures for cholesterol lowering.        Prior Medications Reviewed Using: Patient Recall  Updated Prior Medication List: METOCLOPRAMIDE HCL 10 MG TABS (METOCLOPRAMIDE HCL) One every 6 hours as needed AMLODIPINE BESYLATE 5 MG TABS (AMLODIPINE BESYLATE) One daily   Current Allergies (reviewed today): ! 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)       VENTRICULAR HYPERTROPHY,  LEFT (ICD-429.3)       CYSTITIS, ACUTE (ICD-595.0)       COSTOCHONDRITIS, RECURRENT (ICD-733.6)       DENTAL CARIES (ICD-521.00)       OBESITY NOS (ICD-278.00)       ESSENTIAL HYPERTENSION (ICD-401.9)       HEMORRHOIDS, INTERNAL (ICD-455.0)       MIGRAINE HEADACHE (ICD-346.90)       CONSTIPATION NOS (ICD-564.00)       GERD (ICD-530.81)         Past Surgical History:    Reviewed history from 02/12/2008 and no changes required:       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 Lovell Sheehan       Hysterectomy - June 2009 - no cancer cells       Epidurals low back - 2008 x 2    Risk Factors: Tobacco use:  never Drug use:  no Alcohol use:  no  Family History Risk Factors:    Family History of MI in females < 43 years old:  no    Family History of MI in males < 48 years old:  no  Colonoscopy History:    Date of Last Colonoscopy:  10/21/2005  PAP Smear History:    Date of Last PAP Smear:  10/21/2005   Review of Systems      See HPI   Physical Exam  General:     Obese, in no acute distress. Alert and oriented X 3. Flat affect. Irritable with children and spouse.  Head:     Normocephalic and atraumatic without obvious abnormalities. No apparent alopecia or balding. Eyes:     No corneal or conjunctival inflammation noted. EOMI. Perrla. Ears:     External ear exam shows no significant lesions or deformities.  Otoscopic examination reveals clear canals, tympanic membranes are intact bilaterally without bulging, retraction, inflammation or discharge. Hearing is grossly normal bilaterally. Nose:     External nasal examination shows no deformity or inflammation. Nasal mucosa are pink and moist without lesions or exudates. Mouth:     Oral mucosa and oropharynx without lesions or exudates.   Neck:     No deformities, masses, or tenderness noted. Lungs:     Normal respiratory effort, chest  expands symmetrically. Lungs are clear to auscultation, no crackles or wheezes. Heart:     Normal rate and regular rhythm. S1 and S2 normal without gallop, murmur, click, rub or other extra sounds. Abdomen:     Bowel sounds positive,abdomen soft and non-tender without masses, organomegaly or hernias noted. Extremities:     No clubbing, cyanosis, edema, or deformity noted with normal full range of motion of all joints.   Neurologic:  No cranial nerve deficits noted. Station and gait are normal. Plantar reflexes are down-going bilaterally. DTRs are symmetrical throughout. Sensory, motor and coordinative functions appear intact. Tension in shoulders noted. Cervical Nodes:     No lymphadenopathy noted Psych:     Cognition and judgment appear intact. Alert and cooperative with normal attention span and concentration. No apparent delusions, illusions, hallucinations    Impression & Recommendations:  Problem # 1:  HEADACHE (ICD-784.0) Discussed. Single dose Reglan IM And single dose Toradol IM. See below. Suspect tension headache associated with depression and anxiety. Advised risk and benefit of medication use. Agreeable. Nurse to administer.  Problem # 2:  CHEST PAIN (ICD-786.50) Normal EKG. BP high but agitted with spouse and children around. Rechecked at end of visit and improved at 141/103. Advised Amlodipine as directed with low salt intake and exersize as directed previuosly. Agrees. Orders: EKG w/ Interpretation (93000)   Problem # 3:  INSOMNIA (ICD-780.52) Rx as below.  Problem # 4:  ANXIETY DEPRESSION (ICD-300.4) PHQ 9 done and reviewed. She scored 16 and falls into the mod-severe depressed scale. Advised off this and discussed disease and symptoms. I suspect she is overwhelmed at home with 3 children and ill spouse. I advised her insomnia likley relted to the above and supsect somatic symptoms as well in lieu of essentially normal labs and  EKG. I discussed need to make time for  self, spent time alone with spouse and educated her on stress coping skills with children i.e. have baby sitter once in while, split them up for mom and dad time etc.I believe she would benefit from councelling as this will give her an outlet for her frustrations and also give her some quiet time away from kids. Father and children edcuated on this as they were all present for exam. Agrees. I will add low dose Cymbalta 30mg  daily and increase to 60 mg after one week. Advied risk and benefit and councelled need to be patient as will take time to work. Encouraged good sleep hygiene. Update if condition worsens. See me immediately if this is case, otherwise in 2 weeks. Agrees.   Problem # 5:  ESSENTIAL HYPERTENSION, BENIGN (ICD-401.1) See above. Her updated medication list for this problem includes:    Amlodipine Besylate 5 Mg Tabs (Amlodipine besylate) ..... One daily   Complete Medication List: 1)  Metoclopramide Hcl 10 Mg Tabs (Metoclopramide hcl) .... One every 6 hours as needed 2)  Amlodipine Besylate 5 Mg Tabs (Amlodipine besylate) .... One daily  Hypertension Assessment/Plan:      The patient's hypertensive risk group is category C: Target organ damage and/or diabetes.  Her calculated 10 year risk of coronary heart disease is 1 %.  Today's blood pressure is 163/112.  Her blood pressure goal is < 140/90.  Lipid Assessment/Plan:      Based on NCEP/ATP III, the patient's risk factor category is "0-1 risk factors".  From this information, the patient's calculated lipid goals are as follows: Total cholesterol goal is 200; LDL cholesterol goal is 160; HDL cholesterol goal is 40; Triglyceride goal is 150.     Patient Instructions: 1)  Please schedule a follow-up appointment in 2 weeks.     EKG  Procedure date:  03/28/2008  Findings:      normal:  HR 80 BPM and sinus.    Medication Administration  Injection # 1:    Medication: Ketorolac-Toradol 15mg     Diagnosis: CHEST PAIN  (ICD-786.50)    Route: IM  Site: L thigh    Exp Date: 10/09/2008    Lot #: [id number]    Mfr: hospira    Patient tolerated injection without complications    Given by: Sherilyn Banker (March 28, 2008 4:09 PM)  Injection # 2:    Medication: Metoclopramide hcl up to 10mg     Diagnosis: HEADACHE (ICD-784.0)    Route: IM    Site: R thigh    Exp Date: 11/09/2008    Lot #: [id number]    Mfr: hospira    Patient tolerated injection without complications    Given by: Sherilyn Banker (March 28, 2008 4:10 PM)  Orders Added: 1)  Est. Patient Level IV [19147] 2)  EKG w/ Interpretation [93000]

## 2010-04-10 NOTE — Progress Notes (Signed)
Summary: chest pain  Phone Note Call from Patient   Caller: Patient Call For: Franchot Heidelberg MD Summary of Call: Patient called in wanted to talk to Selena Batten, I told her Selena Batten was in with a patient, could I take a message she said she was having Chest Pain, I told her she needed to go to the ER.  She then said her Right thumb only had a tingling feeling, I then told her to go to the ER again, she said she didn't want to go up there and sit all day.  she would like a Nurse to call her.   Initial call taken by: Curtis Sites,  April 09, 2007 2:14 PM  Follow-up for Phone Call        please call pt. Thank You. Follow-up by: Sherilyn Banker,  April 09, 2007 2:16 PM  Additional Follow-up for Phone Call Additional follow up Details #1::        c/o chest pain last night and today. About 1 hour ago said she had some tingling in ther R thumb. Denies SOB or tachypena. Advised ER visit. Additional Follow-up by: Sonny Dandy,  April 09, 2007 2:41 PM    Additional Follow-up for Phone Call Additional follow up Details #2::    Agree Follow-up by: Franchot Heidelberg MD,  April 09, 2007 3:04 PM

## 2010-04-10 NOTE — Progress Notes (Signed)
Summary: Appointment canceled  Phone Note Call from Patient   Caller: Patient Summary of Call: Patient called and canceled appointment, stating she was better.  24 hour notice to cancel not given. .................................................................Marland KitchenMarland KitchenSonny Dandy  March 26, 2007 1:51 PM   Follow-up for Phone Call        Noted. Follow-up by: Franchot Heidelberg MD,  March 26, 2007 1:57 PM

## 2010-04-10 NOTE — Letter (Signed)
Summary: disability med. records  disability med. records   Imported By: Curtis Sites 01/06/2009 11:04:37  _____________________________________________________________________  External Attachment:    Type:   Image     Comment:   External Document

## 2010-04-10 NOTE — Progress Notes (Signed)
Summary: wants nurse to call asap  Phone Note Call from Patient   Reason for Call: Talk to Nurse Summary of Call: wants nurse to call back asap.... Initial call taken by: Donneta Romberg,  May 12, 2006 4:06 PM  Follow-up for Phone Call        pt notified of xray results Follow-up by: Sherilyn Banker,  May 12, 2006 4:59 PM

## 2010-04-10 NOTE — Assessment & Plan Note (Signed)
Summary: 4 WEEK FOLLOW UP/ARC   Vital Signs:  Patient Profile:   30 Years Old Female Height:     66 inches (167.64 cm) Weight:      240 pounds BMI:     38.88 O2 Sat:      98 % Temp:     97.2 degrees F Pulse rate:   78 / minute Resp:     16 per minute BP sitting:   144 / 92  Pt. in pain?   no                Visit Type:  Follow-up PCP:  Franchot Heidelberg, MD  Chief Complaint:  L ankle swelling.  History of Present Illness: Pt seen for follow-up.  She has chronic abdominalpain. Present over lower abdomen. Sharp pain. Comes and goes. Has had colonoscopy and full GYN work-up. She was seen by Dr. Cira Servant  two days ago and has been referred to Dr. Lovell Sheehan for laprascopic surgery and possible appendectomy. Has elevated white count and feel it might be subclinical appendicitis.  She has left leg pain also. She states it started a while back. Swollen for one week. Rates pain as "really bad". States pain is tightness. Worse with walking or pressure on it. Better with nothing - has soaked it and applied ointment. No falls or injuries. She has no hx of gout.    Hypertension History:      She complains of chest pain, but denies headache, palpitations, dyspnea with exertion, orthopnea, PND, peripheral edema, visual symptoms, neurologic problems, syncope, and side effects from treatment.  She notes no problems with any antihypertensive medication side effects.  Further comments include: She staarted having chest pain this am. Pain behind breast bone. Lambert Mody, comes and goes. Lasts only a few seconds. Worse with nothing, better with nothing. She does have indigestion and thinks it is from this. Had heart work up w few years ago - told it was GERD. Does not take any routine meds. .        Positive major cardiovascular risk factors include hypertension.  Negative major cardiovascular risk factors include female age less than 54 years old and non-tobacco-user status.     Prior Medications (reviewed  today): ULTRAM ER 100 MG TB24 (TRAMADOL HCL) once daily Current Allergies (reviewed today): ! VICODIN   Family History:    Father: Dead 45 Lung Cancer    Mother: Dead 88 cervical cancer    Siblings: 3 Sisters: DM, 3 Brothers  W&L    Kids: [id number]  Social History:    Occupation: Psychologist, forensic    Married    Never Smoked    Alcohol use-no    Drug use-no   Risk Factors:  Tobacco use:  never Drug use:  no Alcohol use:  no   Review of Systems      See HPI   Physical Exam  General:     Well-developed,well-nourished,in no acute distress; alert,appropriate and cooperative throughout examination. Obese. Lungs:     Normal respiratory effort, chest expands symmetrically. Lungs are clear to auscultation, no crackles or wheezes. Heart:     Normal rate and regular rhythm. S1 and S2 normal without gallop, murmur, click, rub or other extra sounds. Abdomen:     Soft, tender lower abdomen, no rebound, no masses, BS normal Pulses:     R and L carotid,radial,femoral,dorsalis pedis and posterior tibial pulses are full and equal bilaterally Extremities:     Swelling left lateral malleolus with tenderness over  bone. No redness. Pain on ROM in all directions.  Psych:     Cognition and judgment appear intact. Alert and cooperative with normal attention span and concentration. No apparent delusions, illusions, hallucinations    Impression & Recommendations:  Problem # 1:  CHEST DISCOMFORT, ATYPICAL (ICD-786.59) EKG done today in light of sx and upcoming surgery. Result reviewed. Advised ptlikely GERD given past hx and low prestest probability. Trial of Aciphex with weight loss, diet and exersize a must. Avoid big meals before bedtime. Avoid precipitating foods. Recheck 6 weeks - sooner if sx persist.  Problem # 2:  ABDOMINAL PAIN, LOWER (ICD-789.09) Get records from Dr. Cira Servant and await input from Dr. Lovell Sheehan. may use Ultram every 6 hours as needed. Her updated medication list for  this problem includes:    Ultram Er 100 Mg Tb24 (Tramadol hcl) ..... Once daily   Problem # 3:  GERD (ICD-530.81) Seea bove  Problem # 4:  ANKLE PAIN, LEFT (ICD-719.47) Check lab and xray as below. Ultram for now. Place in ASO lacer and recheck 4 to 6 weeks -sooner if worse. Diff includes sprain, gout etc. Orders: T-Ankle x-ray, complete 3 views (16109) T-Uric Acid (60454-09811) Venipuncture (91478)   Problem # 5:  ESSENTIAL HYPERTENSION (ICD-401.9) Diet, exersize and low salt intake for now. Recheck 3 months and if elevated consider low dose diuretic. The following medications were removed from the medication list:    Hydralazine-hctz 25-25 Mg Caps (Hydralazine-hctz) ..... Once daily   Hypertension Assessment/Plan:      The patient's hypertensive risk group is category A: No risk factors and no target organ damage.  Today's blood pressure is 144/92.  Her blood pressure goal is < 140/90.  Appended Document: EKG report and billing      Prior Medications: ULTRAM ER 100 MG TB24 (TRAMADOL HCL) once daily Current Allergies: ! VICODIN        Impression & Recommendations:  Problem # 1:  CHEST DISCOMFORT, ATYPICAL (ICD-786.59)  Orders: EKG w/ Interpretation (93000)

## 2010-04-10 NOTE — Progress Notes (Signed)
Summary: Chest pain  Phone Note Other Incoming   Summary of Call: Pts husband called because Brenda Richardson is still having chest pain, & tingling in her arms. Initial call taken by: Sherilyn Banker LPN,  May 31, 2008 8:43 AM  Follow-up for Phone Call        Advise ED eval and refer Cardiology forfurther work-up as discussed yesterday. Follow-up by: Franchot Heidelberg MD,  May 31, 2008 8:52 AM  Additional Follow-up for Phone Call Additional follow up Details #1::        pt agrees to go to ED and has appt w/ cardiology on 06/08/08 at 12:00. Additional Follow-up by: Sherilyn Banker LPN,  May 31, 2008 9:10 AM

## 2010-04-10 NOTE — Progress Notes (Signed)
  Phone Note Call from Patient   Summary of Call: Pt called wanting to be put in hosp. because she beleives that she has a blood clot. We looked at her CT scan from [facility] ER visit and this shows no sign of a blood clot.  Initial call taken by: Sherilyn Banker LPN,  June 06, 2008 8:55 AM  Follow-up for Phone Call        Tried to call pt back, no answer. Pt has appt w/ Cardiology this Wed. Follow-up by: Sherilyn Banker LPN,  June 06, 2008 8:55 AM  Additional Follow-up for Phone Call Additional follow up Details #1::        Tried to call pt a second time. No answer. Additional Follow-up by: Sherilyn Banker LPN,  June 06, 2008 10:12 AM    Additional Follow-up for Phone Call Additional follow up Details #2::    Pt notified thast she needs to keep her appt. w/ Cardiology and I didreassure her of the CT findings. Follow-up by: Sherilyn Banker LPN,  June 06, 2008 10:30 AM

## 2010-04-10 NOTE — Progress Notes (Signed)
Summary: On call note - ST - may she try Benadryl  Phone Note Call from Patient   Caller: Patient Summary of Call: Pt called stating throat is sore. Wants to ty some benadryl as thinks allergies and PND may contrubute. Concern is using this with her Klonopin and not sure if she can. Advised to use low dose and monitor for sedation. May also try chloraseptic spray but advised me this makes her throw up. May thus try cepachol as alternative or even do salt water gargles. Update clinic in am if not better or concern. Agrees. Initial call taken by: Franchot Heidelberg MD,  Aug 02, 2008 8:31 AM
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
## 2010-04-10 NOTE — Letter (Signed)
Summary: Demographics  Demographics   Imported By: Micah Flesher, LPN 16/12/9602 54:09:81  _____________________________________________________________________  External Attachment:    Type:   Image     Comment:   External Document

## 2010-04-10 NOTE — Assessment & Plan Note (Signed)
Summary: FOLLOW UP 1 MONTH/SLJ   Vital Signs:  Patient Profile:   30 Years Old Female Height:     66 inches (167.64 cm) Weight:      244 pounds BMI:     39.52 O2 Sat:      96 % Pulse rate:   79 / minute Resp:     14 per minute BP sitting:   150 / 91  Vitals Entered By: Sherilyn Banker LPN (May 16, 8117 9:48 AM)                 PCP:  Franchot Heidelberg, MD  Chief Complaint:  BP follow up .  History of Present Illness: Pt in for recheck.  She started Amlodipine for her HTN. She denies side-effects and does not do home readings as scared.  Just does not like knowing.  She has not had chest pain, SOB, palpitations and leg swelling. She is watching salt.  She is not exersizing  and states just feels sluggish.She is down 7 pounds since last check though and states portions smaller as not that hungry.  She started Lexapro. She has a hx of anxiety and depression. States has helped and thinks higher dose may help more. She is sleeping so-so. She states uses Lexapro at night and states has always had trouble being up at night. Denies napping. She is less irritable. Her concentration is better. Energy is still low. She denies suicidal ideations. She states major stressor is not working and she is looking for job.  States finances tight as well. She adds kids are stressor too but since she is on Rx much more patient.   She now presents.  Hypertension History:      Positive major cardiovascular risk factors include hypertension.  Negative major cardiovascular risk factors include female age less than 19 years old, no history of diabetes, negative family history for ischemic heart disease, and non-tobacco-user status.        Positive history for target organ damage include cardiac end organ damage (either CHF or LVH).  Further assessment for target organ damage reveals no history of ASHD, stroke/TIA, or peripheral vascular disease.       Prior Medications Reviewed Using: Patient  Recall  Updated Prior Medication List: AMLODIPINE BESYLATE 5 MG TABS (AMLODIPINE BESYLATE) One daily LEXAPRO 10 MG TABS (ESCITALOPRAM OXALATE) One daily  Current Allergies (reviewed today): ! 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)       VENTRICULAR HYPERTROPHY, LEFT (ICD-429.3)       CYSTITIS, ACUTE (ICD-595.0)       COSTOCHONDRITIS, RECURRENT (ICD-733.6)       DENTAL CARIES (ICD-521.00)       OBESITY NOS (ICD-278.00)       ESSENTIAL HYPERTENSION (ICD-401.9)       HEMORRHOIDS, INTERNAL (ICD-455.0)       MIGRAINE HEADACHE (ICD-346.90)       CONSTIPATION NOS (ICD-564.00)       GERD (ICD-530.81)         Past Surgical History:    Reviewed history from 02/12/2008 and no changes required:       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 Lovell Sheehan  Hysterectomy - June 2009 - no cancer cells       Epidurals low back - 2008 x 2   Family History:    Reviewed history from 05/08/2006 and no changes required:       Father: Dead 44 Lung Cancer       Mother: Dead 46 cervical cancer       Siblings: 3 Sisters: DM, 3 Brothers  W&L       Kids: [id number]  Social History:    Reviewed history from 05/08/2006 and no changes required:       Occupation: Psychologist, forensic       Married       Never Smoked       Alcohol use-no       Drug use-no   Risk Factors: Tobacco use:  never Drug use:  no Alcohol use:  no  Family History Risk Factors:    Family History of MI in females < 52 years old:  no    Family History of MI in males < 55 years old:  no  Colonoscopy History:    Date of Last Colonoscopy:  10/21/2005  PAP Smear History:    Date of Last PAP Smear:  10/21/2005   Review of Systems      See HPI  General      Denies chills, fever, and sweats.  Resp      Denies cough and  shortness of breath.  GI      Denies abdominal pain, constipation, diarrhea, nausea, and vomiting.  GU      Denies nocturia, urinary frequency, and urinary hesitancy.   Physical Exam  General:     Obese, in no acute distress. Alert and oriented X 3. Pleasant and talkative Lungs:     Normal respiratory effort, chest expands symmetrically. Lungs are clear to auscultation, no crackles or wheezes. Heart:     Normal rate and regular rhythm. S1 and S2 normal without gallop, murmur, click, rub or other extra sounds. Abdomen:     Bowel sounds positive,abdomen soft and non-tender without masses, organomegaly or hernias noted. Extremities:     No clubbing, cyanosis, edema, or deformity noted with normal full range of motion of all joints.   Cervical Nodes:     No lymphadenopathy noted Psych:     Cognition and judgment appear intact. Alert and cooperative with normal attention span and concentration. No apparent delusions, illusions, hallucinations    Impression & Recommendations:  Problem # 1:  ESSENTIAL HYPERTENSION, BENIGN (ICD-401.1) Add ACe as not controlled. Urged to log outpatient readings. Limit salt, exersize and lose weight. Her updated medication list for this problem includes:    Amlodipine Besylate 5 Mg Tabs (Amlodipine besylate) ..... One daily    Lisinopril 20 Mg Tabs (Lisinopril) ..... One daily   Problem # 2:  PROTEINURIA (ICD-791.0) See above.  Problem # 3:  OBESITY NOS (ICD-278.00) TLC diet with portion control and 30 minutes of daily exersize advised.   Problem # 4:  ANXIETY DEPRESSION (ICD-300.4) Lexapro helping. Cost concern. Switch to generic Celexa. Use daily. Recheck 6 weeks. Cont job Engineer, structural. Incrase exersizeto aide in stress coping and educated how this improves overall health. Aware and agreeable.   Problem # 5:  Preventive Health Care (ICD-V70.0) Review female care on return.  Complete Medication List: 1)  Amlodipine Besylate  5 Mg Tabs (Amlodipine besylate) .... One daily 2)  Celexa 20 Mg Tabs (Citalopram hydrobromide) .... One daily 3)  Lisinopril 20 Mg Tabs (Lisinopril) .... One daily  Hypertension Assessment/Plan:      The patient's hypertensive risk group is category C: Target organ damage and/or diabetes.  Her calculated 10 year risk of coronary heart disease is 1 %.  Today's blood pressure is 150/91.  Her blood pressure goal is < 140/90.   Patient Instructions: 1)  Please schedule a follow-up appointment in 6 weeks.   Prescriptions: LISINOPRIL 20 MG TABS (LISINOPRIL) One daily  #30 x 3   Entered and Authorized by:   Franchot Heidelberg MD   Signed by:   Franchot Heidelberg MD on 05/16/2008   Method used:   Print then Give to Patient   RxID:   [id number] CELEXA 20 MG TABS (CITALOPRAM HYDROBROMIDE) One daily  #30 x 3   Entered and Authorized by:   Franchot Heidelberg MD   Signed by:   Franchot Heidelberg MD on 05/16/2008   Method used:   Print then Give to Patient   RxID:   [id number]

## 2010-04-10 NOTE — Progress Notes (Signed)
Summary: ? about HTN Rx  Phone Note Call from Patient   Summary of Call: patient called in states she has a question about her medication, when I asked which one she said pretty much all of them.  then she said her lisinopril and amlodipine.  please advise Initial call taken by: Curtis Sites,  June 02, 2008 8:24 AM  Follow-up for Phone Call        Pt says that her BP was 95/47 yesterday at the ER and she is still having chest pains. She says that they told her that her blood work made them have concerns of a blood clot. They did test but did not find this. She is concerned about staying on both BP pills.  Follow-up by: Sherilyn Banker LPN,  June 02, 2008 11:12 AM  Additional Follow-up for Phone Call Additional follow up Details #1::        DC Lisinopril and cont Amlodipine. See me in 2 weeks. Await cardiology eval. Additional Follow-up by: Franchot Heidelberg MD,  June 02, 2008 11:18 AM    Additional Follow-up for Phone Call Additional follow up Details #2::    Pt notifed and agrees. Follow-up by: Sherilyn Banker LPN,  June 02, 2008 11:34 AM

## 2010-04-10 NOTE — Miscellaneous (Signed)
Summary: Orders Update  Clinical Lists Changes  Orders: Added new Test order of Ultrasound (Ultrasound) - Signed  Appended Document: Orders Update ABD [place] order faxed to [facility]

## 2010-04-10 NOTE — Assessment & Plan Note (Signed)
Summary: FOLLOW UP 1 MONTH--patient coming in at 10:30/slj   Vital Signs:  Patient Profile:   30 Years Old Female Height:     66 inches (167.64 cm) Weight:      251 pounds BMI:     40.66 O2 Sat:      99 % Pulse rate:   89 / minute Resp:     14 per minute BP sitting:   149 / 97  Vitals Entered By: Sherilyn Banker LPN (April 18, 2008 10:19 AM)                 PCP:  Franchot Heidelberg, MD  Chief Complaint:  BP recheck.  History of Present Illness: Pt in for recheck.  She is in with spouse.   She was diagnosed with depression. She stopped her Rx i.e. Cymbalta after 2 days as it made her nausea. She states she is feeling better and adds  having kids in school helps. She states has a lot of stress at home in ill spouse and has to care for several kids, other family members. She is sleeping so-so. She states irritable. Concentration better. She states she has not had suicidal ideations and states hats taking medications. She notes she is willing to try something different.  She has HTN. She has mild protein in urine. She started Amlodipine and has tolerated well. States makes her hands and legs feel a little puffy. She is watching salt. She is walking some for exersize. She denies chest pain, SOB, orthopnea, PND and palpitations. She denies leg swelling.  She denies headache, tremors and weakness. States had normal head CT last year.  Echart rviewed and confirms this in Spet 2009.  She now presents.   Hypertension History:      She denies headache, chest pain, palpitations, dyspnea with exertion, orthopnea, PND, peripheral edema, visual symptoms, neurologic problems, syncope, and side effects from treatment.  She notes no problems with any antihypertensive medication side effects.  Further comments include: See HPI.        Positive major cardiovascular risk factors include hypertension.  Negative major cardiovascular risk factors include female age less than 68 years old, no history  of diabetes, negative family history for ischemic heart disease, and non-tobacco-user status.        Positive history for target organ damage include cardiac end organ damage (either CHF or LVH).  Further assessment for target organ damage reveals no history of ASHD, stroke/TIA, or peripheral vascular disease.       Prior Medications Reviewed Using: Patient Recall  Updated Prior Medication List: METOCLOPRAMIDE HCL 10 MG TABS (METOCLOPRAMIDE HCL) One every 6 hours as needed AMLODIPINE BESYLATE 5 MG TABS (AMLODIPINE BESYLATE) One daily  Current Allergies (reviewed today): ! 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)       VENTRICULAR HYPERTROPHY, LEFT (ICD-429.3)       CYSTITIS, ACUTE (ICD-595.0)       COSTOCHONDRITIS, RECURRENT (ICD-733.6)       DENTAL CARIES (ICD-521.00)       OBESITY NOS (ICD-278.00)       ESSENTIAL HYPERTENSION (ICD-401.9)       HEMORRHOIDS, INTERNAL (ICD-455.0)       MIGRAINE HEADACHE (ICD-346.90)       CONSTIPATION NOS (ICD-564.00)       GERD (ICD-530.81)         Past Surgical  History:    Reviewed history from 02/12/2008 and no changes required:       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 Lovell Sheehan       Hysterectomy - June 2009 - no cancer cells       Epidurals low back - 2008 x 2   Family History:    Reviewed history from 05/08/2006 and no changes required:       Father: Dead 10 Lung Cancer       Mother: Dead 24-Aug-2022 cervical cancer       Siblings: 3 Sisters: DM, 3 Brothers  W&L       Kids: [id number]  Social History:    Reviewed history from 05/08/2006 and no changes required:       Occupation: Psychologist, forensic       Married       Never Smoked       Alcohol use-no       Drug use-no   Risk Factors: Tobacco use:  never Drug use:  no Alcohol use:   no  Family History Risk Factors:    Family History of MI in females < 2 years old:  no    Family History of MI in males < 87 years old:  no  Colonoscopy History:    Date of Last Colonoscopy:  10/21/2005  PAP Smear History:    Date of Last PAP Smear:  10/21/2005   Review of Systems      See HPI  General      Denies chills, fever, and sweats.  Resp      Denies cough, shortness of breath, sputum productive, and wheezing.  GI      Denies abdominal pain, constipation, diarrhea, nausea, and vomiting.  GU      Denies nocturia, urinary frequency, and urinary hesitancy.   Physical Exam  General:     Obese, in no acute distress. Alert and oriented X 3. Pleasant and talkative Lungs:     Normal respiratory effort, chest expands symmetrically. Lungs are clear to auscultation, no crackles or wheezes. Heart:     Normal rate and regular rhythm. S1 and S2 normal without gallop, murmur, click, rub or other extra sounds. Abdomen:     Bowel sounds positive,abdomen soft and non-tender without masses, organomegaly or hernias noted. Extremities:     No clubbing, cyanosis, edema, or deformity noted with normal full range of motion of all joints.   Cervical Nodes:     No lymphadenopathy noted Psych:     Cognition and judgment appear intact. Alert and cooperative with normal attention span and concentration. No apparent delusions, illusions, hallucinations    Impression & Recommendations:  Problem # 1:  ANXIETY DEPRESSION (ICD-300.4) She is better today but has not used Cymbalta due to nausea. Advised common side-effect on Rx and to try and stick with this. She is not open to Cymbalta and would like to try something else. Gave 4 weeks of Lexapro samples. Advised risk and benefit as well as method of use. Councelled time for self with busy family and encouraged stress coping skills. Update if any concerns on Rx. Recheck 4 weeks otherwise. Agrees.   Problem # 2:  CHEST PAIN  (ICD-786.50) Resolved. ? anxiety related.  Problem # 3:  ESSENTIAL HYPERTENSION, BENIGN (ICD-401.1) Improved. Rx as is. Note protein  on UA. If BP remians hih in 4 weeks, add ACE. Limit salt, lose weight. Her updated medication list for this problem includes:    Amlodipine Besylate 5 Mg Tabs (Amlodipine besylate) ..... One daily  Orders: UA Dipstick w/o Micro (manual) (54098)   Problem # 4:  OBESITY NOS (ICD-278.00) Again councelled TLC and how exersize can benefit emotional well-being.  Complete Medication List: 1)  Metoclopramide Hcl 10 Mg Tabs (Metoclopramide hcl) .... One every 6 hours as needed 2)  Amlodipine Besylate 5 Mg Tabs (Amlodipine besylate) .... One daily  Hypertension Assessment/Plan:      The patient's hypertensive risk group is category C: Target organ damage and/or diabetes.  Her calculated 10 year risk of coronary heart disease is 1 %.  Today's blood pressure is 149/97.  Her blood pressure goal is < 140/90.   Patient Instructions: 1)  Please schedule a follow-up appointment in 1 month.    Laboratory Results   Urine Tests    Routine Urinalysis   Color: yellow Appearance: Clear Glucose: negative   (Normal Range: Negative) Bilirubin: negative   (Normal Range: Negative) Ketone: negative   (Normal Range: Negative) Spec. Gravity: 1.020   (Normal Range: 1.003-1.035) Blood: negative   (Normal Range: Negative) pH: 6.5   (Normal Range: 5.0-8.0) Protein: 30   (Normal Range: Negative) Urobilinogen: 0.2   (Normal Range: 0-1) Nitrite: negative   (Normal Range: Negative) Leukocyte Esterace: negative   (Normal Range: Negative)

## 2010-04-10 NOTE — Progress Notes (Signed)
Summary: Need to talk to the nurse  Phone Note Call from Patient   Caller: Patient Call For: Al Summary of Call: Patient would like to talk with the nurse.  States she is sick but didn't wont to schedule an appt.  Please call [phone]. Initial call taken by: Alden Server,  December 17, 2006 4:04 PM  Follow-up for Phone Call        called, no answer. message left for patient to return call .................................................................Marland KitchenMarland KitchenSonny Dandy  December 18, 2006 8:23 AM   Patient returned call, c/o cough, sore throat, feels hot and body aches. .................................................................Marland KitchenMarland KitchenSonny Dandy  December 18, 2006 8:44 AM  Follow-up by: Sonny Dandy,  December 18, 2006 8:44 AM  Additional Follow-up for Phone Call Additional follow up Details #1::        Per Dr Erby Pian, [facility] called to [facility] and advised patient to use chloraseptic spray    Additional Follow-up for Phone Call Additional follow up Details #2::    Chloraseptic spray as directed [employer] as directed Update if worse. Follow-up by: Franchot Heidelberg MD,  December 18, 2006 8:42 AM

## 2010-04-10 NOTE — Miscellaneous (Signed)
Summary: Orders Update - GI  Clinical Lists Changes  Orders: Added new Referral order of Gastroenterology Referral (GI) - Signed 
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
## 2010-04-10 NOTE — Progress Notes (Signed)
Summary: REQUESTING REFILL ON KLONOPIN  Phone Note Call from Patient Call back at Home Phone [phone]   Caller: Patient Call For: Franchot Heidelberg MD Summary of Call: patient saw counselor already at [facility] and doesn't see psychiatrist until June and was advised by counselor to ask Primary MD for one more refill on klonopin until she's able to see doctor there. uses [facility].  Initial call taken by: Harlene Salts,  Jul 20, 2008 10:53 AM  Follow-up for Phone Call        Agree with one refill. Follow-up by: Franchot Heidelberg MD,  Jul 20, 2008 11:08 AM      Prescriptions: Brenda Richardson 0.5 MG TABS (CLONAZEPAM) Two at  night  #60 x 0   Entered and Authorized by:   Franchot Heidelberg MD   Signed by:   Franchot Heidelberg MD on 07/20/2008   Method used:   Printed then faxed to ...       [address] [phone]* (retail)       [address]       [place], [place]  [address]       Ph: [phone] or [phone]       Fax: [phone]   RxID:   [id number]   Appended Document: REQUESTING REFILL ON [facility] patient informed.LM

## 2010-04-10 NOTE — Letter (Signed)
Summary: NUTRITIONAL REFERRAL  NUTRITIONAL REFERRAL   Imported By: Ave Filter 12/11/2009 14:37:36  _____________________________________________________________________  External Attachment:    Type:   Image     Comment:   External Document

## 2010-04-10 NOTE — Letter (Signed)
Summary: [facility]  [facility] P   Imported By: Micah Flesher, LPN 16/12/9602 54:09:81  _____________________________________________________________________  External Attachment:    Type:   Image     Comment:   External Document

## 2010-04-10 NOTE — Letter (Signed)
Summary: LABS FROM [facility]  LABS FROM [facility]   Imported By: Rexene Alberts 12/12/2009 14:35:15  _____________________________________________________________________  External Attachment:    Type:   Image     Comment:   External Document

## 2010-04-10 NOTE — Letter (Signed)
Summary: [facility]cs/egd/ed order  [facility]/ed order   Imported By: Ave Filter 12/12/2009 14:26:03  _____________________________________________________________________  External Attachment:    Type:   Image     Comment:   External Document  Appended Document: [facility]cs/egd/ed order Insurance [phone]

## 2010-04-10 NOTE — Assessment & Plan Note (Signed)
Summary: Left hip pain   Vital Signs:  Patient Profile:   30 Years Old Female Height:     66 inches (167.64 cm) Weight:      238 pounds Pulse rate:   79 / minute Resp:     14 per minute BP sitting:   141 / 97  (left arm)  Pt. in pain?   yes    Location:   hip    Intensity:   8    Type:       sharp  Vitals Entered BySonny Dandy (August 20, 2006 9:00 AM)                PCP:  Franchot Heidelberg, MD  Chief Complaint:  left hip/leg pain & numbness x 24hours.  History of Present Illness: Pt states in for acute visit.  She states she woke up yesterday and had left hip pain. States pain is constant and sharp. Goes from hip to left thigh. Worse with walking. Better with rest. States she slept lst night and it did fine. Once she started moving pain came back. She denies back pain. No ankle pain but she has hx of avulsion fracture. She denies falls  and injuries. Was fine Monday night and sx just happened Tuesday. Even had husband check for insect bites. None found.  No abdominal pain. No nausea or vomitting. No diarrhea or constipation. No blood in stool.   She has had no urinary sx including burning, stinging and bad smells.  She denies fever and sweats but was chilled yesterday.  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  Now presents.    Hypertension History:      She denies headache, chest pain, palpitations, dyspnea with exertion, orthopnea, PND, peripheral edema, visual symptoms, neurologic problems, syncope, and side effects from treatment.  She notes no problems with any antihypertensive medication side effects.        Positive major cardiovascular risk factors include hypertension.  Negative major cardiovascular risk factors include female age less than 49 years old and non-tobacco-user status.     Current Allergies (reviewed today): ! 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 Lovell Sheehan   Family History:    Reviewed history from 05/08/2006 and no changes required:       Father: Dead 39 Lung Cancer       Mother: Dead 08/15/22 cervical cancer       Siblings: 3 Sisters: DM, 3 Brothers  W&L       Kids: [id number]  Social History:    Reviewed history from 05/08/2006 and no changes required:       Occupation: Psychologist, forensic       Married       Never Smoked       Alcohol use-no       Drug use-no    Review of Systems      See HPI   Physical Exam  General:     Well-developed,well-nourished,in no acute distress;  alert,appropriate and cooperative throughout examination. Obese. Neck:     No deformities, masses, or tenderness noted. Lungs:     Normal respiratory effort, chest expands symmetrically. Lungs are clear to auscultation, no crackles or wheezes. Heart:     Normal rate and regular rhythm. S1 and S2 normal without gallop, murmur, click, rub or other extra sounds. Abdomen:     Bowel sounds positive,abdomen soft and non-tender without masses, organomegaly or hernias noted. Extremities:     Trace edema left leg. Note normal back exam and SLT. However, patient complaisn about severe pain in left hip on rotation, abduction and external rotation. Minimal pain on internal rotation. No redness over joint. Strength 5/5. Gait normal.    Impression & Recommendations:  Problem # 1:  HIP PAIN, LEFT (ICD-719.45) Discussed. Acute onset. Check xray, sed rate and cbc. Start high dose NSAID and recheck 72 hours, sooner if needed. If sx worsen, go to ED. Diff includes burisitus, infectious cause, AVN etc. Work excuse for today. Councelled risk and beneift of NSAID use and to DC if any side effects  with update here. Her updated medication list for this problem includes:    Ultram Er 100 Mg Tb24 (Tramadol hcl) ..... Once daily    Motrin 800 Mg Tabs (Ibuprofen) ..... One three times a day for five days  Orders: T-Hip x-ray, unilateral complete 2 views (40102) T-CBC w/Diff ([id number])   Medications Added to Medication List This Visit: 1)  Motrin 800 Mg Tabs (Ibuprofen) .... One three times a day for five days  Other Orders: T- * Misc. Laboratory test [phone])  Hypertension Assessment/Plan:      The patient's hypertensive risk group is category A: No risk factors and no target organ damage.  Today's blood pressure is 141/97.  Her blood pressure goal is < 140/90.   Patient Instructions: 1)  Please schedule a follow-up appointment in 72 hours  - sooner if needed.

## 2010-04-10 NOTE — Miscellaneous (Signed)
Summary: Orders Update ortho referral  Clinical Lists Changes  Orders: Added new Referral order of Orthopedic Referral (Ortho) - Signed

## 2010-04-10 NOTE — Progress Notes (Signed)
Summary: please return call  Phone Note Call from Patient   Reason for Call: Talk to Nurse Summary of Call: patient has a question and wants al or kim to call her back...  please advise Initial call taken by: Donneta Romberg,  September 23, 2006 11:59 AM  Follow-up for Phone Call        called, message left for patient to return call .................................................................Marland KitchenMarland KitchenBritt Bottom Long  September 23, 2006 12:05 PM canceled appointment at [facility], wants to go to [place] instead.   I will set this up Follow-up by: Sonny Dandy,  September 23, 2006 4:47 PM

## 2010-04-10 NOTE — Miscellaneous (Signed)
Summary: Orders Update  Clinical Lists Changes  Orders: Added new Test order of Ultrasound (Ultrasound) - Signed

## 2010-04-10 NOTE — Progress Notes (Signed)
Summary: ortho/US referral  Phone Note Outgoing Call   Call placed by: Sonny Dandy,  September 19, 2006 2:10 PM Summary of Call: call to patient, appointment set for [place] 09/23/06 at 800am, told NPO [place]. [place] [facility] [place] for [place]. voices understanding  Initial call taken by: Sonny Dandy,  September 19, 2006 2:12 PM

## 2010-04-10 NOTE — Progress Notes (Signed)
Summary: GU follow up   Phone Note Call from Patient   Caller: Patient Summary of Call: c/o increased frequency, denies urgency or dysuria.  Initial call taken by: Sonny Dandy,  April 27, 2007 9:42 AM  Follow-up for Phone Call        Needs appt. Follow-up by: Franchot Heidelberg MD,  April 27, 2007 9:48 AM  Additional Follow-up for Phone Call Additional follow up Details #1::        schedule Ms Lapine for this afternoon please Additional Follow-up by: Sonny Dandy,  April 27, 2007 9:49 AM    Additional Follow-up for Phone Call Additional follow up Details #2::    tried calling x2 Follow-up by: Donneta Romberg,  April 27, 2007 10:05 AM  Additional Follow-up for Phone Call Additional follow up Details #3:: Details for Additional Follow-up Action Taken: scheduled appt for tomorrow at 1030.Marland KitchenMarland Kitchenpatient aware Additional Follow-up by: Donneta Romberg,  April 27, 2007 11:09 AM

## 2010-04-10 NOTE — Letter (Signed)
Summary: [facility] notes  [facility] notes   Imported By: Curtis Sites 05/13/2007 10:08:02  _____________________________________________________________________  External Attachment:    Type:   Image     Comment:   External Document

## 2010-04-12 NOTE — Assessment & Plan Note (Signed)
Summary: ov with extender in 6 weeks and CBC prior to appt/ss   Visit Type:  Follow-up Visit Primary Care Provider:  [facility]  Chief Complaint:  follow up- still having some problems.  History of Present Illness: History of dysphagia, morbid obesity and  colonic adenoma on recent colonoscopy. Status post H. pylori treatment recently. Still has some dysphagia. She had a normal tubular esophagus on recent EGD. I passed a 51 [place] Maloney dilator anyway. She states has helped about a month; she denies any reflux symptoms and is not on acid suppression therapy at this time. She has a BMI of 44. She wants me to be referred to a bariatric surgeon. Her PCP is in  [place]. She tells me that she does not want to have surgery in [place] - she wants to go to [place]. She had been significantly overwieght most of her life and really has failed to accomplish any significant weight loss in spite of her best effort.  Current Problems (verified): 1)  Hematochezia  (ICD-578.1) 2)  Anemia-unspecified  (ICD-285.9) 3)  Dysphagia  (ICD-787.29) 4)  Abdominal Pain, Generalized  (ICD-789.07) 5)  Diverticulitis, Colon  (ICD-562.11) 6)  Epigastric Pain  (ICD-789.06) 7)  Nausea and Vomiting  (ICD-787.01) 8)  Pelvic Pain  (ICD-789.09) 9)  Abdominal Pain  (ICD-789.00) 10)  Liver Lesions  () 11)  Hyperglycemia  (ICD-790.29) 12)  Bipolar Affective Disorder  (ICD-296.80) 13)  Proteinuria  (ICD-791.0) 14)  Anxiety Depression  (ICD-300.4) 15)  Chest Pain  (ICD-786.50) 16)  Essential Hypertension, Benign  (ICD-401.1) 17)  Low Back Pain, Mild  (ICD-724.2) 18)  Ventricular Hypertrophy, Left  (ICD-429.3) 19)  Costochondritis, Recurrent  (ICD-733.6) 20)  Obesity Nos  (ICD-278.00) 21)  Hemorrhoids, Internal  (ICD-455.0) 22)  Migraine Headache  (ICD-346.90) 23)  Constipation Nos  (ICD-564.00) 24)  Gerd  (ICD-530.81)  Current Medications (verified): 1)  Klonopin 1 Mg Tabs (Clonazepam) .... 2 Tabs  Daily 2)  Verapamil Hcl 120 Mg Tabs (Verapamil Hcl) .... Take 1 Tab Daily 3)  Effexor Xr 75 Mg Xr24h-Cap (Venlafaxine Hcl) .... Take 1 Tab Two Times A Day 4)  Gabapentin 100 Mg Caps (Gabapentin) .... Take 1 Tab Daily 5)  Remeron 15 Mg Tabs (Mirtazapine) .... Take 1 Tab At Bedtime  Allergies (verified): 1)  ! Vicodin 2)  ! Nubain (Nalbuphine Hcl) 3)  ! Toradol 4)  ! 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 Never Smoked Alcohol use-no Drug use-no Lives with spouse and kids Education - 10th grade  Risk Factors: Alcohol Use: 0 (07/13/2008)  Risk Factors: Smoking Status: never (07/13/2008)  Vital Signs:  Patient profile:   30 year old female Height:      66 inches Weight:      275 pounds BMI:     44.55 Temp:     97.8 degrees F oral Pulse rate:   68 / minute BP sitting:   118 / 88  (left arm) Cuff size:    large  Vitals Entered By: Hendricks Limes LPN (February 21, 2010 9:39 AM)  Physical Exam  General:  pleasant 30 year old lady resting comfortably Eyes:  no scleral icterus Lungs:  clear to auscultation Heart:  regular rate and rhythm without murmur gallop or rub Abdomen:  massively obese. Positive bowel sounds soft, nontender without appreciable mass or organomegaly  Impression & Recommendations: Impression: ngoing dysphagia in spite of negative EGD and empiric passage of a Maloney dilator. History of colonic adenoma; due for surveillance examination 2016. History of H. pylori infection status post recent treatment.  Patient is desirous of referral for bariatric surgery evaluation. I had a lenghty discussion with her in the office today about this approach. I feel this is actually not a bad idea. I told her there was a significant process she would undergo prior to actually having surgery and a bariatric surgeons would need to deem her an appropriate candidate.  At her request, I will take the liberty of getting her an appointment to see one of the bariatric surgeon's  at [facility] in [place] preference.  Recurrent  dysphagia is concerned ;we'll go ahead and obtain a barium pill esophagram and review that study when it becomes available.  Appended Document: Orders Update    Clinical Lists Changes  Orders: Added new Service order of Est. Patient Level IV (45409) - Signed      Appended Document: ov with extender in 6 weeks and CBC prior to appt/ss BPE normal; needs to be on a PPI - Protonix 40 mg daily- disp 30 - 1 year refill  Appended Document: ov with extender in 6 weeks and CBC prior to appt/ss pt aware, she stated she already has some protonix at home and will start taking it and will call us if she needs some called to pharmacy.

## 2010-04-12 NOTE — Letter (Signed)
Summary: SURGIACAL REFERRAL  SURGIACAL REFERRAL   Imported By: Ave Filter 02/21/2010 10:24:23  _____________________________________________________________________  External Attachment:    Type:   Image     Comment:   External Document

## 2010-04-12 NOTE — Miscellaneous (Signed)
Summary: Orders Update  Clinical Lists Changes  Orders: Added new Test order of T-CBC w/Diff ([id number]) - Signed 

## 2010-04-12 NOTE — Assessment & Plan Note (Signed)
Summary: F1Y   Visit Type:  Follow-up Referring Provider:  Truddie Hidden Primary Provider:  Maralyn Sago  CC:  some chest pain.  History of Present Illness: Ms Brenda Richardson comes in today for followup and evaluation for chest discomfort.  I evaluated her in March of 2010. Please see that note. At that time she has sharp stabbing pain in the center of her chest. She had been evaluated with negative cardiac markers and emergency department, a normal EKG, and a CT scan which showed no pulmonary embolus.  She now has had an aching that comes and goes over her left breast. It occurs both at rest and sometimes with activity. There are no other associated symptoms. It does not radiate.  She was told by her primary care physician in [place] her pressure was low. Her lisinopril was discontinued. She is on verapamil 120mg  a day for headaches.  When I saw her in March of 2010 we performed echocardiogram which was normal.  She is very heavy but says she doesn't eat very much. She is not a diabetic yet. Her lipids are remarkably good. She does not smoke.  Current Medications (verified): 1)  Klonopin 1 Mg Tabs (Clonazepam) .... 2 Tabs Daily 2)  Verapamil Hcl 120 Mg Tabs (Verapamil Hcl) .... Take 1 Tab Daily 3)  Effexor Xr 75 Mg Xr24h-Cap (Venlafaxine Hcl) .... Take 1 Tab Two Times A Day 4)  Gabapentin 100 Mg Caps (Gabapentin) .... Take 1 Tab Daily 5)  Remeron 15 Mg Tabs (Mirtazapine) .... Take 1 Tab At Bedtime  Allergies (verified): 1)  ! Vicodin 2)  ! Nubain (Nalbuphine Hcl) 3)  ! Toradol 4)  ! 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  Vital Signs:  Patient profile:   30 year old female Weight:      272 pounds BMI:     44.06 O2 Sat:      97 % on Room air Pulse rate:   89 / minute BP sitting:   153 / 99  (right arm)  Vitals Entered By: Dreama Saa, CNA (February 19, 2010 11:20 AM)  O2 Flow:  Room air  Physical Exam  General:  pleasant, in no acute distress, morbidly obese Head:  normocephalic and atraumatic Eyes:  PERRLA/EOM intact; conjunctiva and lids normal. Neck:  Neck supple, no JVD. No masses, thyromegaly or abnormal cervical nodes. Chest Treyshon Buchanon:  no deformities or breast masses noted Lungs:  clear to auscultation percussion Heart:  PMI difficult to appreciate, soft S1-S2, no murmur rub or gallop. Carotids are full Msk:  Back normal, normal gait. Muscle strength and tone normal. Pulses:  pulses normal  in all 4 extremities Extremities:  No clubbing or cyanosis. Neurologic:  Alert and oriented x 3. Skin:  Intact without lesions or rashes. Psych:  Normal affect.   EKG  Procedure date:  02/19/2010  Findings:      normal sinus rhythm, T-wave inversion in V1 through V3, no significant change from March of 2010.  Impression & Recommendations:  Problem # 1:  CHEST PAIN (ICD-786.50) this is noncardiac. I explained to her at this at length. She does have significant risk factors particularly her weight for developing heart and vascular problems down the road. I strongly advised her  to lose weight and become more active. She wasn't very enthusiastic about trying to do this. I will see her back p.r.n. Her updated medication list for this problem includes:    Verapamil Hcl 120 Mg Tabs (Verapamil hcl) .Marland Kitchen... Take 1 tab daily  Problem # 2:  ESSENTIAL HYPERTENSION, BENIGN (ICD-401.1) Assessment: Improved  Her updated medication list for this problem includes:    Verapamil Hcl 120 Mg Tabs (Verapamil hcl) .Marland Kitchen... Take 1 tab daily  Problem # 3:  OBESITY NOS (ICD-278.00) See above comments and recomendations.

## 2010-04-12 NOTE — Progress Notes (Signed)
Summary: phone note/ difficulty swallowing and questions  Phone Note Call from Patient   Caller: Patient Summary of Call: Pt called and wanted to know what would the next step be after an EGD. Said she had one end of October this year, and she is having difficulty swallowing liquids and solids. She also had questions in reference to gastric by-pass. She said she has appt with Dr. Jena Gauss on Wed this week, and I told her that would be a good time to ask questions and let him know all of her concerns.  Initial call taken by: Cloria Spring LPN,  February 19, 2010 2:18 PM

## 2010-04-12 NOTE — Letter (Signed)
Summary: BPE ORDER  BPE ORDER   Imported By: Ave Filter 02/21/2010 10:22:04  _____________________________________________________________________  External Attachment:    Type:   Image     Comment:   External Document

## 2010-04-14 NOTE — Discharge Summary (Signed)
  [name], Brenda Richardson              ACCOUNT NO.:  [id number]  MEDICAL RECORD NO.:  [id number]          PATIENT TYPE:  INP  LOCATION:  A332                          FACILITY:  [facility]  PHYSICIAN:  Pleas Koch, MD        DATE OF BIRTH:  August 26, 1980  DATE OF ADMISSION:  03/29/2010 DATE OF DISCHARGE:  01/25/2012LH                              DISCHARGE SUMMARY   ADDENDUM TO DISCHARGE SUMMARY:  The patient had mammogram done, and it was noted that mammogram was negative for abscess.  The patient will continue on Augmentin as noted prior.  The patient will be reviewed by outpatient physician, Dr. Rozanna Box.                                           ______________________________ Pleas Koch, MD     JS/MEDQ  D:  04/04/2010  T:  04/04/2010  Job:  [id number]  cc:   Dr. Rozanna Box  Electronically Signed by Pleas Koch MD on 04/14/2010 06:41:24 PM

## 2010-05-10 ENCOUNTER — Encounter (INDEPENDENT_AMBULATORY_CARE_PROVIDER_SITE_OTHER): Payer: Self-pay | Admitting: *Deleted

## 2010-05-16 ENCOUNTER — Ambulatory Visit (HOSPITAL_BASED_OUTPATIENT_CLINIC_OR_DEPARTMENT_OTHER): Admission: RE | Admit: 2010-05-16 | Payer: Managed Care, Other (non HMO) | Source: Ambulatory Visit | Admitting: Surgery

## 2010-05-17 NOTE — Letter (Signed)
Summary: Recall, Labs Needed  [facility]  [address]   [place], [place] [address]   Phone: [phone]  Fax: [phone]    May 10, 2010  Brenda Richardson [address] [place], [place]  [address] 1980/07/15   Dear Ms. Blanchfield,   Our records indicate it is time to repeat your blood work.  You can take the enclosed form to the lab on or near the date indicated.  Please make note of the new location of the lab:   [address], [address]nd floor   [facility]  Our office will call you within a week to ten business days with the results.  If you do not hear from [place] in 10 business days, you should call the office.  If you have any questions regarding this, call the office at [phone], and ask for the nurse.  Labs are due on 21 May 2010  Sincerely,    Carolan Clines LPN  [facility] Ph: [phone]   Fax: [phone]

## 2010-05-21 LAB — BASIC METABOLIC PANEL
BUN: 7 mg/dL (ref 6–23)
CO2: 28 mEq/L (ref 19–32)
Calcium: 9.4 mg/dL (ref 8.4–10.5)
Chloride: 103 mEq/L (ref 96–112)
Creatinine, Ser: 0.67 mg/dL (ref 0.4–1.2)
GFR calc Af Amer: >60 mL/min (ref >60)
GFR calc non Af Amer: >60 mL/min (ref >60)
Glucose, Bld: 98 mg/dL (ref 70–99)
Potassium: 3.6 mEq/L (ref 3.5–5.1)
Sodium: 140 mEq/L (ref 135–145)

## 2010-05-21 LAB — CBC
HCT: 36.9 % (ref 36.0–46.0)
Hemoglobin: 12.2 g/dL (ref 12.0–15.0)
MCH: 25.6 pg — ABNORMAL LOW (ref 26.0–34.0)
MCHC: 33.1 g/dL (ref 30.0–36.0)
MCV: 77.5 fL — ABNORMAL LOW (ref 78.0–100.0)
Platelets: 391 10*3/uL (ref 150–400)
RBC: 4.76 MIL/uL (ref 3.87–5.11)
RDW: 15.7 % — ABNORMAL HIGH (ref 11.5–15.5)
WBC: 12 10*3/uL — ABNORMAL HIGH (ref 4.0–10.5)

## 2010-05-21 LAB — GLUCOSE, CAPILLARY: Glucose-Capillary: 97 mg/dL (ref 70–99)

## 2010-05-21 LAB — URINALYSIS, ROUTINE W REFLEX MICROSCOPIC
Bilirubin Urine: NEGATIVE
Glucose, UA: NEGATIVE mg/dL
Hgb urine dipstick: NEGATIVE
Ketones, ur: NEGATIVE mg/dL
Nitrite: NEGATIVE
Protein, ur: NEGATIVE mg/dL
Specific Gravity, Urine: 1.02 (ref 1.005–1.030)
Urobilinogen, UA: 0.2 mg/dL (ref 0.0–1.0)
pH: 7 (ref 5.0–8.0)

## 2010-05-21 LAB — PREGNANCY, URINE: Preg Test, Ur: NEGATIVE

## 2010-05-22 LAB — DIFFERENTIAL
Basophils Absolute: 0 10*3/uL (ref 0.0–0.1)
Basophils Relative: 0 % (ref 0–1)
Eosinophils Absolute: 0.3 10*3/uL (ref 0.0–0.7)
Eosinophils Relative: 3 % (ref 0–5)
Lymphocytes Relative: 23 % (ref 12–46)
Lymphs Abs: 2.4 10*3/uL (ref 0.7–4.0)
Monocytes Absolute: 0.6 10*3/uL (ref 0.1–1.0)
Monocytes Relative: 6 % (ref 3–12)
Neutro Abs: 7.2 10*3/uL (ref 1.7–7.7)
Neutrophils Relative %: 68 % (ref 43–77)

## 2010-05-22 LAB — CBC
HCT: 35.5 % — ABNORMAL LOW (ref 36.0–46.0)
Hemoglobin: 11.5 g/dL — ABNORMAL LOW (ref 12.0–15.0)
MCH: 24.8 pg — ABNORMAL LOW (ref 26.0–34.0)
MCHC: 32.4 g/dL (ref 30.0–36.0)
MCV: 76.4 fL — ABNORMAL LOW (ref 78.0–100.0)
Platelets: 362 10*3/uL (ref 150–400)
RBC: 4.65 MIL/uL (ref 3.87–5.11)
RDW: 15.2 % (ref 11.5–15.5)
WBC: 10.6 10*3/uL — ABNORMAL HIGH (ref 4.0–10.5)

## 2010-05-22 LAB — BASIC METABOLIC PANEL
BUN: 7 mg/dL (ref 6–23)
CO2: 26 mEq/L (ref 19–32)
Calcium: 9.1 mg/dL (ref 8.4–10.5)
Chloride: 104 mEq/L (ref 96–112)
Creatinine, Ser: 0.67 mg/dL (ref 0.4–1.2)
GFR calc Af Amer: >60 mL/min (ref >60)
GFR calc non Af Amer: >60 mL/min (ref >60)
Glucose, Bld: 95 mg/dL (ref 70–99)
Potassium: 3.5 mEq/L (ref 3.5–5.1)
Sodium: 137 mEq/L (ref 135–145)

## 2010-05-22 LAB — RAPID URINE DRUG SCREEN, HOSP PERFORMED
Amphetamines: NOT DETECTED
Barbiturates: NOT DETECTED
Benzodiazepines: NOT DETECTED
Cocaine: NOT DETECTED
Opiates: NOT DETECTED
Tetrahydrocannabinol: NOT DETECTED

## 2010-05-22 LAB — ETHANOL: Alcohol, Ethyl (B): <5 mg/dL (ref 0–10)

## 2010-05-23 LAB — BASIC METABOLIC PANEL
BUN: 11 mg/dL (ref 6–23)
CO2: 27 mEq/L (ref 19–32)
Calcium: 9.3 mg/dL (ref 8.4–10.5)
Chloride: 106 mEq/L (ref 96–112)
Creatinine, Ser: 0.69 mg/dL (ref 0.4–1.2)
GFR calc Af Amer: >60 mL/min (ref >60)
GFR calc non Af Amer: >60 mL/min (ref >60)
Glucose, Bld: 137 mg/dL — ABNORMAL HIGH (ref 70–99)
Potassium: 3.2 mEq/L — ABNORMAL LOW (ref 3.5–5.1)
Sodium: 138 mEq/L (ref 135–145)

## 2010-05-23 LAB — POCT I-STAT 4, (NA,K, GLUC, HGB,HCT)
Glucose, Bld: 114 mg/dL — ABNORMAL HIGH (ref 70–99)
HCT: 38 % (ref 36.0–46.0)
Hemoglobin: 12.9 g/dL (ref 12.0–15.0)
Potassium: 3.2 mEq/L — ABNORMAL LOW (ref 3.5–5.1)
Sodium: 140 mEq/L (ref 135–145)

## 2010-05-23 LAB — HEMOGLOBIN AND HEMATOCRIT, BLOOD
HCT: 32.6 % — ABNORMAL LOW (ref 36.0–46.0)
Hemoglobin: 10.8 g/dL — ABNORMAL LOW (ref 12.0–15.0)

## 2010-05-28 ENCOUNTER — Encounter: Payer: Self-pay | Admitting: Internal Medicine

## 2010-05-28 LAB — COMPREHENSIVE METABOLIC PANEL
ALT: 19 U/L (ref 0–35)
AST: 17 U/L (ref 0–37)
Albumin: 3.1 g/dL — ABNORMAL LOW (ref 3.5–5.2)
Alkaline Phosphatase: 92 U/L (ref 39–117)
BUN: 8 mg/dL (ref 6–23)
CO2: 27 mEq/L (ref 19–32)
Calcium: 9 mg/dL (ref 8.4–10.5)
Chloride: 106 mEq/L (ref 96–112)
Creatinine, Ser: 0.71 mg/dL (ref 0.4–1.2)
GFR calc Af Amer: >60 mL/min (ref >60)
GFR calc non Af Amer: >60 mL/min (ref >60)
Glucose, Bld: 92 mg/dL (ref 70–99)
Potassium: 3.5 mEq/L (ref 3.5–5.1)
Sodium: 139 mEq/L (ref 135–145)
Total Bilirubin: 0.3 mg/dL (ref 0.3–1.2)
Total Protein: 7.5 g/dL (ref 6.0–8.3)

## 2010-05-28 LAB — URINALYSIS, ROUTINE W REFLEX MICROSCOPIC
Bilirubin Urine: NEGATIVE
Glucose, UA: NEGATIVE mg/dL
Ketones, ur: NEGATIVE mg/dL
Leukocytes, UA: NEGATIVE
Nitrite: NEGATIVE
Protein, ur: NEGATIVE mg/dL
Specific Gravity, Urine: 1.02 (ref 1.005–1.030)
Urobilinogen, UA: 0.2 mg/dL (ref 0.0–1.0)
pH: 7 (ref 5.0–8.0)

## 2010-05-28 LAB — RAPID URINE DRUG SCREEN, HOSP PERFORMED
Amphetamines: NOT DETECTED
Barbiturates: NOT DETECTED
Benzodiazepines: POSITIVE — AB
Cocaine: NOT DETECTED
Opiates: NOT DETECTED
Tetrahydrocannabinol: NOT DETECTED

## 2010-05-28 LAB — CBC
HCT: 33.8 % — ABNORMAL LOW (ref 36.0–46.0)
HCT: 35.5 % — ABNORMAL LOW (ref 36.0–46.0)
Hemoglobin: 11.1 g/dL — ABNORMAL LOW (ref 12.0–15.0)
Hemoglobin: 11.6 g/dL — ABNORMAL LOW (ref 12.0–15.0)
MCHC: 32.6 g/dL (ref 30.0–36.0)
MCHC: 32.7 g/dL (ref 30.0–36.0)
MCV: 76.5 fL — ABNORMAL LOW (ref 78.0–100.0)
MCV: 76.7 fL — ABNORMAL LOW (ref 78.0–100.0)
Platelets: 310 10*3/uL (ref 150–400)
Platelets: 336 10*3/uL (ref 150–400)
RBC: 4.41 MIL/uL (ref 3.87–5.11)
RBC: 4.65 MIL/uL (ref 3.87–5.11)
RDW: 15.3 % (ref 11.5–15.5)
RDW: 15.4 % (ref 11.5–15.5)
WBC: 10.1 10*3/uL (ref 4.0–10.5)
WBC: 11.7 10*3/uL — ABNORMAL HIGH (ref 4.0–10.5)

## 2010-05-28 LAB — DIFFERENTIAL
Basophils Absolute: 0 10*3/uL (ref 0.0–0.1)
Basophils Absolute: 0 10*3/uL (ref 0.0–0.1)
Basophils Relative: 0 % (ref 0–1)
Basophils Relative: 0 % (ref 0–1)
Eosinophils Absolute: 0.4 10*3/uL (ref 0.0–0.7)
Eosinophils Absolute: 0.4 10*3/uL (ref 0.0–0.7)
Eosinophils Relative: 3 % (ref 0–5)
Eosinophils Relative: 4 % (ref 0–5)
Lymphocytes Relative: 10 % — ABNORMAL LOW (ref 12–46)
Lymphocytes Relative: 13 % (ref 12–46)
Lymphs Abs: 1 10*3/uL (ref 0.7–4.0)
Lymphs Abs: 1.5 10*3/uL (ref 0.7–4.0)
Monocytes Absolute: 0.3 10*3/uL (ref 0.1–1.0)
Monocytes Absolute: 0.4 10*3/uL (ref 0.1–1.0)
Monocytes Relative: 3 % (ref 3–12)
Monocytes Relative: 3 % (ref 3–12)
Neutro Abs: 8.4 10*3/uL — ABNORMAL HIGH (ref 1.7–7.7)
Neutro Abs: 9.4 10*3/uL — ABNORMAL HIGH (ref 1.7–7.7)
Neutrophils Relative %: 81 % — ABNORMAL HIGH (ref 43–77)
Neutrophils Relative %: 83 % — ABNORMAL HIGH (ref 43–77)

## 2010-05-28 LAB — ETHANOL: Alcohol, Ethyl (B): <5 mg/dL (ref 0–10)

## 2010-05-28 LAB — BASIC METABOLIC PANEL
BUN: 6 mg/dL (ref 6–23)
CO2: 27 mEq/L (ref 19–32)
Calcium: 9.4 mg/dL (ref 8.4–10.5)
Chloride: 104 mEq/L (ref 96–112)
Creatinine, Ser: 0.78 mg/dL (ref 0.4–1.2)
GFR calc Af Amer: >60 mL/min (ref >60)
GFR calc non Af Amer: >60 mL/min (ref >60)
Glucose, Bld: 109 mg/dL — ABNORMAL HIGH (ref 70–99)
Potassium: 4 mEq/L (ref 3.5–5.1)
Sodium: 138 mEq/L (ref 135–145)

## 2010-05-28 LAB — CONVERTED CEMR LAB
Basophils Absolute: 0 10*3/uL (ref 0.0–0.1)
Basophils Relative: 0 % (ref 0–1)
Eosinophils Absolute: 0.4 10*3/uL (ref 0.0–0.7)
Eosinophils Relative: 4 % (ref 0–5)
HCT: 37.3 % (ref 36.0–46.0)
Hemoglobin: 11.3 g/dL — ABNORMAL LOW (ref 12.0–15.0)
Lymphocytes Relative: 18 % (ref 12–46)
Lymphs Abs: 2 10*3/uL (ref 0.7–4.0)
MCHC: 30.3 g/dL (ref 30.0–36.0)
MCV: 81.6 fL (ref 78.0–100.0)
Monocytes Absolute: 0.6 10*3/uL (ref 0.1–1.0)
Monocytes Relative: 5 % (ref 3–12)
Neutro Abs: 7.9 10*3/uL — ABNORMAL HIGH (ref 1.7–7.7)
Neutrophils Relative %: 73 % (ref 43–77)
Platelets: 382 10*3/uL (ref 150–400)
RBC: 4.57 M/uL (ref 3.87–5.11)
RDW: 15.1 % (ref 11.5–15.5)
WBC: 10.9 10*3/uL — ABNORMAL HIGH (ref 4.0–10.5)

## 2010-05-28 LAB — T4, FREE: Free T4: 0.9 ng/dL (ref 0.80–1.80)

## 2010-05-28 LAB — URINE MICROSCOPIC-ADD ON

## 2010-05-28 LAB — LIPID PANEL
Cholesterol: 170 mg/dL (ref 0–200)
HDL: 49 mg/dL (ref >39)
LDL Cholesterol: 103 mg/dL — ABNORMAL HIGH (ref 0–99)
Total CHOL/HDL Ratio: 3.5 RATIO
Triglycerides: 88 mg/dL (ref <150)
VLDL: 18 mg/dL (ref 0–40)

## 2010-05-28 LAB — POCT CARDIAC MARKERS
CKMB, poc: <1 ng/mL — ABNORMAL LOW (ref 1.0–8.0)
Myoglobin, poc: 65.8 ng/mL (ref 12–200)
Troponin i, poc: <0.05 ng/mL (ref 0.00–0.09)

## 2010-05-28 LAB — MAGNESIUM: Magnesium: 2.2 mg/dL (ref 1.5–2.5)

## 2010-05-28 LAB — CARDIAC PANEL(CRET KIN+CKTOT+MB+TROPI)
CK, MB: 0.8 ng/mL (ref 0.3–4.0)
CK, MB: 1.3 ng/mL (ref 0.3–4.0)
Relative Index: INVALID (ref 0.0–2.5)
Relative Index: INVALID (ref 0.0–2.5)
Total CK: 41 U/L (ref 7–177)
Total CK: 50 U/L (ref 7–177)
Troponin I: 0.01 ng/mL (ref 0.00–0.06)
Troponin I: 0.02 ng/mL (ref 0.00–0.06)

## 2010-05-28 LAB — GLUCOSE, CAPILLARY: Glucose-Capillary: 98 mg/dL (ref 70–99)

## 2010-05-28 LAB — D-DIMER, QUANTITATIVE: D-Dimer, Quant: 0.65 ug/mL-FEU — ABNORMAL HIGH (ref 0.00–0.48)

## 2010-05-28 LAB — MRSA PCR SCREENING: MRSA by PCR: NEGATIVE

## 2010-05-28 LAB — TSH: TSH: 1.472 u[IU]/mL (ref 0.350–4.500)

## 2010-05-29 LAB — CBC
HCT: 32.8 % — ABNORMAL LOW (ref 36.0–46.0)
Hemoglobin: 11.2 g/dL — ABNORMAL LOW (ref 12.0–15.0)
MCHC: 34 g/dL (ref 30.0–36.0)
MCV: 75.9 fL — ABNORMAL LOW (ref 78.0–100.0)
Platelets: 367 10*3/uL (ref 150–400)
RBC: 4.32 MIL/uL (ref 3.87–5.11)
RDW: 15.3 % (ref 11.5–15.5)
WBC: 12.4 10*3/uL — ABNORMAL HIGH (ref 4.0–10.5)

## 2010-05-29 LAB — DIFFERENTIAL
Basophils Absolute: 0.1 10*3/uL (ref 0.0–0.1)
Basophils Relative: 1 % (ref 0–1)
Eosinophils Absolute: 0.2 10*3/uL (ref 0.0–0.7)
Eosinophils Relative: 2 % (ref 0–5)
Lymphocytes Relative: 19 % (ref 12–46)
Lymphs Abs: 2.3 10*3/uL (ref 0.7–4.0)
Monocytes Absolute: 0.6 10*3/uL (ref 0.1–1.0)
Monocytes Relative: 5 % (ref 3–12)
Neutro Abs: 9.2 10*3/uL — ABNORMAL HIGH (ref 1.7–7.7)
Neutrophils Relative %: 75 % (ref 43–77)

## 2010-05-29 LAB — BASIC METABOLIC PANEL
BUN: 7 mg/dL (ref 6–23)
CO2: 24 mEq/L (ref 19–32)
Calcium: 8.8 mg/dL (ref 8.4–10.5)
Chloride: 106 mEq/L (ref 96–112)
Creatinine, Ser: 0.71 mg/dL (ref 0.4–1.2)
GFR calc Af Amer: >60 mL/min (ref >60)
GFR calc non Af Amer: >60 mL/min (ref >60)
Glucose, Bld: 125 mg/dL — ABNORMAL HIGH (ref 70–99)
Potassium: 3.4 mEq/L — ABNORMAL LOW (ref 3.5–5.1)
Sodium: 136 mEq/L (ref 135–145)

## 2010-05-29 LAB — POCT CARDIAC MARKERS
CKMB, poc: <1 ng/mL — ABNORMAL LOW (ref 1.0–8.0)
Myoglobin, poc: 38.6 ng/mL (ref 12–200)
Troponin i, poc: <0.05 ng/mL (ref 0.00–0.09)

## 2010-05-29 LAB — HEPATIC FUNCTION PANEL
ALT: 18 U/L (ref 0–35)
AST: 17 U/L (ref 0–37)
Albumin: 3.1 g/dL — ABNORMAL LOW (ref 3.5–5.2)
Alkaline Phosphatase: 87 U/L (ref 39–117)
Bilirubin, Direct: 0.1 mg/dL (ref 0.0–0.3)
Indirect Bilirubin: 0 mg/dL — ABNORMAL LOW (ref 0.3–0.9)
Total Bilirubin: 0.1 mg/dL — ABNORMAL LOW (ref 0.3–1.2)
Total Protein: 6.9 g/dL (ref 6.0–8.3)

## 2010-05-30 ENCOUNTER — Encounter: Payer: Self-pay | Admitting: Infectious Diseases

## 2010-05-30 ENCOUNTER — Ambulatory Visit (INDEPENDENT_AMBULATORY_CARE_PROVIDER_SITE_OTHER): Payer: Managed Care, Other (non HMO) | Admitting: Infectious Diseases

## 2010-05-30 VITALS — BP 118/83 | HR 86 | Temp 97.9°F | Ht 65.0 in | Wt 269.5 lb

## 2010-05-30 DIAGNOSIS — E119 Type 2 diabetes mellitus without complications: Secondary | ICD-10-CM

## 2010-05-30 DIAGNOSIS — F411 Generalized anxiety disorder: Secondary | ICD-10-CM

## 2010-05-30 DIAGNOSIS — N61 Mastitis without abscess: Secondary | ICD-10-CM | POA: Insufficient documentation

## 2010-05-30 DIAGNOSIS — F419 Anxiety disorder, unspecified: Secondary | ICD-10-CM

## 2010-05-30 LAB — COMPREHENSIVE METABOLIC PANEL WITH GFR
ALT: 16 U/L (ref 0–35)
AST: 22 U/L (ref 0–37)
Albumin: 3.2 g/dL — ABNORMAL LOW (ref 3.5–5.2)
Alkaline Phosphatase: 88 U/L (ref 39–117)
BUN: 9 mg/dL (ref 6–23)
CO2: 23 meq/L (ref 19–32)
Calcium: 9.1 mg/dL (ref 8.4–10.5)
Chloride: 107 meq/L (ref 96–112)
Creatinine, Ser: 0.77 mg/dL (ref 0.4–1.2)
GFR calc non Af Amer: >60 mL/min
Glucose, Bld: 144 mg/dL — ABNORMAL HIGH (ref 70–99)
Potassium: 3.2 meq/L — ABNORMAL LOW (ref 3.5–5.1)
Sodium: 138 meq/L (ref 135–145)
Total Bilirubin: 0.2 mg/dL — ABNORMAL LOW (ref 0.3–1.2)
Total Protein: 7 g/dL (ref 6.0–8.3)

## 2010-05-30 LAB — CBC
HCT: 33.5 % — ABNORMAL LOW (ref 36.0–46.0)
Hemoglobin: 11.2 g/dL — ABNORMAL LOW (ref 12.0–15.0)
MCHC: 33.6 g/dL (ref 30.0–36.0)
MCV: 75.9 fL — ABNORMAL LOW (ref 78.0–100.0)
Platelets: 379 10*3/uL (ref 150–400)
RBC: 4.41 MIL/uL (ref 3.87–5.11)
RDW: 15.6 % — ABNORMAL HIGH (ref 11.5–15.5)
WBC: 10.7 10*3/uL — ABNORMAL HIGH (ref 4.0–10.5)

## 2010-05-30 LAB — URINALYSIS, ROUTINE W REFLEX MICROSCOPIC
Bilirubin Urine: NEGATIVE
Glucose, UA: NEGATIVE mg/dL
Hgb urine dipstick: NEGATIVE
Ketones, ur: NEGATIVE mg/dL
Nitrite: NEGATIVE
Protein, ur: NEGATIVE mg/dL
Specific Gravity, Urine: >1.03 — ABNORMAL HIGH (ref 1.005–1.030)
Urobilinogen, UA: 0.2 mg/dL (ref 0.0–1.0)
pH: 5.5 (ref 5.0–8.0)

## 2010-05-30 LAB — POCT I-STAT 4, (NA,K, GLUC, HGB,HCT)
Glucose, Bld: 100 mg/dL — ABNORMAL HIGH (ref 70–99)
HCT: 42 % (ref 36.0–46.0)
Hemoglobin: 14.3 g/dL (ref 12.0–15.0)
Potassium: 3.9 meq/L (ref 3.5–5.1)
Sodium: 143 meq/L (ref 135–145)

## 2010-05-30 MED ORDER — CLINDAMYCIN HCL 300 MG PO CAPS: 300.0000 mg | ORAL_CAPSULE | Freq: Three times a day (TID) | ORAL | Status: DC

## 2010-05-30 NOTE — Progress Notes (Signed)
  Subjective:    Patient ID: Brenda Richardson, female    DOB: 06-Dec-1980, 30 y.o.   MRN: [id number]  HPI 30 yo F with hx of DM and Biplolar disease previous breast reduction surgery August 14, 2008. She has had recurrent infections in her R upper quadrant of her breast since then. She has received multiple courses of anbx during this period. She has been hospitalized three times, last was hospitalized 04-08-10 to 04-04-10 and was treated with vancomycin. Her Mammo at that time showed no malignancy. Her CT chest showed mild skin thickening and minimal subQ infiltration. She was d/c home with augmentin. She underwent removal of "benign fibroadipose tissue" on 05-07-10. No culture. Afterwards she was treated with augmentin and then changed to Bactrim on 05-18-10 due to concerns about cellulitis. It initially started to clear up but she then became confused.  By Sunday (05-27-19), noted heaviness, and increased heat in her breast. Saw Dr Magnus Ivan and had "normal" fluid removed from her breast. No implants. No fever.  Started on metformin in January but she has since stopped as it hasn't agreed with her system. FSGs have been running 92/100/115.  Soc- No etoh or tobacco.  FHx- M died with Cervical Ca. F died with lung Ca.  Sister has DM   Review of Systems  Constitutional: Negative for fever and chills.  Respiratory: Negative for cough.   Gastrointestinal: Negative for diarrhea and constipation.  Genitourinary: Negative for dysuria.       Objective:   Physical Exam  Eyes: Conjunctivae are normal. Pupils are equal, round, and reactive to light.  Neck: Neck supple.  Cardiovascular: Normal rate, regular rhythm and normal heart sounds.   Pulmonary/Chest: No respiratory distress. She has no wheezes. Right breast exhibits mass and tenderness. Right breast exhibits no inverted nipple and no nipple discharge. Left breast exhibits no inverted nipple.    Abdominal: Soft. Bowel sounds are normal. She exhibits  distension.  Musculoskeletal: She exhibits edema.  Lymphadenopathy:       Right cervical: No superficial cervical and no posterior cervical adenopathy present.      Left cervical: No superficial cervical and no posterior cervical adenopathy present.       Right axillary: No pectoral and no lateral adenopathy present.       Left axillary: No pectoral and no lateral adenopathy present.  Examined with nurse in room.        Assessment & Plan:

## 2010-05-31 ENCOUNTER — Emergency Department (HOSPITAL_COMMUNITY): Payer: Managed Care, Other (non HMO)

## 2010-05-31 ENCOUNTER — Emergency Department (HOSPITAL_COMMUNITY)
Admission: EM | Admit: 2010-05-31 | Discharge: 2010-05-31 | Disposition: A | Discharge status: Home / Self Care | Payer: Managed Care, Other (non HMO) | Attending: Emergency Medicine | Admitting: Emergency Medicine

## 2010-05-31 DIAGNOSIS — R079 Chest pain, unspecified: Secondary | ICD-10-CM | POA: Insufficient documentation

## 2010-05-31 DIAGNOSIS — F319 Bipolar disorder, unspecified: Secondary | ICD-10-CM | POA: Insufficient documentation

## 2010-05-31 DIAGNOSIS — Z79899 Other long term (current) drug therapy: Secondary | ICD-10-CM | POA: Insufficient documentation

## 2010-05-31 DIAGNOSIS — H9209 Otalgia, unspecified ear: Secondary | ICD-10-CM | POA: Insufficient documentation

## 2010-05-31 DIAGNOSIS — I10 Essential (primary) hypertension: Secondary | ICD-10-CM | POA: Insufficient documentation

## 2010-06-03 LAB — COMPREHENSIVE METABOLIC PANEL
ALT: 18 U/L (ref 0–35)
AST: 17 U/L (ref 0–37)
Albumin: 2.9 g/dL — ABNORMAL LOW (ref 3.5–5.2)
Alkaline Phosphatase: 71 U/L (ref 39–117)
BUN: 10 mg/dL (ref 6–23)
CO2: 26 mEq/L (ref 19–32)
Calcium: 9 mg/dL (ref 8.4–10.5)
Chloride: 106 mEq/L (ref 96–112)
Creatinine, Ser: 0.65 mg/dL (ref 0.4–1.2)
GFR calc Af Amer: >60 mL/min (ref >60)
GFR calc non Af Amer: >60 mL/min (ref >60)
Glucose, Bld: 106 mg/dL — ABNORMAL HIGH (ref 70–99)
Potassium: 3.4 mEq/L — ABNORMAL LOW (ref 3.5–5.1)
Sodium: 138 mEq/L (ref 135–145)
Total Bilirubin: 0.5 mg/dL (ref 0.3–1.2)
Total Protein: 7.2 g/dL (ref 6.0–8.3)

## 2010-06-03 LAB — DIFFERENTIAL
Basophils Absolute: 0 10*3/uL (ref 0.0–0.1)
Basophils Relative: 0 % (ref 0–1)
Eosinophils Absolute: 0.4 10*3/uL (ref 0.0–0.7)
Eosinophils Relative: 3 % (ref 0–5)
Lymphocytes Relative: 16 % (ref 12–46)
Lymphs Abs: 2 10*3/uL (ref 0.7–4.0)
Monocytes Absolute: 0.6 10*3/uL (ref 0.1–1.0)
Monocytes Relative: 5 % (ref 3–12)
Neutro Abs: 9 10*3/uL — ABNORMAL HIGH (ref 1.7–7.7)
Neutrophils Relative %: 74 % (ref 43–77)

## 2010-06-03 LAB — URINALYSIS, ROUTINE W REFLEX MICROSCOPIC
Bilirubin Urine: NEGATIVE
Glucose, UA: NEGATIVE mg/dL
Hgb urine dipstick: NEGATIVE
Ketones, ur: NEGATIVE mg/dL
Nitrite: NEGATIVE
Protein, ur: NEGATIVE mg/dL
Specific Gravity, Urine: 1.025 (ref 1.005–1.030)
Urobilinogen, UA: 0.2 mg/dL (ref 0.0–1.0)
pH: 6 (ref 5.0–8.0)

## 2010-06-03 LAB — LIPASE, BLOOD: Lipase: 19 U/L (ref 11–59)

## 2010-06-03 LAB — CBC
HCT: 33.6 % — ABNORMAL LOW (ref 36.0–46.0)
Hemoglobin: 11.3 g/dL — ABNORMAL LOW (ref 12.0–15.0)
MCHC: 33.7 g/dL (ref 30.0–36.0)
MCV: 76 fL — ABNORMAL LOW (ref 78.0–100.0)
Platelets: 377 10*3/uL (ref 150–400)
RBC: 4.42 MIL/uL (ref 3.87–5.11)
RDW: 16 % — ABNORMAL HIGH (ref 11.5–15.5)
WBC: 12.1 10*3/uL — ABNORMAL HIGH (ref 4.0–10.5)

## 2010-06-07 NOTE — Miscellaneous (Signed)
Summary: Orders Update  Clinical Lists Changes  Orders: Added new Test order of T-CBC w/Diff ([id number]) - Signed 

## 2010-06-13 ENCOUNTER — Ambulatory Visit (INDEPENDENT_AMBULATORY_CARE_PROVIDER_SITE_OTHER): Payer: Managed Care, Other (non HMO) | Admitting: Infectious Diseases

## 2010-06-13 VITALS — BP 142/87 | HR 82 | Temp 98.0°F | Ht 65.0 in | Wt 269.0 lb

## 2010-06-13 DIAGNOSIS — N61 Mastitis without abscess: Secondary | ICD-10-CM

## 2010-06-13 DIAGNOSIS — E669 Obesity, unspecified: Secondary | ICD-10-CM

## 2010-06-13 MED ORDER — CLINDAMYCIN HCL 300 MG PO CAPS: 300.0000 mg | ORAL_CAPSULE | Freq: Three times a day (TID) | ORAL | Status: AC

## 2010-06-13 NOTE — Assessment & Plan Note (Signed)
Will refill her clindamycin for her to take for 2 more weeks. She will f/u with Dr Rayburn Ma in the intervening period. She will return to ID clinic in 2 weeks.

## 2010-06-13 NOTE — Progress Notes (Signed)
  Subjective:    Patient ID: Brenda Richardson, female    DOB: February 13, 1981, 30 y.o.   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 She has received multiple courses of anbx during this period. She has been hospitalized three times, last was hospitalized 30 to 04-04-10 and was treated with vancomycin. Her Mammo at that time showed no malignancy. Her CT chest showed mild skin thickening and minimal subQ infiltration. She was d/c home with augmentin. She underwent removal of "benign fibroadipose tissue" on 05-07-10. No culture. Afterwards she was treated with augmentin and then changed to Bactrim on 05-18-10 due to concerns about cellulitis. It initially started to clear up but she then became confused.  By Sunday (32-18-21), noted heaviness, and increased heat in her breast. Saw Dr Magnus Ivan and had "normal" fluid removed from her breast. No implants. No fever.  Started on metformin in January but she has since stopped as it hasn't agreed with her system. FSGs have been running 92/100/115.  She was started on clindamycin 2 weeks ago in ID clinic. Still has some pain and has knot in her breast. Skin has not broken down, no wound d/c. Still has some times where she has warmth. No problems with clinda- gi distress.     Review of Systems     Objective:   Physical Exam  Constitutional: She appears well-developed and well-nourished.  Pulmonary/Chest:    Lymphadenopathy:       Right cervical: No superficial cervical and no deep cervical adenopathy present.      Right axillary: No pectoral and no lateral adenopathy present.          Assessment & Plan:

## 2010-06-14 LAB — URINALYSIS, ROUTINE W REFLEX MICROSCOPIC
Bilirubin Urine: NEGATIVE
Bilirubin Urine: NEGATIVE
Glucose, UA: NEGATIVE mg/dL
Glucose, UA: NEGATIVE mg/dL
Hgb urine dipstick: NEGATIVE
Hgb urine dipstick: NEGATIVE
Ketones, ur: NEGATIVE mg/dL
Ketones, ur: NEGATIVE mg/dL
Nitrite: NEGATIVE
Nitrite: NEGATIVE
Protein, ur: NEGATIVE mg/dL
Protein, ur: NEGATIVE mg/dL
Specific Gravity, Urine: 1.01 (ref 1.005–1.030)
Specific Gravity, Urine: 1.025 (ref 1.005–1.030)
Urobilinogen, UA: 0.2 mg/dL (ref 0.0–1.0)
Urobilinogen, UA: 0.2 mg/dL (ref 0.0–1.0)
pH: 6 (ref 5.0–8.0)
pH: 6 (ref 5.0–8.0)

## 2010-06-14 LAB — BASIC METABOLIC PANEL
BUN: 11 mg/dL (ref 6–23)
BUN: 13 mg/dL (ref 6–23)
CO2: 28 mEq/L (ref 19–32)
CO2: 23 mEq/L (ref 19–32)
Calcium: 9.7 mg/dL (ref 8.4–10.5)
Calcium: 8.8 mg/dL (ref 8.4–10.5)
Chloride: 102 mEq/L (ref 96–112)
Chloride: 109 mEq/L (ref 96–112)
Creatinine, Ser: 0.93 mg/dL (ref 0.4–1.2)
Creatinine, Ser: 0.75 mg/dL (ref 0.4–1.2)
GFR calc Af Amer: >60 mL/min (ref >60)
GFR calc Af Amer: >60 mL/min (ref >60)
GFR calc non Af Amer: >60 mL/min (ref >60)
GFR calc non Af Amer: >60 mL/min (ref >60)
Glucose, Bld: 99 mg/dL (ref 70–99)
Glucose, Bld: 91 mg/dL (ref 70–99)
Potassium: 3.6 mEq/L (ref 3.5–5.1)
Potassium: 3.7 mEq/L (ref 3.5–5.1)
Sodium: 136 mEq/L (ref 135–145)
Sodium: 138 mEq/L (ref 135–145)

## 2010-06-14 LAB — RAPID URINE DRUG SCREEN, HOSP PERFORMED
Amphetamines: NOT DETECTED
Barbiturates: NOT DETECTED
Benzodiazepines: POSITIVE — AB
Cocaine: NOT DETECTED
Opiates: POSITIVE — AB
Tetrahydrocannabinol: NOT DETECTED

## 2010-06-14 LAB — CBC
HCT: 33.7 % — ABNORMAL LOW (ref 36.0–46.0)
Hemoglobin: 11.2 g/dL — ABNORMAL LOW (ref 12.0–15.0)
MCHC: 33.2 g/dL (ref 30.0–36.0)
MCV: 77.4 fL — ABNORMAL LOW (ref 78.0–100.0)
Platelets: 375 10*3/uL (ref 150–400)
RBC: 4.35 MIL/uL (ref 3.87–5.11)
RDW: 15.7 % — ABNORMAL HIGH (ref 11.5–15.5)
WBC: 11.1 10*3/uL — ABNORMAL HIGH (ref 4.0–10.5)

## 2010-06-14 LAB — DIFFERENTIAL
Basophils Absolute: 0 10*3/uL (ref 0.0–0.1)
Basophils Relative: 0 % (ref 0–1)
Eosinophils Absolute: 0.3 10*3/uL (ref 0.0–0.7)
Eosinophils Relative: 3 % (ref 0–5)
Lymphocytes Relative: 18 % (ref 12–46)
Lymphs Abs: 2 10*3/uL (ref 0.7–4.0)
Monocytes Absolute: 0.5 10*3/uL (ref 0.1–1.0)
Monocytes Relative: 4 % (ref 3–12)
Neutro Abs: 8.3 10*3/uL — ABNORMAL HIGH (ref 1.7–7.7)
Neutrophils Relative %: 75 % (ref 43–77)

## 2010-06-14 LAB — PREGNANCY, URINE: Preg Test, Ur: NEGATIVE

## 2010-06-16 LAB — CBC
HCT: 35.4 % — ABNORMAL LOW (ref 36.0–46.0)
Hemoglobin: 11.6 g/dL — ABNORMAL LOW (ref 12.0–15.0)
MCHC: 32.6 g/dL (ref 30.0–36.0)
MCV: 78.6 fL (ref 78.0–100.0)
Platelets: 424 10*3/uL — ABNORMAL HIGH (ref 150–400)
RBC: 4.5 MIL/uL (ref 3.87–5.11)
RDW: 14.5 % (ref 11.5–15.5)
WBC: 14.8 10*3/uL — ABNORMAL HIGH (ref 4.0–10.5)

## 2010-06-16 LAB — DIFFERENTIAL
Basophils Absolute: 0.1 10*3/uL (ref 0.0–0.1)
Basophils Relative: 1 % (ref 0–1)
Eosinophils Absolute: 0.2 10*3/uL (ref 0.0–0.7)
Eosinophils Relative: 1 % (ref 0–5)
Lymphocytes Relative: 15 % (ref 12–46)
Lymphs Abs: 2.2 10*3/uL (ref 0.7–4.0)
Monocytes Absolute: 0.5 10*3/uL (ref 0.1–1.0)
Monocytes Relative: 3 % (ref 3–12)
Neutro Abs: 11.8 10*3/uL — ABNORMAL HIGH (ref 1.7–7.7)
Neutrophils Relative %: 80 % — ABNORMAL HIGH (ref 43–77)

## 2010-06-16 LAB — POCT PREGNANCY, URINE: Preg Test, Ur: NEGATIVE

## 2010-06-16 LAB — BASIC METABOLIC PANEL
BUN: 10 mg/dL (ref 6–23)
CO2: 26 mEq/L (ref 19–32)
Calcium: 9 mg/dL (ref 8.4–10.5)
Chloride: 103 mEq/L (ref 96–112)
Creatinine, Ser: 0.8 mg/dL (ref 0.4–1.2)
GFR calc Af Amer: >60 mL/min (ref >60)
GFR calc non Af Amer: >60 mL/min (ref >60)
Glucose, Bld: 78 mg/dL (ref 70–99)
Potassium: 3.5 mEq/L (ref 3.5–5.1)
Sodium: 139 mEq/L (ref 135–145)

## 2010-06-16 LAB — URINALYSIS, ROUTINE W REFLEX MICROSCOPIC
Glucose, UA: NEGATIVE mg/dL
Hgb urine dipstick: NEGATIVE
Ketones, ur: NEGATIVE mg/dL
Nitrite: NEGATIVE
Protein, ur: NEGATIVE mg/dL
Specific Gravity, Urine: 1.029 (ref 1.005–1.030)
Urobilinogen, UA: 0.2 mg/dL (ref 0.0–1.0)
pH: 5.5 (ref 5.0–8.0)

## 2010-06-16 LAB — POCT CARDIAC MARKERS
CKMB, poc: <1 ng/mL — ABNORMAL LOW (ref 1.0–8.0)
Myoglobin, poc: 42.2 ng/mL (ref 12–200)
Troponin i, poc: <0.05 ng/mL (ref 0.00–0.09)

## 2010-06-18 LAB — BASIC METABOLIC PANEL
BUN: 9 mg/dL (ref 6–23)
CO2: 27 mEq/L (ref 19–32)
Calcium: 9.1 mg/dL (ref 8.4–10.5)
Chloride: 104 mEq/L (ref 96–112)
Creatinine, Ser: 0.62 mg/dL (ref 0.4–1.2)
GFR calc Af Amer: >60 mL/min (ref >60)
GFR calc non Af Amer: >60 mL/min (ref >60)
Glucose, Bld: 110 mg/dL — ABNORMAL HIGH (ref 70–99)
Potassium: 3.7 mEq/L (ref 3.5–5.1)
Sodium: 139 mEq/L (ref 135–145)

## 2010-06-18 LAB — POCT HEMOGLOBIN-HEMACUE: Hemoglobin: 11.7 g/dL — ABNORMAL LOW (ref 12.0–15.0)

## 2010-06-21 LAB — CBC
HCT: 33.2 % — ABNORMAL LOW (ref 36.0–46.0)
Hemoglobin: 11.2 g/dL — ABNORMAL LOW (ref 12.0–15.0)
MCHC: 33.8 g/dL (ref 30.0–36.0)
MCV: 79.3 fL (ref 78.0–100.0)
Platelets: 367 10*3/uL (ref 150–400)
RBC: 4.19 MIL/uL (ref 3.87–5.11)
RDW: 14.7 % (ref 11.5–15.5)
WBC: 10.9 10*3/uL — ABNORMAL HIGH (ref 4.0–10.5)

## 2010-06-21 LAB — BASIC METABOLIC PANEL
BUN: 5 mg/dL — ABNORMAL LOW (ref 6–23)
CO2: 29 mEq/L (ref 19–32)
Calcium: 8.6 mg/dL (ref 8.4–10.5)
Chloride: 104 mEq/L (ref 96–112)
Creatinine, Ser: 0.75 mg/dL (ref 0.4–1.2)
GFR calc Af Amer: >60 mL/min (ref >60)
GFR calc non Af Amer: >60 mL/min (ref >60)
Glucose, Bld: 93 mg/dL (ref 70–99)
Potassium: 3.4 mEq/L — ABNORMAL LOW (ref 3.5–5.1)
Sodium: 137 mEq/L (ref 135–145)

## 2010-06-21 LAB — POCT CARDIAC MARKERS
CKMB, poc: <1 ng/mL — ABNORMAL LOW (ref 1.0–8.0)
Myoglobin, poc: 37.1 ng/mL (ref 12–200)
Troponin i, poc: <0.05 ng/mL (ref 0.00–0.09)

## 2010-06-21 LAB — DIFFERENTIAL
Basophils Absolute: 0 10*3/uL (ref 0.0–0.1)
Basophils Relative: 0 % (ref 0–1)
Eosinophils Absolute: 0.2 10*3/uL (ref 0.0–0.7)
Eosinophils Relative: 2 % (ref 0–5)
Lymphocytes Relative: 21 % (ref 12–46)
Lymphs Abs: 2.2 10*3/uL (ref 0.7–4.0)
Monocytes Absolute: 0.5 10*3/uL (ref 0.1–1.0)
Monocytes Relative: 5 % (ref 3–12)
Neutro Abs: 7.9 10*3/uL — ABNORMAL HIGH (ref 1.7–7.7)
Neutrophils Relative %: 72 % (ref 43–77)

## 2010-06-21 LAB — D-DIMER, QUANTITATIVE: D-Dimer, Quant: 2.61 ug/mL-FEU — ABNORMAL HIGH (ref 0.00–0.48)

## 2010-06-27 ENCOUNTER — Ambulatory Visit: Payer: Managed Care, Other (non HMO) | Admitting: Infectious Diseases

## 2010-07-14 ENCOUNTER — Emergency Department (HOSPITAL_COMMUNITY)
Admission: EM | Admit: 2010-07-14 | Discharge: 2010-07-14 | Disposition: A | Discharge status: Home / Self Care | Payer: Managed Care, Other (non HMO) | Attending: Emergency Medicine | Admitting: Emergency Medicine

## 2010-07-14 ENCOUNTER — Emergency Department (HOSPITAL_COMMUNITY): Payer: Managed Care, Other (non HMO)

## 2010-07-14 DIAGNOSIS — R51 Headache: Secondary | ICD-10-CM | POA: Insufficient documentation

## 2010-07-14 DIAGNOSIS — F319 Bipolar disorder, unspecified: Secondary | ICD-10-CM | POA: Insufficient documentation

## 2010-07-14 DIAGNOSIS — R079 Chest pain, unspecified: Secondary | ICD-10-CM | POA: Insufficient documentation

## 2010-07-14 DIAGNOSIS — K5730 Diverticulosis of large intestine without perforation or abscess without bleeding: Secondary | ICD-10-CM | POA: Insufficient documentation

## 2010-07-14 DIAGNOSIS — F411 Generalized anxiety disorder: Secondary | ICD-10-CM | POA: Insufficient documentation

## 2010-07-14 DIAGNOSIS — M542 Cervicalgia: Secondary | ICD-10-CM | POA: Insufficient documentation

## 2010-07-14 DIAGNOSIS — G40802 Other epilepsy, not intractable, without status epilepticus: Secondary | ICD-10-CM | POA: Insufficient documentation

## 2010-07-14 DIAGNOSIS — I10 Essential (primary) hypertension: Secondary | ICD-10-CM | POA: Insufficient documentation

## 2010-07-14 DIAGNOSIS — Z79899 Other long term (current) drug therapy: Secondary | ICD-10-CM | POA: Insufficient documentation

## 2010-07-14 DIAGNOSIS — R599 Enlarged lymph nodes, unspecified: Secondary | ICD-10-CM | POA: Insufficient documentation

## 2010-07-14 DIAGNOSIS — R070 Pain in throat: Secondary | ICD-10-CM | POA: Insufficient documentation

## 2010-07-14 DIAGNOSIS — H9209 Otalgia, unspecified ear: Secondary | ICD-10-CM | POA: Insufficient documentation

## 2010-07-14 LAB — URINALYSIS, ROUTINE W REFLEX MICROSCOPIC
Bilirubin Urine: NEGATIVE
Glucose, UA: NEGATIVE mg/dL
Hgb urine dipstick: NEGATIVE
Ketones, ur: NEGATIVE mg/dL
Nitrite: NEGATIVE
Protein, ur: NEGATIVE mg/dL
Specific Gravity, Urine: >1.03 — ABNORMAL HIGH (ref 1.005–1.030)
Urobilinogen, UA: 0.2 mg/dL (ref 0.0–1.0)
pH: 5.5 (ref 5.0–8.0)

## 2010-07-14 LAB — CBC
HCT: 37.8 % (ref 36.0–46.0)
Hemoglobin: 12 g/dL (ref 12.0–15.0)
MCH: 25.5 pg — ABNORMAL LOW (ref 26.0–34.0)
MCHC: 31.7 g/dL (ref 30.0–36.0)
MCV: 80.3 fL (ref 78.0–100.0)
Platelets: 371 10*3/uL (ref 150–400)
RBC: 4.71 MIL/uL (ref 3.87–5.11)
RDW: 14.9 % (ref 11.5–15.5)
WBC: 12.7 10*3/uL — ABNORMAL HIGH (ref 4.0–10.5)

## 2010-07-14 LAB — BASIC METABOLIC PANEL
BUN: 12 mg/dL (ref 6–23)
CO2: 29 mEq/L (ref 19–32)
Calcium: 9.9 mg/dL (ref 8.4–10.5)
Chloride: 99 mEq/L (ref 96–112)
Creatinine, Ser: 0.82 mg/dL (ref 0.4–1.2)
GFR calc Af Amer: >60 mL/min (ref >60)
GFR calc non Af Amer: >60 mL/min (ref >60)
Glucose, Bld: 123 mg/dL — ABNORMAL HIGH (ref 70–99)
Potassium: 3.6 mEq/L (ref 3.5–5.1)
Sodium: 139 mEq/L (ref 135–145)

## 2010-07-17 ENCOUNTER — Ambulatory Visit: Payer: Managed Care, Other (non HMO) | Admitting: Infectious Diseases

## 2010-07-24 NOTE — Op Note (Signed)
Brenda Richardson, Brenda Richardson              ACCOUNT NO.:  [id number]   MEDICAL RECORD NO.:  [id number]          PATIENT TYPE:  AMB   LOCATION:  DSC                          FACILITY:  [facility]   PHYSICIAN:  Mary Contogiannis, M.D.DATE OF BIRTH:  [birth year]   DATE OF PROCEDURE:  08/16/2008  DATE OF DISCHARGE:                               OPERATIVE REPORT   PREOPERATIVE DIAGNOSIS:  Bilateral macromastia.   POSTOPERATIVE DIAGNOSIS:  Bilateral macromastia.   PROCEDURE:  Bilateral reduction mammoplasties.   ATTENDING SURGEON:  Brantley Persons, MD   ANESTHESIA:  General.   ESTIMATED BLOOD LOSS:  200 mL.   FLUID REPLACEMENT:  2900 mL of crystalloid.   URINE OUTPUT:  300 mL.   COMPLICATIONS:  None.   INDICATIONS FOR PROCEDURE:  The patient is a 30 year old African  American female who has bilateral macromastia that is clinically  symptomatic.  She presents to undergo bilateral reduction mammoplasties.   PROCEDURE:  The patient was marked in the preop holding area in a  pattern of WIC for the future bilateral reduction mammoplasties.  She  was then taken back to the OR and placed on the table in the supine  position.  After adequate general anesthesia was obtained, the patient's  chest was prepped with Techni-Care and draped in a sterile fashion.  The  base of the breasts was then injected with 1% lidocaine with  epinephrine.  After adequate hemostasis and anesthesia had taken effect,  the procedure was begun.   Both of the breast reductions were performed in the following similar  manner.  The nipple-areolar complexes were marked with 45 mm nipple  marker.  The skin was then incised and de-epithelialized around the  nipple-areolar complex down to the inframammary crease in inferior  pedicle pattern.  Next, the medial, superior, and lateral skin flaps  were elevated down to the chest wall.  The excess fat and glandular  tissue were removed from the inferior pedicle.  The wound was  irrigated  with saline irrigation.  Meticulous hemostasis was obtained with the  Bovie electrocautery.  The inferior pedicle was examined and found to be  pink and viable.  The inferior pedicle was centralized using 3-0 Prolene  suture.  A #10 JP flat fully fluted drain was placed into the wound.  The skin flaps were brought together at the inverted T-junction with a 2-  0 Prolene suture.  The incisions were stapled for temporary closure.  The breasts were compared and found to have good shape and symmetry.  These incisions were then closed from the medial aspect of the JP drain  to the medial aspect of the [facility] incision by first placing a few 3-0  Monocryl sutures to loosely tack it together and dermal layer.  Then  both the dermal and cuticular layers were closed in single layer using a  2-0 Quill PDO barbed suture.  Lateral to the JP drain incision was  closed using 3-0 Monocryl in the dermal layer followed by 3-0 Monocryl  running intracuticular stitch on the skin.   The patient was placed in the upright position.  The future  location of  the nipple-areolar complex was marked on both breast mounds using the 45-  mm nipple marker.  She was then placed back in the recumbent position.   Both of the nipple-areolar complexes were then brought out onto the  breast mounds in the following similar manner.  The skin was incised as  marked.  The tissue was removed full thickness into the soft tissues.  The nipple-areolar complex was examined and found to be pink and viable,  then brought out through this aperture and sewn in place using 4-0  Monocryl in the dermal layer followed by 5-0 Monocryl running  intracuticular stitch on the skin.  The vertical limb of the [facility] pattern  had been closed in the dermal layer with 3-0 Monocryl suture and then  this 5-0 Monocryl suture was brought down from the nipple-areolar  complex to close the cuticular layer of the vertical limb as well.  The  JP drain  was sewn in place using 3-0 nylon suture.  The incisions were  dressed with benzoin and Steri-Strips.  The base of the breast and skin  and soft tissues were infiltrated with 0.5% Marcaine with epinephrine to  provide a postsurgical anesthetic block.  The 4 x 4s were placed over  the incisions and the patient was placed in slight postoperative support  bra.  There were no complications.  The patient tolerated the procedure  well.  The final needle and sponge counts were reported to be correct at  the end of the case.   The patient was then awakened from general anesthesia and taken to the  recovery room in stable condition.  She was also recovered without  complications.  The patient decided she would rather go home than stay  overnight.  This was appropriate.  The patient and her family were given  appropriate postoperative wound care instructions including care of the  JP drains.  The patient was then discharged home in the care of her  family in stable condition.  Followup appointment will be on Friday in  the office.           ______________________________  Brantley Persons, M.D.     MC/MEDQ  D:  08/16/2008  T:  08/17/2008  Job:  [id number]

## 2010-07-24 NOTE — H&P (Signed)
NAME:  RENETA, NIEHAUS              ACCOUNT NO.:  [id number]   MEDICAL RECORD NO.:  [id number]          PATIENT TYPE:  AMB   LOCATION:  DAY                           FACILITY:  [facility]   PHYSICIAN:  R. Roetta Sessions, M.D. DATE OF BIRTH:  10/02/1980   DATE OF ADMISSION:  DATE OF DISCHARGE:  LH                              HISTORY & PHYSICAL   CHIEF COMPLAINT:  Nausea and vomiting for 1 month/hepatic lesions.   HPI:  Mrs. Yera is a 30 year old female who has been evaluated by [place]  previously for abdominal pain and hematochezia.  She was initially  evaluated by Dr. Jena Gauss on September 27, 2005.  She had a history of right  lower quadrant abdominal pain.  She underwent colonoscopy which was  benign.  She had anal canal hemorrhoids on November 21, 2005.  Due to  the fact she had a leukocytosis, she was sent to Dr. Lovell Sheehan and  underwent exploratory diagnostic laparoscopy and laparoscopic  appendectomy.  Since that time she has been pain-free.  Her procedure  was performed on May 26, 2006.  Approximately 1 month ago she  developed nausea and vomiting, she is having symptoms about 2 days out  of every week where every time she eats she has nausea and vomiting.  Her last emesis was approximately 1 week ago.  She was found to have  bilirubin in her urine.  She was sent for an abdominal ultrasound.  It  showed two small hepatic lesions.  This was followed by an MRI on October 06, 2006, which showed a 1.4 x 1.3 cm lesion in the anterior segment of  the right liver lobe, possibly a Riedel's lobe, as well as a 1.1 x 1.1  cm lesion in the central right hepatic lobe.  These lesions are felt to  represent [facility] focal nodular hyperplasia.  She had LFTs on September 20, 2006,  which were normal.  She also had a normal comprehensive metabolic panel.  She had a white blood cell count of 9.9, hemoglobin 12.7, hematocrit  41.1, platelet count of 263,000 and granulocytes were 80%.   She tells me she lost about 8  pounds, however she has gained it back.  She denies any aspirin or NSAID use.  She is status post tubal ligation.  She denies any diarrhea or constipation.  Denies any rectal bleeding or  melena.  She has noticed some anorexia recently.   CURRENT MEDICATIONS:  Denies any.   PAST MEDICAL/SURGICAL HISTORY:  1. She is status post laparoscopic appendectomy by Dr. Lovell Sheehan on      May 26, 2006.  2. She has a history of chronic right lower quadrant pain and      leukocytosis.  3. She had a normal colonoscopy and terminal ileoscopy except for      hemorrhoids in 2007.  4. She had a normal CT scan on September 02, 2005, except for prominence of      the liver, possible right lobe.  5. 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  6. She is status post tubal ligation and cholecystectomy.   ALLERGIES:  1. VICODIN.  2. NUBAIN.   FAMILY HISTORY:  No known history of colorectal carcinoma, liver or  current GI problems.   SOCIAL HISTORY:  1. Mrs. Yaun is married.  2. Has 3 children.  3. She works at the [employer].  4. Denies any tobacco or drug use.   REVIEW OF SYSTEMS:  See HPI, otherwise negative.   PHYSICAL EXAM:  VITAL SIGNS:  Weight 238 pounds, height 66 inches, temp  98.3, blood pressure 128/90 and pulse 80.  GENERAL:  Mrs. Maffeo is an obese African-American female who is alert,  oriented, pleasant, cooperative, in no acute distress.  HEENT:  __________ sclera clear, nonicteric, conjunctiva pink.  Oropharynx pink and moist without any lesions.  NECK:  Supple with no masses or thyromegaly.  CHEST:  Heart regular rate and rhythm, normal S1, S2 without murmurs,  clicks, rubs or gallops.  LUNGS:  Clear to auscultation bilaterally.  ABDOMEN:  Positive bowel sounds x4.  No bruits auscultated.  Soft,  nontender, nondistended, without palpable mass or hepatosplenomegaly.  No rebound, tenderness or guarding.  Exam was  limited due to the  patient's body habitus.  EXTREMITIES:  Without clubbing or edema bilaterally.  Skin brown, warm  and dry without any rash or jaundice.   IMPRESSION:  Mrs. Shedden is a 30 year old female with intermittent  nausea and vomiting for a month now.  She should undergo further  evaluation with EGD to rule out peptic ulcer disease.  She also has two  small hepatic lesions with CT findings suggestive of focal nodular  hyperplasia, a benign condition.  It is reassuring that her LFTs are  normal.   PLAN:  1. An MRI in 6 months for followup on hepatic lesions.  2. An EGD with Dr. Jena Gauss in near future.  I discussed the procedure      including risks and benefits which include, but are not limited,      bleeding, infection, perforation, drug reaction, she agrees and      signed consent will be obtained.  3. Phenergan 25 mg one p.o. q.6h. p.r.n. nausea, vomiting, we aware of      sedation, #15 with no refills.  4. Further recommendations pending EGD.      Lorenza Burton, N.P.      Jonathon Bellows, M.D.  Electronically Signed    KJ/MEDQ  D:  10/13/2006  T:  10/13/2006  Job:  [id number]   cc:   Franchot Heidelberg, M.D.

## 2010-07-24 NOTE — Op Note (Signed)
NAME:  FREDDIE, NGHIEM              ACCOUNT NO.:  [id number]   MEDICAL RECORD NO.:  [id number]          PATIENT TYPE:  AMB   LOCATION:  DAY                           FACILITY:  [facility]   PHYSICIAN:  R. Roetta Sessions, M.D. DATE OF BIRTH:  07-17-1980   DATE OF PROCEDURE:  DATE OF DISCHARGE:                               OPERATIVE REPORT   PROCEDURE:  EGD diagnostic.   INDICATIONS FOR PROCEDURE:  This is a 30 year old lady with a several-  week history of nausea and vomiting.  EGD now being done. The procedure  was discussed with the patient at length.  Potential risks, benefits and  alternatives have been reviewed, questions answered.  She is agreeable.  Please see __________  medical record __________  O2 saturation, blood  pressure, pulse and respirations were monitored throughout the entire  procedure.   CONSCIOUS SEDATION:  Versed 4 mg IV, Demerol 100 mg IV in divided doses.   INSTRUMENT:  Pentax video chip system.   FINDINGS:  Examination of the tubular esophagus revealed no mucosal  abnormalities.  EG junction was patulous.  There was noted to be  free/florid reflux of gastric fluid during the procedure.   Stomach:  Gastric cavity was emptied and insufflated well with air.  A  thorough examination of the gastric mucosa with retroflexion of the  proximal stomach esophagogastric junction demonstrated only a small  hiatal hernia.  Pylorus patent and easily traversed.  Examination of the  bulb and second portion revealed no abnormalities.   THERAPEUTIC/DIAGNOSTIC MANEUVERS PERFORMED:  None.   The patient tolerated the procedure well __________ .   IMPRESSION:  Florid gastroesophageal reflux noted during the procedure  but normal esophageal mucosa, patulous EG junction, small hiatal hernia,  otherwise normal stomach __________ .   I suspect the patient may have gastroesophageal reflux disease to  account for the majority of her nausea and vomiting.   RECOMMENDATIONS:  1.  Brief course of aggressive acid suppression therapy with Aciphex 20      mg orally daily before breakfast and supper.  A 2-week supply of      samples was provided through the office and prescription.      Antireflux literature      provided.  2. Plan for follow-up appointment with me in 1 month.  Should she have      been persisting symptoms of nausea and vomiting, would consider a      solid phase gastric emptying study.      Jonathon Bellows, M.D.  Electronically Signed     RMR/MEDQ  D:  10/23/2006  T:  10/24/2006  Job:  [id number]   cc:   Franchot Heidelberg, M.D.

## 2010-07-24 NOTE — Assessment & Plan Note (Signed)
[facility]                        CARDIOLOGY OFFICE NOTE   SHELBI, VACCARO                     MRN:          [id number]  DATE:06/08/2008                            DOB:          07/02/1980    I was asked by Dr. Erby Pian to consult on Brenda Richardson with the chief  complaint of chest pain.   Over the last couple of weeks, Brenda Richardson has been having a sharp, stabbing  pain in the center of Brenda Richardson chest.  Brenda Richardson went to the emergency room where  Brenda Richardson had negative cardiac enzymes, normal EKG and a CT scan which showed  no pulmonary embolus on May 31, 2008.  Brenda Richardson says the pain continues.   Brenda Richardson does not have any exertional component to the discomfort.  Brenda Richardson  denies any orthopnea, PND, peripheral edema, early satiety, tachy  palpitations, syncope, or presyncope.  Brenda Richardson has had no fever or chills.   Brenda Richardson denies any chest trauma.   PAST MEDICAL HISTORY:  Significant for hysterectomy, appendectomy.  Brenda Richardson  has had a colonoscopy the past.  Brenda Richardson does have hypertension but has been  under good control.  In fact Brenda Richardson lisinopril was stopped the other day  because Brenda Richardson pressures were low.   Brenda Richardson is allergic to Vicodin and Nubain.  Brenda Richardson does not smoke or drink.   CURRENT MEDICATIONS:  1. Amlodipine 5 mg per day.  2. Citalopram 20 mg per day.  3. Brenda Richardson has been taking some oxycodone for the chest discomfort, but it      makes Brenda Richardson sleepy.   SOCIAL HISTORY:  Brenda Richardson is unemployed.  Brenda Richardson exercises.  Brenda Richardson is married and  has 3 children.   FAMILY HISTORY:  Negative for premature coronary artery disease.   REVIEW OF SYSTEMS:  Brenda Richardson has some headaches, occasional nausea, but  otherwise Brenda Richardson review of systems are negative.  All systems were  questioned on our diagnostic evaluation form.   Remarkably, Brenda Richardson had a cholesterol panel done the other day which showed  an incredibly good panel with the total cholesterol of 132 and an LDL of  74.  Brenda Richardson HDL was slightly low at 43.  Brenda Richardson total  cholesterol-to-HDL  ratio, however, was 3.1.   PHYSICAL EXAMINATION:  VITAL SIGNS:  Brenda Richardson is 5 feet 6, weighs 347.  BMI  is high.  Brenda Richardson blood pressure is 116/78 in the right arm, Brenda Richardson pulse is 68  and regular.  HEENT:  Normal.  NECK:  Carotid upstrokes were equal bilaterally without bruits.  No JVD.  Thyroid is not enlarged.  Trachea is midline.  Neck is supple.  CHEST:  Lungs are clear to auscultation and percussion.  HEART:  Regular rate and rhythm.  PMI was hard to appreciate, S2 splits.  There is no rub.  ABDOMEN:  Soft, good bowel sounds.  Nondistended.  Organomegaly cannot  be assessed.  EXTREMITIES:  No edema.  Pulses are intact.  No sign of DVT.  NEURO:  Intact.   Brenda Richardson EKG shows sinus rhythm with no PR depression, no ST-segment changes  other than some T-wave inversion in V1 through V3 which is  really  unchanged from the other day.   ASSESSMENT:  1. Noncoronary chest pain.  2. No evidence of pulmonary emboli by recent CT scan.  3. Probable musculoskeletal pain, rule out pericarditis.  4. Hypertension, under good control.  5. Obesity.   PLAN:  2-D echocardiogram.  If this is negative, we will see Brenda Richardson back on  a p.r.n. basis.  If it is musculoskeletal, we have told Brenda Richardson to take anti-  inflammatories, use heat, and avoid exacerbating factors.     Thomas C. Daleen Squibb, MD, [facility]  Electronically Signed    TCW/MedQ  DD: 06/08/2008  DT: 06/08/2008  Job #: [id number]   cc:   Franchot Heidelberg, M.D.

## 2010-07-24 NOTE — Procedures (Signed)
[name], Brenda Richardson              ACCOUNT NO.:  [id number]   MEDICAL RECORD NO.:  [id number]          PATIENT TYPE:  OUT   LOCATION:  RAD                           FACILITY:  [facility]   PHYSICIAN:  Peter C. Eden Emms, MD, FACCDATE OF BIRTH:  09/17/1980   DATE OF PROCEDURE:  04/15/2007  DATE OF DISCHARGE:                                ECHOCARDIOGRAM   Check LV function, abnormal electrocardiogram and increased left atrial  size.  Left ventricular cavity size was normal, there was moderate LVH,  septal thickness was 16 mm, EF was 60%.  There were no regional wall  motion abnormalities, there was mild left atrial enlargement, right-  sided cardiac chambers were normal.  There was no evidence of pulmonary  hypertension, there was mild TR.  The aortic valve was not well  visualized.  There was sclerosis with no stenosis.  The mitral valve was  structurally normal.  Subcostal imaging revealed no atrial septal  defect, no source of embolus, no effusion.   FINAL IMPRESSION:  1. Moderate left ventricular hypertrophy, ejection fraction 60      percent.  2. Mild left atrial enlargement (42 millimeters).  3. Aortic valve sclerosis.  4. Normal mitral valve.  5. No pericardiale effusion.  6. Normal right ventricle.      Noralyn Pick. Eden Emms, MD, [facility]  Electronically Signed     PCN/MEDQ  D:  04/15/2007  T:  04/16/2007  Job:  [id number]   cc:   Franchot Heidelberg, M.D.

## 2010-07-24 NOTE — Op Note (Signed)
[name], Brenda Richardson              ACCOUNT NO.:  [id number]   MEDICAL RECORD NO.:  [id number]          PATIENT TYPE:  INP   LOCATION:  A319                          FACILITY:  [facility]   PHYSICIAN:  Lazaro Arms, M.D.   DATE OF BIRTH:  09/30/1980   DATE OF PROCEDURE:  08/12/2007  DATE OF DISCHARGE:                               OPERATIVE REPORT   PREOPERATIVE DIAGNOSES:  1. Menometrorrhagia.  2. Dysmenorrhea.  3. Chronic pelvic pain.  4. Dyspareunia.   POSTOPERATIVE DIAGNOSES:  1. Menometrorrhagia.  2. Dysmenorrhea.  3. Chronic pelvic pain.  4. Dyspareunia.   PROCEDURE:  Abdominal hysterectomy.   SURGEON:  Lazaro Arms, MD   ANESTHESIA:  General endotracheal.   FINDINGS:  The patient had what appeared to be a normal uterus, tubes,  and ovaries.  Maybe the uterus was boggy, probably had adenomyosis or  endometriosis or evidence of infection or other problems.   DESCRIPTION OF OPERATION:  The patient was taken to the operating room,  placed in supine position under general endotracheal anesthesia.  The  vagina was prepped.  Foley catheter was placed.  Abdomen was prepped and  draped in the usual sterile fashion.  A Pfannenstiel skin incision was  made and entered sharply to the rectus fascia, and scored in midline and  extended laterally.  Fascia taken out the muscle superiorly and  inferiorly without difficulty.  The muscle were divided, and the cavity  was entered.  An Alexis self-retaining wound retractor was placed.  The  peritoneum was then packed away, and the patient was placed in  Trendelenburg position.  The uterine cornua were grasped.  The left  round ligaments were suture ligated and cut.  The right round ligaments  were suture ligated and cut.  The utero-ovarian ligaments were clamped,  cut, and suture ligated bilaterally.  Vesicouterine  aspiration flap was  created flap was created.  The uterine vessels were skeletonized and  clamped, cut, and suture  ligated.  A 0 pedicle was taken down the cervix  to the cornua ligaments.  Each pedicle being clamped, cut, and suture  ligated.  The vagina was cross clamped.  Specimens removed.  Vaginal  angled sutures were placed.  The vagina was closed with interrupted  figure-of-eight sutures.  The pelvis was irrigated vigorously.  All  pedicles were found to be hemostatic.  The wound retractor was removed.  The lap tapes were removed.  The muscles and peritoneum reapproximated  loosely.  The fascia closed using 0-Vicryl running.  Subcutaneous tissue  was made  hemostatic and irrigated.  The skin was closed using skin staples.  The  patient tolerated the procedure well.  She experienced 250 mL of blood  loss.  She was taken to the recovery room in good and stable condition.  All counts were correct x3.  She received Ancef preoperatively.      Lazaro Arms, M.D.  Electronically Signed     LHE/MEDQ  D:  08/12/2007  T:  08/13/2007  Job:  [id number]

## 2010-07-27 NOTE — Discharge Summary (Signed)
Brenda Richardson, Brenda Richardson              ACCOUNT NO.:  [id number]   MEDICAL RECORD NO.:  [id number]          PATIENT TYPE:  INP   LOCATION:  A319                          FACILITY:  [facility]   PHYSICIAN:  Lazaro Arms, M.D.   DATE OF BIRTH:  [birth year]   DATE OF ADMISSION:  08/12/2007  DATE OF DISCHARGE:  06/05/2009LH                               DISCHARGE SUMMARY   DISCHARGE DIAGNOSES:  1. Status post abdominal hysterectomy.  2. Unremarkable postoperative course.   PROCEDURE PERFORMED:  Abdominal hysterectomy.   Please refer to the history of physical and operative report for details  of admission to the hospital.   HOSPITAL COURSE:  The patient was admitted postoperatively.  She  tolerated clear liquids and a regular diet, voided without symptoms, and  was extensively ambulatory.  Her postoperative hemoglobins and  hematocrits were stable.  Her incision was clean, dry, and intact.  Her  abdominal exam was benign.  She had progression of  flatus and bowel  movement.  She was discharged to home on the morning of postop day #2,  in good and stable condition.  To follow up in the office next week to  have her incision evaluated.  Discharged home on Percocet and Motrin for  pain.      Lazaro Arms, M.D.  Electronically Signed     LHE/MEDQ  D:  08/27/2007  T:  08/27/2007  Job:  [id number]

## 2010-07-27 NOTE — H&P (Signed)
NAME:  Brenda Richardson, Brenda Richardson              ACCOUNT NO.:  [id number]   MEDICAL RECORD NO.:  [id number]          PATIENT TYPE:  AMB   LOCATION:  DAY                           FACILITY:  [facility]   PHYSICIAN:  Dalia Heading, M.D.  DATE OF BIRTH:  27-May-1980   DATE OF ADMISSION:  DATE OF DISCHARGE:  LH                                HISTORY & PHYSICAL   CHIEF COMPLAINT:  Neoplasm, left shoulder.   HISTORY OF PRESENT ILLNESS:  The patient is a 30 year old black female who  is referred for evaluation and treatment of a mass on her left shoulder.  It  has been present for some time, but is increasing in size and causing her  discomfort.   PAST MEDICAL HISTORY:  Is unremarkable.   PAST SURGICAL HISTORY:  Cholecystectomy, tubal ligation.   CURRENT MEDICATIONS:  None.   ALLERGIES:  NO KNOWN DRUG ALLERGIES.   REVIEW OF SYSTEMS:  Noncontributory.   PHYSICAL EXAMINATION:  GENERAL:  The patient is a well-developed, well-  nourished black female in no acute distress.  LUNGS:  Clear to auscultation with equal breath sounds bilaterally.  HEART:  Examination reveals regular rate and rhythm without S3, S4, or  murmurs.  SKIN:  Examination reveals a 4 cm subcutaneous mass present along the  superior left shoulder region.  Tender to touch.   IMPRESSION:  Neoplasm, subcutaneous, left shoulder, unspecified.   PLAN:  The patient is scheduled for excision of the mass, left shoulder on  11/13/2005.  Risks and benefits of procedure including bleeding, infection,  and recurrence of the mass were fully explained to the patient, who gave  informed consent.      Dalia Heading, M.D.  Electronically Signed     MAJ/MEDQ  D:  11/07/2005  T:  11/07/2005  Job:  [id number]   cc:   Tilda Burrow, M.D.  Fax: [phone]

## 2010-07-27 NOTE — H&P (Signed)
NAME:  Brenda Richardson, Brenda Richardson              ACCOUNT NO.:  [id number]   MEDICAL RECORD NO.:  [id number]          PATIENT TYPE:  AMB   LOCATION:  DAY                           FACILITY:  [facility]   PHYSICIAN:  Dalia Heading, M.D.  DATE OF BIRTH:  1980-06-14   DATE OF ADMISSION:  DATE OF DISCHARGE:  LH                              HISTORY & PHYSICAL   CHIEF COMPLAINT:  Abdominal pain of unknown etiology.   HISTORY OF PRESENT ILLNESS:  The patient is a 30 year old black female  who is referred for evaluation and treatment of lower abdominal pain of  unknown etiology.  It has been present for many months and is increasing  in frequency and intensity.  She has had a gynecological workup which  has been reportedly negative.  GI workup has also been negative.  She is  being referred for diagnostic laparoscopy with incidental appendectomy.  No nausea, vomiting, diarrhea, or constipation have been noted.  Her  periods have been unremarkable.   PAST MEDICAL HISTORY:  As noted above.   PAST SURGICAL HISTORY:  Lipoma of shoulder excision, cholecystectomy,  tubal ligation.   CURRENT MEDICATIONS:  None except tramadol p.r.n.   ALLERGIES:  No known drug allergies.   REVIEW OF SYSTEMS:  Noncontributory.   PHYSICAL EXAMINATION:  GENERAL:  The patient is a well-developed, well-  nourished black female in no acute distress.  LUNGS:  Clear to auscultation with equal breath sounds bilaterally.  HEART:  A regular rate and rhythm without S3, S4, or murmurs.  ABDOMEN:  Soft and nondistended.  She is tender in the right lower  quadrant over McBurney's point, though this is nonspecific.  Minimal  left lower quadrant abdominal pain is noted.  No hepatosplenomegaly,  masses, or hernias are identified.   IMPRESSION:  Abdominal pain of unknown etiology.   PLAN:  The patient is scheduled for diagnostic laparoscopy with  appendectomy on May 26, 2006.  The risks and benefits of the procedure  including bleeding,  infection, and recurrence of the pain were fully  explained to the patient, who gave informed consent.      Dalia Heading, M.D.  Electronically Signed     MAJ/MEDQ  D:  05/22/2006  T:  05/23/2006  Job:  [id number]   cc:   [facility]  Fax: [phone]   R. Roetta Sessions, M.D.  [address]  [place]  [place] [address]   Kirstie Peri, MD  Fax: [phone]

## 2010-07-27 NOTE — Consult Note (Signed)
NAME:  Brenda Richardson, Brenda Richardson              ACCOUNT NO.:  [id number]   MEDICAL RECORD NO.:  [id number]           PATIENT TYPE:   LOCATION:                                 FACILITY:   PHYSICIAN:  R. Roetta Sessions, M.D. DATE OF BIRTH:  1980-04-16   DATE OF CONSULTATION:  09/27/2005  DATE OF DISCHARGE:                                   CONSULTATION   REASON FOR CONSULTATION:  Two-month history of right lower quadrant  abdominal pain.   HISTORY OF PRESENT ILLNESS:  Brenda Richardson is a pleasant 30 year old African-  American female from [place], seen through the courtesy of Dr. Sherryll Burger to  further evaluate 32-month history of right lower quadrant abdominal pain with  intermittent associated nausea and vomiting and diarrhea, occasional blood  in her stool.  Symptoms started suddenly 2 months ago.  Symptoms not changed  by eating or having a bowel movement, although she does occasionally have  diarrhea associated with symptoms, occasionally passed gross blood per  rectum.  She also has intermittent nausea, and pain may radiate towards her  umbilicus from time to time.  Her gallbladder was removed by Dr. Gabriel Cirri  several years ago. She had GYN evaluation by Dr. Gilford Silvius back in October of  last year.  They check her, and she is due to be seen there in 2 months.  She has not lost any weight.  She tells me she if picks up her child or goes  over a bump in the road in her car, that exacerbates the right lower  quadrant pain.  CT scan of the abdomen and pelvis September 02, 2005,  demonstrated nothing to suggest appendicitis.  There was prominence of the  liver which may reflect a right a Riedel's lobe with focal fat seen in the  medial segment of the left lobe which was stable compared to a prior study.  Otherwise, nothing to explain her symptoms.  She does not have any urinary  tract symptoms.  There is not really a radicular component to her symptoms.  She has been using ibuprofen on occasion for the  above-mentioned pain.  She  was given a prescription for Bentyl, Vicodin, and Cipro, and she had trouble  taking Bentyl, and Vicodin made her feel bad . She took Cipro which did not  make any difference in her symptoms. She has never had similar symptoms  previously, although her gallbladder pain was lower than normal.  It was  below her umbilicus in origin on the right side, and her current pain is  emanating just below that area.   PAST MEDICAL HISTORY:  Unremarkable for chronic illnesses.   PAST SURGICAL HISTORY:  1.  Tubal ligation.  2.  Cholecystectomy.   CURRENT MEDICATIONS:  Ibuprofen p.r.n.   ALLERGIES:  No known drug allergies.   FAMILY HISTORY:  Mother succumbed to uterine cancer at age 7.  Father died  of lung cancer when he was in his 29s.  No history of GI or liver illness.   SOCIAL HISTORY:  The patient is married, has 3 children. 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  No tobacco, no alcohol.   REVIEW OF SYSTEMS:  No chest pain, no dyspnea on exertion, no fever or  chills. She has lost 6 pounds in the past 2 months.   PHYSICAL EXAMINATION:  GENERAL:  Pleasant 30 year old lady resting  comfortably.  VITAL SIGNS: Weight 240.5, height 5 feet 6 inches.  Temperature 98, blood  pressure 124/80, pulse 80.  SKIN: Warm and dry.  HEENT: No scleral icterus.  Conjunctivae pink.  Oral cavity without lesions.  CHEST: Lungs ae clear to auscultation.  CARDIAC: Regular rate and rhythm without murmur, gallop, or rub.  BREASTS:  Exam deferred.  ABDOMEN: Obese.  Positive bowel sounds, soft.  She does have localized right  lower quadrant tenderness in the suprapubic area just to the right of  midline.  There is no appreciable mass or rebound.  There is no CVA  tenderness.  EXTREMITIES:  No edema.  RECTAL:  Good sphincter tone, no mass in the rectal vault.  No stool in the  rectal vault.  Mucus is hemoccult negative.   IMPRESSION:  Brenda Richardson is a pleasant 30 year old  lady with right  lower quadrant abdominal pain with intermittent associated nausea, vomiting  and diarrhea.  I get the sense that there is a mechanical component to her  symptoms.  There are multiple possibilities at this time which would include  chronic appendicitis, adhesive disease, an occult lesion in her right colon.  Terminal ileum would be less likely but possible.  I doubt this is a  radicular pain.  I suppose she could have some occult GYN pathology.   RECOMMENDATIONS:  Since she is having right lower quadrant abdominal pain,  intermittent bleeding, and diarrhea, we will go ahead and office to study  with colonoscopy to get a good look at her right colon and terminal ileum.  If that is unrevealing, then I would suggest a GYN evaluation.  If that is  unrevealing as to a etiology of her symptoms, the we would have contemplate  a diagnostic laparoscopy with incidental appendectomy. We are going to check  a set of LFTs and a urinalysis today.  I discussed approach of colonoscopy  with Brenda Richardson.  Potential risks, benefits, and alternatives have been  reviewed, questions answered.  She is agreeable.  Will make further  recommendations in the very near future.   I would like to thank Dr. Sherryll Burger for allowing me to see this nice lady today.      Jonathon Bellows, M.D.  Electronically Signed     RMR/MEDQ  D:  09/27/2005  T:  09/27/2005  Job:  [id number]

## 2010-07-27 NOTE — Op Note (Signed)
Brenda Richardson, Brenda Richardson              ACCOUNT NO.:  [id number]   MEDICAL RECORD NO.:  [id number]          PATIENT TYPE:  AMB   LOCATION:  DAY                           FACILITY:  [facility]   PHYSICIAN:  Dalia Heading, M.D.  DATE OF BIRTH:  Mar 19, 1980   DATE OF PROCEDURE:  11/13/2005  DATE OF DISCHARGE:                                 OPERATIVE REPORT   PREOPERATIVE DIAGNOSIS:  Neoplasm, left shoulder.   POSTOPERATIVE DIAGNOSIS:  Neoplasm, left shoulder.   PROCEDURE:  Excision of neoplasm, left shoulder.   SURGEON:  Dr. Franky Macho.   ANESTHESIA:  General.   INDICATIONS:  The patient is a 30 year old black female who is referred for  removal of an enlarging mass on her left shoulder.  The risks and benefits  of the procedure including bleeding, infection, and recurrence of mass were  fully explained to the patient, who gave informed consent.   PROCEDURE NOTE:  The patient was placed in the supine position.  After  general anesthesia was administered, the left shoulder region was prepped  and draped using the usual sterile technique with Betadine.  Surgical site  confirmation was performed.   The 4-cm subcutaneous mass was noted along the superior aspect of the left  shoulder.  A longitudinal incision was made to the subcutaneous tissue.  The  mass was excised without difficulty.  It appeared to be a lipoma.  It was  sent to pathology further examination.  The subcutaneous layer was  reapproximated using 3-0 Vicryl interrupted suture.  The skin was closed  using a 4-0 Vicryl subcuticular suture.  Sensorcaine 0.5% was instilled into  the surrounding wound.  Dermabond was then applied.   All tape and needle counts were correct at the end of the procedure.  The  patient was awakened and transferred to PACU in stable condition.  Complications none.   SPECIMEN:  Neoplasm, left shoulder.   BLOOD LOSS:  Minimal.      Dalia Heading, M.D.  Electronically Signed     MAJ/MEDQ   D:  11/13/2005  T:  11/13/2005  Job:  [id number]   cc:   Tilda Burrow, M.D.  Fax: [phone]   Clelia Croft, M.D.  [employer]

## 2010-07-27 NOTE — Op Note (Signed)
NAME:  Brenda Richardson, Brenda Richardson              ACCOUNT NO.:  [id number]   MEDICAL RECORD NO.:  [id number]          PATIENT TYPE:  AMB   LOCATION:  DAY                           FACILITY:  [facility]   PHYSICIAN:  Dalia Heading, M.D.  DATE OF BIRTH:  [birth year]   DATE OF PROCEDURE:  05/26/2006  DATE OF DISCHARGE:                               OPERATIVE REPORT   PREOPERATIVE DIAGNOSIS:  Pelvic pain/abdominal pain of unknown etiology.   POSTOPERATIVE DIAGNOSIS:  Pelvic pain/abdominal pain of unknown  etiology, possible chronic appendicitis.   PROCEDURE:  Diagnostic laparoscopy, laparoscopic appendectomy.   SURGEON:  Dr. Franky Macho.   ANESTHESIA:  General endotracheal.   INDICATIONS:  The patient is a 30 year old black female who has had  worsening pelvic and lower abdominal pain for the last few months.  GI  workup has been negative.  Gynecologic workup has been reportedly  negative.  The patient now is referred for diagnostic laparoscopy,  possible appendectomy.  Risks and benefits of the procedure including  bleeding, infection, and the recurrence of pain were fully explained to  the patient, gave informed consent.   PROCEDURE NOTE:  The patient was placed in the supine position.  After  induction of general endotracheal anesthesia, the abdomen was prepped  and draped in the usual sterile technique with Betadine.  Surgical site  confirmation was performed.   A supraumbilical incision was made down to the fascia.  Veress needle  was introduced into the abdominal cavity and confirmation of placement  was done using the saline drop test.  The abdomen was then insufflated  to 16 mmHg pressure.  An 11 mm trocar was introduced into the abdominal  cavity under direct visualization without difficulty.  The patient was  placed in deeper Trendelenburg position and an additional 11 mm trocar  was placed in the suprapubic region and a 5 mm trocar was placed in the  left lower quadrant region.  The  uterus and ovaries were noted to be  within normal limits.  There was evidence of a previous tubal ligation.  The terminal ileum was inspected and no abnormalities were found.  No  Meckel's diverticulum was seen.  The appendix was visualized and noted  be somewhat injected.  It was elected to proceed with an appendectomy.  The mesoappendix was divided using the harmonic scalpel.  A vascular  Endo-GIA was placed across the base the appendix and fired.  The  appendix was removed using EndoCatch bag.  The staple line was inspected  and noted to be normal limits.  All air was then evacuate from the  pelvis and abdomen prior to removal of the trocars.   All wounds were irrigated with normal saline.  All wounds were checked  with 0.5% Sensorcaine.  The supraumbilical fascia as well as suprapubic  fascia reapproximated using 0 Vicryl interrupted sutures.  All skin  incisions were closed using staples.  Betadine ointment and dry sterile  dressings were applied.   All tape and needle counts were correct at the end of the procedure.  The patient was extubated in the operating room and  went back to the  recovery room awake in stable condition.   COMPLICATIONS:  None.   SPECIMEN:  Appendix.   BLOOD LOSS:  Minimal.      Dalia Heading, M.D.  Electronically Signed     MAJ/MEDQ  D:  05/26/2006  T:  05/26/2006  Job:  [id number]   cc:   R. Roetta Sessions, M.D.  [address]  [place]  [place] [address]   Kirstie Peri, MD  Fax: [phone]

## 2010-07-27 NOTE — Op Note (Signed)
NAME:  Brenda Richardson, Brenda Richardson              ACCOUNT NO.:  [id number]   MEDICAL RECORD NO.:  [id number]          PATIENT TYPE:  AMB   LOCATION:  DAY                           FACILITY:  [facility]   PHYSICIAN:  R. Roetta Sessions, M.D. DATE OF BIRTH:  03/13/1980   DATE OF PROCEDURE:  11/21/2005  DATE OF DISCHARGE:                                 OPERATIVE REPORT   PROCEDURE:  Colonoscopy with ileoscopy.   INDICATIONS FOR PROCEDURE:  The patient is a 30 year old African-American  lady with right lower quadrant abdominal pain, intermittent nausea and  vomiting, intermittent low volume hematochezia.  CT scan demonstrated  nothing to suggest appendicitis or other abnormality [facility]).  Through my office, September 30, 2005, LFTs came back normal, white count 10.8,  minimal anemia with an H&H of 11.5 and 36.5, MCV 78.5.  Urinalysis negative.  Colonoscopy now being done to further evaluate right lower quadrant  abdominal pain and hematochezia.  This approach has been discussed with the  patient at length.  Potential risks, benefits, and alternatives have been  reviewed, questions answered.  She is agreeable.  Please see documentation  in medical record.   PROCEDURE NOTE:  O2 saturations, blood pressure, pulse, respirations were  monitored throughout the entire procedure.   CONSCIOUS SEDATION:  Versed 3 mg IV, Demerol 75 mg IV in divided doses.   INSTRUMENT:  Olympus video chip system.   FINDINGS:  Digital rectal examination revealed no abnormalities __________.  Prep was good.   RECTUM AND COLON EXAMINATION:  Rectal mucosa clear to retroflexed view of  the anal verge __________anal canal demonstrated only some anal canal  hemorrhoids.   COLON:  Colonic mucosa was surveyed from the rectosigmoid junction through  the left transverse and right colon to the appendiceal orifice, ileocecal  valve, and cecum.  These structures were well-seen and photographed for the  record.  Terminal ileum was  intubated a good 15 cm.  From this level, the  scope was slowly withdrawn.  All previously imaged mucosal surfaces were  seen once again as the scope was slowly and cautiously withdrawn.  The  colonic mucosa appeared normal.  The terminal ileum mucosa appeared normal.  The patient tolerated the procedure well and was reacted in endoscopy.   IMPRESSION:  1. Anal canal hemorrhoids, likely cause of low volume hematochezia.      Otherwise, normal rectum, normal colon.  2. Normal terminal ileum.   RECOMMENDATIONS:  1. Proceed with a gynecologic evaluation even though she had one last      year.  She now has new symptoms.  If that is unrevealing, then the next      step would be a diagnostic laparoscopy with incidental appendectomy.      Further recommendations to follow.  2. A 10-day course of Anusol HC suppositories 1 per rectum at bedtime.  3. Hemorrhoid literature provided to Ms. Lablanc.      Jonathon Bellows, M.D.  Electronically Signed     RMR/MEDQ  D:  10/21/2005  T:  10/21/2005  Job:  [id number]   cc:   Dr. Sherryll Burger  [facility]  [place]

## 2010-07-30 ENCOUNTER — Ambulatory Visit (HOSPITAL_COMMUNITY)
Admission: RE | Admit: 2010-07-30 | Discharge: 2010-07-30 | Disposition: A | Discharge status: Home / Self Care | Payer: Managed Care, Other (non HMO) | Source: Ambulatory Visit | Attending: Family Medicine | Admitting: Family Medicine

## 2010-07-30 ENCOUNTER — Other Ambulatory Visit: Payer: Self-pay | Admitting: Family Medicine

## 2010-07-30 DIAGNOSIS — M545 Low back pain, unspecified: Secondary | ICD-10-CM

## 2010-07-30 DIAGNOSIS — M79609 Pain in unspecified limb: Secondary | ICD-10-CM | POA: Insufficient documentation

## 2010-07-30 DIAGNOSIS — M79605 Pain in left leg: Secondary | ICD-10-CM

## 2010-07-30 DIAGNOSIS — M7989 Other specified soft tissue disorders: Secondary | ICD-10-CM

## 2010-07-31 ENCOUNTER — Inpatient Hospital Stay (HOSPITAL_COMMUNITY): Admission: RE | Admit: 2010-07-31 | Payer: Managed Care, Other (non HMO) | Source: Ambulatory Visit

## 2010-08-02 ENCOUNTER — Encounter (INDEPENDENT_AMBULATORY_CARE_PROVIDER_SITE_OTHER): Payer: Self-pay | Admitting: Surgery

## 2010-08-08 ENCOUNTER — Ambulatory Visit (HOSPITAL_COMMUNITY)
Admission: RE | Admit: 2010-08-08 | Discharge: 2010-08-08 | Disposition: A | Discharge status: Home / Self Care | Payer: Managed Care, Other (non HMO) | Source: Ambulatory Visit | Attending: Family Medicine | Admitting: Family Medicine

## 2010-08-08 DIAGNOSIS — M545 Low back pain, unspecified: Secondary | ICD-10-CM

## 2010-08-08 DIAGNOSIS — M5126 Other intervertebral disc displacement, lumbar region: Secondary | ICD-10-CM | POA: Insufficient documentation

## 2010-08-27 ENCOUNTER — Other Ambulatory Visit: Payer: Self-pay | Admitting: Internal Medicine

## 2010-08-27 ENCOUNTER — Other Ambulatory Visit: Payer: Self-pay | Admitting: Family Medicine

## 2010-08-27 ENCOUNTER — Ambulatory Visit (HOSPITAL_COMMUNITY)
Admission: RE | Admit: 2010-08-27 | Discharge: 2010-08-27 | Disposition: A | Discharge status: Home / Self Care | Payer: Managed Care, Other (non HMO) | Source: Ambulatory Visit | Attending: Family Medicine | Admitting: Family Medicine

## 2010-08-27 DIAGNOSIS — M25473 Effusion, unspecified ankle: Secondary | ICD-10-CM | POA: Insufficient documentation

## 2010-08-27 DIAGNOSIS — S9030XA Contusion of unspecified foot, initial encounter: Secondary | ICD-10-CM | POA: Insufficient documentation

## 2010-08-27 DIAGNOSIS — W208XXA Other cause of strike by thrown, projected or falling object, initial encounter: Secondary | ICD-10-CM | POA: Insufficient documentation

## 2010-08-27 DIAGNOSIS — T148XXA Other injury of unspecified body region, initial encounter: Secondary | ICD-10-CM

## 2010-08-27 DIAGNOSIS — M25579 Pain in unspecified ankle and joints of unspecified foot: Secondary | ICD-10-CM | POA: Insufficient documentation

## 2010-08-27 DIAGNOSIS — M25476 Effusion, unspecified foot: Secondary | ICD-10-CM | POA: Insufficient documentation

## 2010-08-28 LAB — CBC WITH DIFFERENTIAL/PLATELET
Basophils Absolute: 0 10*3/uL (ref 0.0–0.1)
Basophils Relative: 0 % (ref 0–1)
Eosinophils Absolute: 0.4 10*3/uL (ref 0.0–0.7)
Eosinophils Relative: 4 % (ref 0–5)
HCT: 37.4 % (ref 36.0–46.0)
Hemoglobin: 11.5 g/dL — ABNORMAL LOW (ref 12.0–15.0)
Lymphocytes Relative: 23 % (ref 12–46)
Lymphs Abs: 2.7 10*3/uL (ref 0.7–4.0)
MCH: 24.9 pg — ABNORMAL LOW (ref 26.0–34.0)
MCHC: 30.7 g/dL (ref 30.0–36.0)
MCV: 81.1 fL (ref 78.0–100.0)
Monocytes Absolute: 0.5 10*3/uL (ref 0.1–1.0)
Monocytes Relative: 4 % (ref 3–12)
Neutro Abs: 7.8 10*3/uL — ABNORMAL HIGH (ref 1.7–7.7)
Neutrophils Relative %: 69 % (ref 43–77)
Platelets: 408 10*3/uL — ABNORMAL HIGH (ref 150–400)
RBC: 4.61 MIL/uL (ref 3.87–5.11)
RDW: 14.9 % (ref 11.5–15.5)
WBC: 11.4 10*3/uL — ABNORMAL HIGH (ref 4.0–10.5)

## 2010-08-29 ENCOUNTER — Emergency Department (HOSPITAL_COMMUNITY)
Admission: EM | Admit: 2010-08-29 | Discharge: 2010-08-29 | Disposition: A | Discharge status: Home / Self Care | Payer: Medicaid Other | Attending: Emergency Medicine | Admitting: Emergency Medicine

## 2010-08-29 DIAGNOSIS — R110 Nausea: Secondary | ICD-10-CM | POA: Insufficient documentation

## 2010-08-29 DIAGNOSIS — R51 Headache: Secondary | ICD-10-CM | POA: Insufficient documentation

## 2010-08-29 DIAGNOSIS — E119 Type 2 diabetes mellitus without complications: Secondary | ICD-10-CM | POA: Insufficient documentation

## 2010-08-29 LAB — URINALYSIS, ROUTINE W REFLEX MICROSCOPIC
Bilirubin Urine: NEGATIVE
Glucose, UA: 250 mg/dL — AB
Hgb urine dipstick: NEGATIVE
Ketones, ur: NEGATIVE mg/dL
Leukocytes, UA: NEGATIVE
Nitrite: NEGATIVE
Protein, ur: NEGATIVE mg/dL
Specific Gravity, Urine: >1.03 — ABNORMAL HIGH (ref 1.005–1.030)
Urobilinogen, UA: 0.2 mg/dL (ref 0.0–1.0)
pH: 6.5 (ref 5.0–8.0)

## 2010-08-29 LAB — GLUCOSE, CAPILLARY: Glucose-Capillary: 251 mg/dL — ABNORMAL HIGH (ref 70–99)

## 2010-09-21 ENCOUNTER — Other Ambulatory Visit (INDEPENDENT_AMBULATORY_CARE_PROVIDER_SITE_OTHER): Payer: Self-pay | Admitting: General Surgery

## 2010-09-21 DIAGNOSIS — K3580 Unspecified acute appendicitis: Secondary | ICD-10-CM

## 2010-09-21 DIAGNOSIS — G8918 Other acute postprocedural pain: Secondary | ICD-10-CM

## 2010-09-24 ENCOUNTER — Ambulatory Visit (INDEPENDENT_AMBULATORY_CARE_PROVIDER_SITE_OTHER): Payer: Medicaid Other | Admitting: Surgery

## 2010-09-24 DIAGNOSIS — N644 Mastodynia: Secondary | ICD-10-CM

## 2010-09-24 NOTE — Progress Notes (Signed)
Subjective:     Patient ID: Brenda Richardson, female   DOB: 08-18-1980, 30 y.o.   MRN: [id number]  HPI She is here today for evaluation of her chronic pain. Again I operated on her breast in February for chronic recurrent infections that recurred after breast reduction. She reports  recently she started having pain in her right breast. She has no drainage from her nipple. She denies trauma. The pain is sharp and constant.  Review of Systems     Objective:   Physical Exam On exam, her right breast incisions are well healed. There is no erythema or induration or palpable masses.    Assessment:     Chronic right breast pain    Plan:     From a general surgical standpoint I have nothing further to offer. There is no evidence of ongoing infection. I would recommend she see her primary care physician. She may try evening primrose oil et Karie Soda. I am uncertain of when she needs her next mammogram and ultrasound. I will leave this up to Dr.-Luking as well. I will see her back as needed.

## 2010-12-06 LAB — CBC
HCT: 30.2 — ABNORMAL LOW
HCT: 32.1 — ABNORMAL LOW
HCT: 33 — ABNORMAL LOW
Hemoglobin: 10.4 — ABNORMAL LOW
Hemoglobin: 11 — ABNORMAL LOW
Hemoglobin: 11.3 — ABNORMAL LOW
MCHC: 34.3
MCHC: 34.4
MCHC: 34.5
MCV: 76.1 — ABNORMAL LOW
MCV: 76.4 — ABNORMAL LOW
MCV: 76.9 — ABNORMAL LOW
Platelets: 348
Platelets: 364
Platelets: 374
RBC: 3.93
RBC: 4.2
RBC: 4.33
RDW: 13.8
RDW: 14
RDW: 14.2
WBC: 10.9 — ABNORMAL HIGH
WBC: 11.7 — ABNORMAL HIGH
WBC: 16.4 — ABNORMAL HIGH

## 2010-12-06 LAB — COMPREHENSIVE METABOLIC PANEL
ALT: 17
AST: 17
Albumin: 3.1 — ABNORMAL LOW
Alkaline Phosphatase: 64
BUN: 6
CO2: 25
Calcium: 9
Chloride: 110
Creatinine, Ser: 0.74
GFR calc Af Amer: >60
GFR calc non Af Amer: >60
Glucose, Bld: 123 — ABNORMAL HIGH
Potassium: 3.2 — ABNORMAL LOW
Sodium: 138
Total Bilirubin: 0.3
Total Protein: 6.5

## 2010-12-06 LAB — POCT I-STAT 4, (NA,K, GLUC, HGB,HCT)
Glucose, Bld: 100 — ABNORMAL HIGH
HCT: 40
Hemoglobin: 13.6
Operator id: 278131
Potassium: 3.5
Sodium: 138

## 2010-12-06 LAB — DIFFERENTIAL
Basophils Absolute: 0
Basophils Absolute: 0
Basophils Absolute: 0.1
Basophils Relative: 0
Basophils Relative: 0
Basophils Relative: 1
Eosinophils Absolute: 0
Eosinophils Absolute: 0.3
Eosinophils Absolute: 0.3
Eosinophils Relative: 0
Eosinophils Relative: 2
Eosinophils Relative: 3
Lymphocytes Relative: 18
Lymphocytes Relative: 25
Lymphocytes Relative: 9 — ABNORMAL LOW
Lymphs Abs: 1.4
Lymphs Abs: 2
Lymphs Abs: 2.9
Monocytes Absolute: 0.3
Monocytes Absolute: 0.6
Monocytes Absolute: 0.8
Monocytes Relative: 3
Monocytes Relative: 5
Monocytes Relative: 5
Neutro Abs: 14.1 — ABNORMAL HIGH
Neutro Abs: 7.8 — ABNORMAL HIGH
Neutro Abs: 8.3 — ABNORMAL HIGH
Neutrophils Relative %: 67
Neutrophils Relative %: 76
Neutrophils Relative %: 86 — ABNORMAL HIGH

## 2010-12-06 LAB — TYPE AND SCREEN
ABO/RH(D): O POS
Antibody Screen: NEGATIVE

## 2010-12-06 LAB — HCG, SERUM, QUALITATIVE: Preg, Serum: NEGATIVE

## 2010-12-17 ENCOUNTER — Telehealth: Payer: Self-pay | Admitting: Internal Medicine

## 2010-12-17 NOTE — Telephone Encounter (Signed)
Patient has questions about getting B-12 injections for her anemia. Please advise??

## 2010-12-18 NOTE — Telephone Encounter (Signed)
Tried to call pt- LMOM 

## 2010-12-18 NOTE — Telephone Encounter (Signed)
Spoke with pt- she is concerned about her B12 level. Pt has been reading a lot about anemia and feels like she needs it checked. Her last B12 was in January 2012 and it was normal. Informed pt of that and she is still requesting that we check her B12 level because she feels tired all the time.   Please advise.

## 2010-12-19 ENCOUNTER — Other Ambulatory Visit: Payer: Self-pay | Admitting: Internal Medicine

## 2010-12-19 DIAGNOSIS — D649 Anemia, unspecified: Secondary | ICD-10-CM

## 2010-12-19 DIAGNOSIS — R5383 Other fatigue: Secondary | ICD-10-CM

## 2010-12-19 NOTE — Telephone Encounter (Signed)
Pt aware, lab order faxed to lab. 

## 2010-12-19 NOTE — Telephone Encounter (Signed)
May check a b12 level at this time

## 2010-12-20 LAB — VITAMIN B12: Vitamin B-12: 340 pg/mL (ref 211–911)

## 2011-01-08 NOTE — Progress Notes (Signed)
Quick Note:  Pt aware, she asked that we send this to Dr. Gerda Diss for review. ______

## 2011-01-08 NOTE — Progress Notes (Signed)
Results Cc to PCP  

## 2011-04-16 ENCOUNTER — Encounter: Payer: Self-pay | Admitting: Internal Medicine

## 2011-04-18 ENCOUNTER — Encounter: Payer: Self-pay | Admitting: Gastroenterology

## 2011-04-18 ENCOUNTER — Ambulatory Visit: Payer: Medicaid Other | Admitting: Gastroenterology

## 2011-04-18 ENCOUNTER — Ambulatory Visit (INDEPENDENT_AMBULATORY_CARE_PROVIDER_SITE_OTHER): Payer: Medicare Other | Admitting: Gastroenterology

## 2011-04-18 VITALS — BP 110/74 | HR 84 | Temp 97.8°F | Ht 66.0 in | Wt 258.0 lb

## 2011-04-18 DIAGNOSIS — R1319 Other dysphagia: Secondary | ICD-10-CM

## 2011-04-18 DIAGNOSIS — R1314 Dysphagia, pharyngoesophageal phase: Secondary | ICD-10-CM

## 2011-04-18 DIAGNOSIS — R1310 Dysphagia, unspecified: Secondary | ICD-10-CM

## 2011-04-18 HISTORY — DX: Dysphagia, unspecified: R13.10

## 2011-04-18 NOTE — Progress Notes (Signed)
Faxed to PCP

## 2011-04-18 NOTE — Patient Instructions (Signed)
Until your upper endoscopy you need to stick with soft foods. Drink plenty of liquids when taking your medication. Add nutritional supplement drinks like SlimFast, Ensure, [employer] for now.

## 2011-04-18 NOTE — Assessment & Plan Note (Signed)
Progressive solid food esophageal dysphagia now with odynophagia. Unable to consume solid food in three days per her report. Previous EGD 2011 without obvious stricture. EGD 2008 florid GERD. Patient denies typical heartburn. ?symptoms secondary to atypical GERD, candida esophagitis/viral esophagitis, esophageal stricture, ulcerative esophagitis. EGD/ED in near future. Patient has had EGD with conscious sedation and in OR previously. She states her conscious sedation was inadequate and requested deep sedation in OR.  I have discussed the risks, alternatives, benefits with regards to but not limited to the risk of reaction to medication, bleeding, infection, perforation and the patient is agreeable to proceed. Written consent to be obtained.  If EGD/ED unrevealing and/or does not help with dysphagia, she will need w/u for esophageal motility disorder.

## 2011-04-18 NOTE — Progress Notes (Signed)
Primary Care Physician:  Harlow Asa, MD, MD  Primary Gastroenterologist:  Roetta Sessions, MD   Chief Complaint  Patient presents with  . Dysphagia    HPI:  Brenda Richardson is a 31 y.o. female here with c/o worsening swallowing difficulties. She has been evaluated for dysphagia back in 2011. At that time, she had EGD/ED with short-lived improvement of dysphagia. Subsequent follow up BPE was unremarkable.  Patient presents now with c/o food not going down. She states she has not had solid food in three days. Tolerates pills and liquids without difficulty. C/O odynophagia. No heartburn symptoms. Pain in esophagus all the times. Food coming back up when food gets stuck. Symptoms worse in evenings. No blood in emesis. Feels lot of pressure in neck area with eating. When food sticks, she feels like she cannot breathe. BM regular, melena, brbpr, no abd pain. C/O hoarseness. She is pleased with her intentional weight loss.   Current Outpatient Prescriptions  Medication Sig Dispense Refill  . ALPRAZolam (XANAX) 1 MG tablet Take 1 tablet by mouth 2 (two) times daily.       . Blood Glucose Monitoring Suppl (ONE TOUCH ULTRA MINI) W/DEVICE KIT       . gabapentin (NEURONTIN) 100 MG capsule Take 300 mg by mouth at bedtime.       Marland Kitchen HYDROcodone-acetaminophen (NORCO) 7.5-325 MG per tablet Take 1 tablet by mouth every 8 (eight) hours as needed. As needed for back pain but doesn't take regularly.      Letta Pate DELICA LANCETS MISC       . phentermine (ADIPEX-P) 37.5 MG tablet Take 37.5 mg by mouth daily before breakfast.       . Venlafaxine HCl (EFFEXOR XR PO) Take by mouth.       . VERAPAMIL HCL PO Take 240 mg by mouth daily.         Allergies as of 04/18/2011 - Review Complete 04/18/2011  Allergen Reaction Noted  . Ketorolac tromethamine  10/11/2008  . Nalbuphine  10/11/2008  . Tramadol hcl      Past Medical History  Diagnosis Date  . Breast infection 04/04/2010    hospitalized, given antibiotics,  referred to surgeon  . Bipolar disorder   . Vaginitis 04/04/2010  . GERD (gastroesophageal reflux disease)   . Anemia   . Migraine headache   . Hemorrhoids, internal   . Obesity   . HTN (hypertension)     Essential  . Diabetes mellitus   . Colonic adenoma   . Helicobacter pylori (H. pylori)     s/p treatment    Past Surgical History  Procedure Date  . Cholecystectomy   . Tubal ligation   . Appendectomy 2008    lower abd pain  . S/p hysterectomy 08/2007    No Cancer Cells  . Breast reduction surgery 08/2008    from a 44 DDD to a 44 C  . Bilateral salpingoophorectomy 06/2009  . Tumor removal     from left shoulder  . Colonoscopy 01/04/10    tubular adenoma of cecum, normal TI, patchy fibrotic changes involving sigm, desc s/p bx neg. Next TCS 12/2016  . Esophagogastroduodenoscopy 01/04/10    normal esophagus s/p dilation, small hh, H pylori gastritis  . Esophagogastroduodenoscopy 2008    florid gastroesophageal reflux during exam but normal mucosa    Family History  Problem Relation Age of Onset  . Cancer Mother     cervical, deceased age 2  . Cancer Father  lung cancer, deceased age 31    History   Social History  . Marital Status: Married    Spouse Name: N/A    Number of Children: 3  . Years of Education: N/A   Occupational History  .     Social History Main Topics  . Smoking status: Never Smoker   . Smokeless tobacco: Never Used  . Alcohol Use: No  . Drug Use: No  . Sexually Active: Not on file   Other Topics Concern  . Not on file   Social History Narrative  . No narrative on file      ROS:  General: Negative for anorexia, weight loss, fever, chills, fatigue, weakness. Eyes: Negative for vision changes.  ENT: Negative for hoarseness, difficulty swallowing , nasal congestion. CV: Negative for chest pain, angina, palpitations, dyspnea on exertion, peripheral edema.  Respiratory: Negative for dyspnea at rest, dyspnea on exertion, cough,  sputum, wheezing.  GI: See history of present illness. GU:  Negative for dysuria, hematuria, urinary incontinence, urinary frequency, nocturnal urination.  MS: Negative for joint pain, low back pain.  Derm: Negative for rash or itching.  Neuro: Negative for weakness, abnormal sensation, seizure, frequent headaches, memory loss, confusion.  Psych: Negative for anxiety, depression, suicidal ideation, hallucinations.  Endo: Negative for unusual weight change.  Heme: Negative for bruising or bleeding. Allergy: Negative for rash or hives.    Physical Examination:  BP 110/74  Pulse 84  Temp(Src) 97.8 F (36.6 C) (Temporal)  Ht 5\' 6"  (1.676 m)  Wt 258 lb (117.028 kg)  BMI 41.64 kg/m2   General: Well-nourished, well-developed in no acute distress.  Head: Normocephalic, atraumatic.   Eyes: Conjunctiva pink, no icterus. Mouth: Oropharyngeal mucosa moist and pink , no lesions erythema or exudate. Neck: Supple without thyromegaly, masses, or lymphadenopathy.  Lungs: Clear to auscultation bilaterally.  Heart: Regular rate and rhythm, no murmurs rubs or gallops.  Abdomen: Bowel sounds are normal, nontender, nondistended, no hepatosplenomegaly or masses, no abdominal bruits or    hernia , no rebound or guarding.   Rectal: Not performed. Extremities: No lower extremity edema. No clubbing or deformities.  Neuro: Alert and oriented x 4 , grossly normal neurologically.  Skin: Warm and dry, no rash or jaundice.   Psych: Alert and cooperative, normal mood and affect.

## 2011-04-22 ENCOUNTER — Encounter (HOSPITAL_COMMUNITY): Payer: Self-pay | Admitting: Pharmacy Technician

## 2011-04-22 ENCOUNTER — Other Ambulatory Visit: Payer: Self-pay | Admitting: Gastroenterology

## 2011-04-23 ENCOUNTER — Encounter (HOSPITAL_COMMUNITY): Payer: Self-pay

## 2011-04-23 ENCOUNTER — Encounter (HOSPITAL_COMMUNITY)
Admission: RE | Admit: 2011-04-23 | Discharge: 2011-04-23 | Disposition: A | Discharge status: Home / Self Care | Payer: PRIVATE HEALTH INSURANCE | Source: Ambulatory Visit | Attending: Internal Medicine | Admitting: Internal Medicine

## 2011-04-23 HISTORY — DX: Other specified postprocedural states: Z98.890

## 2011-04-23 HISTORY — DX: Adverse effect of unspecified anesthetic, initial encounter: T41.45XA

## 2011-04-23 HISTORY — DX: Anxiety disorder, unspecified: F41.9

## 2011-04-23 HISTORY — DX: Nausea with vomiting, unspecified: R11.2

## 2011-04-23 LAB — BASIC METABOLIC PANEL
BUN: 6 mg/dL (ref 6–23)
CO2: 25 mEq/L (ref 19–32)
Calcium: 9.9 mg/dL (ref 8.4–10.5)
Chloride: 103 mEq/L (ref 96–112)
Creatinine, Ser: 0.69 mg/dL (ref 0.50–1.10)
GFR calc Af Amer: >90 mL/min (ref >90)
GFR calc non Af Amer: >90 mL/min (ref >90)
Glucose, Bld: 165 mg/dL — ABNORMAL HIGH (ref 70–99)
Potassium: 3.5 mEq/L (ref 3.5–5.1)
Sodium: 139 mEq/L (ref 135–145)

## 2011-04-23 LAB — HEMOGLOBIN AND HEMATOCRIT, BLOOD
HCT: 39.6 % (ref 36.0–46.0)
Hemoglobin: 12.7 g/dL (ref 12.0–15.0)

## 2011-04-23 NOTE — Patient Instructions (Addendum)
18 Brenda Richardson  04/23/2011   Your procedure is scheduled on:  04/25/11  Report to [facility] at 07:00 AM.  Call this number if you have problems the morning of surgery: [phone]   Remember:   Do not eat food:After Midnight.  May have clear liquids:until Midnight .  Clear liquids include soda, tea, black coffee, apple or grape juice, broth.  Take these medicines the morning of surgery with A SIP OF WATER: Verapamil, Effexor, Gabapentin and Alprazolam. Take your Hydrocodone only if needed.   Do not wear jewelry, make-up or nail polish.  Do not wear lotions, powders, or perfumes. You may wear deodorant.  Do not shave 48 hours prior to surgery.  Do not bring valuables to the hospital.  Contacts, dentures or bridgework may not be worn into surgery.  Leave suitcase in the car. After surgery it may be brought to your room.  For patients admitted to the hospital, checkout time is 11:00 AM the day of discharge.   Patients discharged the day of surgery will not be allowed to drive home.  Name and phone number of your driver:   Special Instructions: N/A   Please read over the following fact sheets that you were given: Pain Booklet, Anesthesia Post-op Instructions and Care and Recovery After Surgery    Esophagogastroduodenoscopy This is an endoscopic procedure (a procedure that uses a device like a flexible telescope) that allows your caregiver to view the upper stomach and small bowel. This test allows your caregiver to look at the esophagus. The esophagus carries food from your mouth to your stomach. They can also look at your duodenum. This is the first part of the small intestine that attaches to the stomach. This test is used to detect problems in the bowel such as ulcers and inflammation. PREPARATION FOR TEST Nothing to eat after midnight the day before the test. NORMAL FINDINGS Normal esophagus, stomach, and duodenum. Ranges for normal findings may vary among different laboratories  and hospitals. You should always check with your doctor after having lab work or other tests done to discuss the meaning of your test results and whether your values are considered within normal limits. MEANING OF TEST  Your caregiver will go over the test results with you and discuss the importance and meaning of your results, as well as treatment options and the need for additional tests if necessary. OBTAINING THE TEST RESULTS It is your responsibility to obtain your test results. Ask the lab or department performing the test when and how you will get your results. Document Released: 06/28/2004 Document Revised: 11/07/2010 Document Reviewed: 02/05/2008 [facility] Patient Information 2012 [place], [place].   Esophageal Dilatation The esophagus is the long, narrow tube which carries food and liquid from the mouth to the stomach. Esophageal dilatation is the technique used to stretch a blocked or narrowed portion of the esophagus. This procedure is used when a part of the esophagus has become so narrow that it becomes difficult, painful or even impossible to swallow. This is generally an uncomplicated form of treatment. When this is not successful, chest surgery may be required. This is a much more extensive form of treatment with a longer recovery time. CAUSES  Some of the more common causes of blockage or strictures of the esophagus are:  Narrowing from longstanding inflammation (soreness and redness) of the lower esophagus. This comes from the constant exposure of the lower esophagus to the acid which bubbles up from the stomach. Over time this causes scarring  and narrowing of the lower esophagus.   Hiatal hernia in which a small part of the stomach bulges (herniates) up through the diaphragm. This can cause a gradual narrowing of the end of the esophagus.   Schatzki's Ring is a narrow ring of benign (non-cancerous) fibrous tissue which constricts the lower esophagus. The reason for this is not  known.   Scleroderma is a connective tissue disorder that affects the esophagus and makes swallowing difficult.   Achalasia is an absence of nerves to the lower esophagus and to the esophageal sphincter. This is the circular muscle between the stomach and esophagus that relaxes to allow food into the stomach. After swallowing, it contracts to keep food in the stomach. This absence of nerves may be congenital (present since birth). This can cause irregular spasms of the lower esophageal muscle. This spasm does not open up to allow food and fluid through. The result is a persistent blockage with subsequent slow trickling of the esophageal contents into the stomach.   Strictures may develop from swallowing materials which damage the esophagus. Some examples are strong acids or alkalis such as lye.   Growths such as benign (non-cancerous) and malignant (cancerous) tumors can block the esophagus.   Heredity (present since birth) causes.  DIAGNOSIS  Your caregiver often suspects this problem by taking a medical history. They will also do a physical exam. They can then prove their suspicions using X-rays and endoscopy. Endoscopy is an exam in which a tube like a small flexible telescope is used to look at your esophagus.  TREATMENT There are different stretching (dilating) techniques which can be used. Simple bougie dilatation may be done in the office. This usually takes only a couple minutes. A numbing (anesthetic) spray of the throat is used. Endoscopy, when done, is done in an endoscopy suite, under mild sedation. When fluoroscopy is used, the procedure is performed in X-ray. Other techniques require a little longer time. Recovery is usually quick. There is no waiting time to begin eating and drinking to test success of the treatment. Following are some of the methods used. Narrowing of the esophagus is treated by making it bigger. Commonly this is a mechanical problem which can be treated with  stretching. This can be done in different ways. Your caregiver will discuss these with you. Some of the means used are:  A series of graduated (increasing thickness) flexible dilators can be used. These are weighted tubes passed through the esophagus into the stomach. The tubes used become progressively larger until the desired stretched size is reached. Graduated dilators are a simple and quick way of opening the esophagus. No visualization is required.   Another method is the use of endoscopy to place a flexible wire across the stricture. The endoscope is removed and the wire left in place. A dilator with a hole through it from end to end is guided down the esophagus and across the stricture. One or more of these dilators are passed over the wire. At the end of the exam, the wire is removed. This type of treatment may be performed in the X-ray department under fluoroscopy. An advantage of this procedure is the examiner is visualizing the end opening in the esophagus.   Stretching of the esophagus may be done using balloons. Deflated balloons are placed through the endoscope and across the stricture. This type of balloon dilatation is often done at the time of endoscopy or fluoroscopy. Flexible endoscopy allows the examiner to directly view the stricture. A  balloon is inserted in the deflated form into the area of narrowing. It is then inflated with air to a certain pressure that is pre-set for a given circumference. When inflated, it becomes sausage shaped, stretched, and makes the stricture larger.   Achalasia requires a longer larger balloon-type dilator. This is frequently done under X-ray control. In this situation, the spastic muscle fibers in the lower esophagus are stretched.  All of the above procedures make the passage of food and water into the stomach easier. They also make it easier for stomach contents to reflux back into the esophagus. Special medications may be used following the procedure  to help prevent further stricturing. Proton-pump inhibitor medications are good at decreasing the amount of acid in the stomach juice. When stomach juice refluxes into the esophagus, the juice is no longer as acidic and is less likely to burn or scar the esophagus. RISKS AND COMPLICATIONS Esophageal dilatation is usually performed effectively and without problems. Some complications that can occur are:  A small amount of bleeding almost always happens where the stretching takes place. If this is too excessive it may require more aggressive treatment.   An uncommon complication is perforation (making a hole) of the esophagus. The esophagus is thin. It is easy to make a hole in it. If this happens, an operation may be necessary to repair this.   A small, undetected perforation could lead to an infection in the chest. This can be very serious.  HOME CARE INSTRUCTIONS   If you received sedation for your procedure, do not drive, make important decisions, or perform any activities requiring your full coordination. Do not drink alcohol, take sedatives, or use any mind altering chemicals unless instructed by your caregiver.   You may use throat lozenges or warm salt water gargles if you have throat discomfort   You can begin eating and drinking normally on return home unless instructed otherwise. Do not purposely try to force large chunks of food down to test the benefits of your procedure.   Mild discomfort can be eased with sips of ice water.   Medications for discomfort may or may not be needed.  SEEK IMMEDIATE MEDICAL CARE IF:   You begin vomiting up blood.   You develop black tarry stools   You develop chills or an unexplained temperature of over 101 F (38.3 C)   You develop chest or abdominal pain.   You develop shortness of breath or feel lightheaded or faint.   Your swallowing is becoming more painful, difficult, or you are unable to swallow.  MAKE SURE YOU:   Understand these  instructions.   Will watch your condition.   Will get help right away if you are not doing well or get worse.  Document Released: 04/18/2005 Document Revised: 11/07/2010 Document Reviewed: 06/05/2005 [facility] Patient Information 2012 [place], [place].   PATIENT INSTRUCTIONS POST-ANESTHESIA  IMMEDIATELY FOLLOWING SURGERY:  Do not drive or operate machinery for the first twenty four hours after surgery.  Do not make any important decisions for twenty four hours after surgery or while taking narcotic pain medications or sedatives.  If you develop intractable nausea and vomiting or a severe headache please notify your doctor immediately.  FOLLOW-UP:  Please make an appointment with your surgeon as instructed. You do not need to follow up with anesthesia unless specifically instructed to do so.  WOUND CARE INSTRUCTIONS (if applicable):  Keep a dry clean dressing on the anesthesia/puncture wound site if there is drainage.  Once  the wound has quit draining you may leave it open to air.  Generally you should leave the bandage intact for twenty four hours unless there is drainage.  If the epidural site drains for more than 36-48 hours please call the anesthesia department.  QUESTIONS?:  Please feel free to call your physician or the hospital operator if you have any questions, and they will be happy to assist you.     [facility] Anesthesia Department [address] [place] [phone]

## 2011-04-25 ENCOUNTER — Encounter (HOSPITAL_COMMUNITY)
Admission: RE | Disposition: A | Discharge status: Home / Self Care | Payer: Self-pay | Source: Ambulatory Visit | Attending: Internal Medicine

## 2011-04-25 ENCOUNTER — Ambulatory Visit (HOSPITAL_COMMUNITY): Payer: PRIVATE HEALTH INSURANCE | Admitting: Anesthesiology

## 2011-04-25 ENCOUNTER — Encounter (HOSPITAL_COMMUNITY): Payer: Self-pay | Admitting: *Deleted

## 2011-04-25 ENCOUNTER — Ambulatory Visit (HOSPITAL_COMMUNITY)
Admission: RE | Admit: 2011-04-25 | Discharge: 2011-04-25 | Disposition: A | Discharge status: Home / Self Care | Payer: PRIVATE HEALTH INSURANCE | Source: Ambulatory Visit | Attending: Internal Medicine | Admitting: Internal Medicine

## 2011-04-25 ENCOUNTER — Encounter (HOSPITAL_COMMUNITY): Payer: Self-pay | Admitting: Anesthesiology

## 2011-04-25 DIAGNOSIS — K3189 Other diseases of stomach and duodenum: Secondary | ICD-10-CM

## 2011-04-25 DIAGNOSIS — R1013 Epigastric pain: Secondary | ICD-10-CM

## 2011-04-25 DIAGNOSIS — E119 Type 2 diabetes mellitus without complications: Secondary | ICD-10-CM | POA: Insufficient documentation

## 2011-04-25 DIAGNOSIS — Z01812 Encounter for preprocedural laboratory examination: Secondary | ICD-10-CM | POA: Insufficient documentation

## 2011-04-25 DIAGNOSIS — Z79899 Other long term (current) drug therapy: Secondary | ICD-10-CM | POA: Insufficient documentation

## 2011-04-25 DIAGNOSIS — R1310 Dysphagia, unspecified: Secondary | ICD-10-CM

## 2011-04-25 DIAGNOSIS — I10 Essential (primary) hypertension: Secondary | ICD-10-CM | POA: Insufficient documentation

## 2011-04-25 LAB — GLUCOSE, CAPILLARY: Glucose-Capillary: 129 mg/dL — ABNORMAL HIGH (ref 70–99)

## 2011-04-25 SURGERY — ESOPHAGOGASTRODUODENOSCOPY (EGD) WITH PROPOFOL
Anesthesia: Monitor Anesthesia Care

## 2011-04-25 MED ORDER — BUTAMBEN-TETRACAINE-BENZOCAINE 2-2-14 % EX AERO
1.0000 | INHALATION_SPRAY | Freq: Once | CUTANEOUS | Status: AC
  Administered 2011-04-25: 1 via TOPICAL
  Filled 2011-04-25: qty 56

## 2011-04-25 MED ORDER — SIMETHICONE 40 MG/0.6ML PO SUSP
ORAL | Status: DC | PRN
  Administered 2011-04-25: 09:00:00

## 2011-04-25 MED ORDER — GLYCOPYRROLATE 0.2 MG/ML IJ SOLN
0.2000 mg | Freq: Once | INTRAMUSCULAR | Status: AC
  Administered 2011-04-25: 0.2 mg via INTRAVENOUS

## 2011-04-25 MED ORDER — LACTATED RINGERS IV SOLN
INTRAVENOUS | Status: DC
  Administered 2011-04-25: 08:00:00 via INTRAVENOUS

## 2011-04-25 MED ORDER — PROPOFOL 10 MG/ML IV EMUL
INTRAVENOUS | Status: AC
  Filled 2011-04-25: qty 20

## 2011-04-25 MED ORDER — FENTANYL CITRATE 0.05 MG/ML IJ SOLN
INTRAMUSCULAR | Status: AC
  Filled 2011-04-25: qty 2

## 2011-04-25 MED ORDER — MIDAZOLAM HCL 2 MG/2ML IJ SOLN
INTRAMUSCULAR | Status: AC
  Administered 2011-04-25: 2 mg via INTRAVENOUS
  Filled 2011-04-25: qty 2

## 2011-04-25 MED ORDER — LIDOCAINE HCL (PF) 1 % IJ SOLN
INTRAMUSCULAR | Status: AC
  Filled 2011-04-25: qty 5

## 2011-04-25 MED ORDER — MIDAZOLAM HCL 5 MG/5ML IJ SOLN
INTRAMUSCULAR | Status: DC | PRN
  Administered 2011-04-25: 2 mg via INTRAVENOUS

## 2011-04-25 MED ORDER — FENTANYL CITRATE 0.05 MG/ML IJ SOLN: 25.0000 ug | INTRAMUSCULAR | Status: DC | PRN

## 2011-04-25 MED ORDER — MIDAZOLAM HCL 2 MG/2ML IJ SOLN
1.0000 mg | INTRAMUSCULAR | Status: DC | PRN
  Administered 2011-04-25: 2 mg via INTRAVENOUS

## 2011-04-25 MED ORDER — PROPOFOL 10 MG/ML IV EMUL
INTRAVENOUS | Status: DC | PRN
  Administered 2011-04-25: 85 ug/kg/min via INTRAVENOUS

## 2011-04-25 MED ORDER — LIDOCAINE HCL 1 % IJ SOLN
INTRAMUSCULAR | Status: DC | PRN
  Administered 2011-04-25: 40 mg via INTRADERMAL

## 2011-04-25 MED ORDER — MIDAZOLAM HCL 2 MG/2ML IJ SOLN
INTRAMUSCULAR | Status: AC
  Filled 2011-04-25: qty 2

## 2011-04-25 MED ORDER — GLYCOPYRROLATE 0.2 MG/ML IJ SOLN
INTRAMUSCULAR | Status: AC
  Administered 2011-04-25: 0.2 mg via INTRAVENOUS
  Filled 2011-04-25: qty 1

## 2011-04-25 SURGICAL SUPPLY — 18 items
BLOCK BITE 60FR ADLT L/F BLUE (MISCELLANEOUS) ×2 IMPLANT
DEVICE CLIP HEMOSTAT 235CM (CLIP) IMPLANT
ELECT REM PT RETURN 9FT ADLT (ELECTROSURGICAL)
ELECTRODE REM PT RTRN 9FT ADLT (ELECTROSURGICAL) IMPLANT
FLOOR PAD 36X40 (MISCELLANEOUS) ×2
FORCEP RJ3 GP 1.8X160 W-NEEDLE (CUTTING FORCEPS) ×1 IMPLANT
FORCEPS BIOP RAD 4 LRG CAP 4 (CUTTING FORCEPS) ×1 IMPLANT
MANIFOLD NEPTUNE WASTE (CANNULA) ×2 IMPLANT
NEEDLE SCLEROTHERAPY 25GX240 (NEEDLE) IMPLANT
PAD FLOOR 36X40 (MISCELLANEOUS) ×1 IMPLANT
PROBE APC STR FIRE (PROBE) IMPLANT
PROBE INJECTION GOLD (MISCELLANEOUS)
PROBE INJECTION GOLD 7FR (MISCELLANEOUS) IMPLANT
SNARE ROTATE MED OVAL 20MM (MISCELLANEOUS) ×1 IMPLANT
SYR 50ML LL SCALE MARK (SYRINGE) ×1 IMPLANT
TUBING ENDO SMARTCAP PENTAX (MISCELLANEOUS) ×2 IMPLANT
TUBING IRRIGATION ENDOGATOR (MISCELLANEOUS) ×2 IMPLANT
WATER STERILE IRR 1000ML POUR (IV SOLUTION) ×1 IMPLANT

## 2011-04-25 NOTE — Interval H&P Note (Signed)
History and Physical Interval Note:  04/25/2011 8:26 AM  Brenda Richardson  has presented today for surgery, with the diagnosis of dysphagia  The various methods of treatment have been discussed with the patient and family. After consideration of risks, benefits and other options for treatment, the patient has consented to  Procedure(s) (LRB): ESOPHAGOGASTRODUODENOSCOPY (EGD) WITH PROPOFOL (N/A) SAVORY DILATION (N/A) MALONEY DILATION (N/A) as a surgical intervention .  The patients' history has been reviewed, patient examined, no change in status, stable for surgery.  I have reviewed the patients' chart and labs.  Questions were answered to the patient's satisfaction.     [name]

## 2011-04-25 NOTE — Discharge Instructions (Signed)
EGD Discharge instructions Please read the instructions outlined below and refer to this sheet in the next few weeks. These discharge instructions provide you with general information on caring for yourself after you leave the hospital. Your doctor may also give you specific instructions. While your treatment has been planned according to the most current medical practices available, unavoidable complications occasionally occur. If you have any problems or questions after discharge, please call your doctor. ACTIVITY  You may resume your regular activity but move at a slower pace for the next 24 hours.   Take frequent rest periods for the next 24 hours.   Walking will help expel (get rid of) the air and reduce the bloated feeling in your abdomen.   No driving for 24 hours (because of the anesthesia (medicine) used during the test).   You may shower.   Do not sign any important legal documents or operate any machinery for 24 hours (because of the anesthesia used during the test).  NUTRITION  Drink plenty of fluids.   You may resume your normal diet.   Begin with a light meal and progress to your normal diet.   Avoid alcoholic beverages for 24 hours or as instructed by your caregiver.  MEDICATIONS  You may resume your normal medications unless your caregiver tells you otherwise.  WHAT YOU CAN EXPECT TODAY  You may experience abdominal discomfort such as a feeling of fullness or "gas" pains.  FOLLOW-UP  Your doctor will discuss the results of your test with you.  SEEK IMMEDIATE MEDICAL ATTENTION IF ANY OF THE FOLLOWING OCCUR:  Excessive nausea (feeling sick to your stomach) and/or vomiting.   Severe abdominal pain and distention (swelling).   Trouble swallowing.   Temperature over 101 F (37.8 C).   Rectal bleeding or vomiting of blood.    My office will schedule an esophageal manometry in the near future to check the muscles in your esophagus.Marland Kitchen

## 2011-04-25 NOTE — Transfer of Care (Signed)
Immediate Anesthesia Transfer of Care Note  Patient: Brenda Richardson  Procedure(s) Performed: Procedure(s) (LRB): ESOPHAGOGASTRODUODENOSCOPY (EGD) WITH PROPOFOL (N/A)  Patient Location: PACU  Anesthesia Type: MAC  Level of Consciousness: awake and patient cooperative  Airway & Oxygen Therapy: Patient Spontanous Breathing and Patient connected to face mask oxygen  Post-op Assessment: Report given to PACU RN, Post -op Vital signs reviewed and stable and Patient moving all extremities  Post vital signs: Reviewed and stable  Complications: No apparent anesthesia complications

## 2011-04-25 NOTE — Progress Notes (Signed)
Arousing. Swallowing without difficulty.

## 2011-04-25 NOTE — Anesthesia Preprocedure Evaluation (Addendum)
Anesthesia Evaluation  Patient identified by MRN, date of birth, ID band Patient awake    Reviewed: Allergy & Precautions, H&P , NPO status , Patient's Chart, lab work & pertinent test results  History of Anesthesia Complications (+) PONV  Airway Mallampati: III  Neck ROM: Full    Dental  (+) Teeth Intact   Pulmonary    + decreased breath sounds      Cardiovascular hypertension, Regular Normal    Neuro/Psych  Headaches, PSYCHIATRIC DISORDERS Anxiety Depression Bipolar Disorder    GI/Hepatic GERD- (dysphagia)  Medicated and Controlled,  Endo/Other  Diabetes mellitus-, Well Controlled, Type 2, Oral Hypoglycemic AgentsMorbid obesity  Renal/GU      Musculoskeletal   Abdominal (+) obese,   Peds  Hematology   Anesthesia Other Findings   Reproductive/Obstetrics                           Anesthesia Physical Anesthesia Plan  ASA: III  Anesthesia Plan: MAC   Post-op Pain Management:    Induction: Intravenous  Airway Management Planned: Simple Face Mask  Additional Equipment:   Intra-op Plan:   Post-operative Plan:   Informed Consent: I have reviewed the patients History and Physical, chart, labs and discussed the procedure including the risks, benefits and alternatives for the proposed anesthesia with the patient or authorized representative who has indicated his/her understanding and acceptance.     Plan Discussed with:   Anesthesia Plan Comments:         Anesthesia Quick Evaluation

## 2011-04-25 NOTE — Anesthesia Postprocedure Evaluation (Signed)
  Anesthesia Post-op Note  Patient: Brenda Richardson  Procedure(s) Performed: Procedure(s) (LRB): ESOPHAGOGASTRODUODENOSCOPY (EGD) WITH PROPOFOL (N/A)  Patient Location: PACU  Anesthesia Type: MAC  Level of Consciousness: awake, alert , oriented and patient cooperative  Airway and Oxygen Therapy: Patient Spontanous Breathing  Post-op Pain: none  Post-op Assessment: Post-op Vital signs reviewed, Patient's Cardiovascular Status Stable, Respiratory Function Stable, Patent Airway and No signs of Nausea or vomiting  Post-op Vital Signs: Reviewed and stable  Complications: No apparent anesthesia complications

## 2011-04-25 NOTE — H&P (View-Only) (Signed)
Primary Care Physician:  LUKING,W S, MD, MD  Primary Gastroenterologist:  Michael Rourk, MD   Chief Complaint  Patient presents with  . Dysphagia    HPI:  Brenda Richardson is a 31 y.o. female here with c/o worsening swallowing difficulties. She has been evaluated for dysphagia back in 2011. At that time, she had EGD/ED with short-lived improvement of dysphagia. Subsequent follow up BPE was unremarkable.  Patient presents now with c/o food not going down. She states she has not had solid food in three days. Tolerates pills and liquids without difficulty. C/O odynophagia. No heartburn symptoms. Pain in esophagus all the times. Food coming back up when food gets stuck. Symptoms worse in evenings. No blood in emesis. Feels lot of pressure in neck area with eating. When food sticks, she feels like she cannot breathe. BM regular, melena, brbpr, no abd pain. C/O hoarseness. She is pleased with her intentional weight loss.   Current Outpatient Prescriptions  Medication Sig Dispense Refill  . ALPRAZolam (XANAX) 1 MG tablet Take 1 tablet by mouth 2 (two) times daily.       . Blood Glucose Monitoring Suppl (ONE TOUCH ULTRA MINI) W/DEVICE KIT       . gabapentin (NEURONTIN) 100 MG capsule Take 300 mg by mouth at bedtime.       . HYDROcodone-acetaminophen (NORCO) 7.5-325 MG per tablet Take 1 tablet by mouth every 8 (eight) hours as needed. As needed for back pain but doesn't take regularly.      . ONETOUCH DELICA LANCETS MISC       . phentermine (ADIPEX-P) 37.5 MG tablet Take 37.5 mg by mouth daily before breakfast.       . Venlafaxine HCl (EFFEXOR XR PO) Take by mouth.       . VERAPAMIL HCL PO Take 240 mg by mouth daily.         Allergies as of 04/18/2011 - Review Complete 04/18/2011  Allergen Reaction Noted  . Ketorolac tromethamine  10/11/2008  . Nalbuphine  10/11/2008  . Tramadol hcl      Past Medical History  Diagnosis Date  . Breast infection 04/04/2010    hospitalized, given antibiotics,  referred to surgeon  . Bipolar disorder   . Vaginitis 04/04/2010  . GERD (gastroesophageal reflux disease)   . Anemia   . Migraine headache   . Hemorrhoids, internal   . Obesity   . HTN (hypertension)     Essential  . Diabetes mellitus   . Colonic adenoma   . Helicobacter pylori (H. pylori)     s/p treatment    Past Surgical History  Procedure Date  . Cholecystectomy   . Tubal ligation   . Appendectomy 2008    lower abd pain  . S/p hysterectomy 08/2007    No Cancer Cells  . Breast reduction surgery 08/2008    from a 44 DDD to a 44 C  . Bilateral salpingoophorectomy 06/2009  . Tumor removal     from left shoulder  . Colonoscopy 01/04/10    tubular adenoma of cecum, normal TI, patchy fibrotic changes involving sigm, desc s/p bx neg. Next TCS 12/2016  . Esophagogastroduodenoscopy 01/04/10    normal esophagus s/p dilation, small hh, H pylori gastritis  . Esophagogastroduodenoscopy 2008    florid gastroesophageal reflux during exam but normal mucosa    Family History  Problem Relation Age of Onset  . Cancer Mother     cervical, deceased age 29  . Cancer Father       lung cancer, deceased age 45    History   Social History  . Marital Status: Married    Spouse Name: N/A    Number of Children: 3  . Years of Education: N/A   Occupational History  .     Social History Main Topics  . Smoking status: Never Smoker   . Smokeless tobacco: Never Used  . Alcohol Use: No  . Drug Use: No  . Sexually Active: Not on file   Other Topics Concern  . Not on file   Social History Narrative  . No narrative on file      ROS:  General: Negative for anorexia, weight loss, fever, chills, fatigue, weakness. Eyes: Negative for vision changes.  ENT: Negative for hoarseness, difficulty swallowing , nasal congestion. CV: Negative for chest pain, angina, palpitations, dyspnea on exertion, peripheral edema.  Respiratory: Negative for dyspnea at rest, dyspnea on exertion, cough,  sputum, wheezing.  GI: See history of present illness. GU:  Negative for dysuria, hematuria, urinary incontinence, urinary frequency, nocturnal urination.  MS: Negative for joint pain, low back pain.  Derm: Negative for rash or itching.  Neuro: Negative for weakness, abnormal sensation, seizure, frequent headaches, memory loss, confusion.  Psych: Negative for anxiety, depression, suicidal ideation, hallucinations.  Endo: Negative for unusual weight change.  Heme: Negative for bruising or bleeding. Allergy: Negative for rash or hives.    Physical Examination:  BP 110/74  Pulse 84  Temp(Src) 97.8 F (36.6 C) (Temporal)  Ht 5' 6" (1.676 m)  Wt 258 lb (117.028 kg)  BMI 41.64 kg/m2   General: Well-nourished, well-developed in no acute distress.  Head: Normocephalic, atraumatic.   Eyes: Conjunctiva pink, no icterus. Mouth: Oropharyngeal mucosa moist and pink , no lesions erythema or exudate. Neck: Supple without thyromegaly, masses, or lymphadenopathy.  Lungs: Clear to auscultation bilaterally.  Heart: Regular rate and rhythm, no murmurs rubs or gallops.  Abdomen: Bowel sounds are normal, nontender, nondistended, no hepatosplenomegaly or masses, no abdominal bruits or    hernia , no rebound or guarding.   Rectal: Not performed. Extremities: No lower extremity edema. No clubbing or deformities.  Neuro: Alert and oriented x 4 , grossly normal neurologically.  Skin: Warm and dry, no rash or jaundice.   Psych: Alert and cooperative, normal mood and affect.  

## 2011-04-25 NOTE — Op Note (Signed)
[facility] [address] [place], [place]  [address]  ENDOSCOPY PROCEDURE REPORT  PATIENT:  Brenda, Richardson  MR#:  [id number] BIRTHDATE:  [birth year], 30 yrs. old  GENDER:  female  ENDOSCOPIST:  R. Roetta Sessions, MD Caleen Essex Referred by:  Simone Curia, M.D.  PROCEDURE DATE:  04/25/2011 PROCEDURE:  Diagnostic EGD  INDICATIONS:   recurrent esophageal dysphagia .  Negative BPE previously  INFORMED CONSENT:   The risks, benefits, limitations, alternatives and imponderables have been discussed.  The potential for biopsy, esophogeal dilation, etc. have also been reviewed.  Questions have been answered.  All parties agreeable.  Please see the history and physical in the medical record for more information.  MEDICATIONS:      Deep sedation per Dr. [facility]  DESCRIPTION OF PROCEDURE:   The  endoscope was introduced through the mouth and advanced to the second portion of the duodenum without difficulty or limitations.  The mucosal surfaces were surveyed very carefully during advancement of the scope and upon withdrawal.  Retroflexion view of the proximal stomach and esophagogastric junction was performed.  <<PROCEDUREIMAGES>>  FINDINGS:  Normal appearing, widely patent tubular esophagus. Retained gastric contents precluded complete examination of the gastric mucosa and esophageal dilation. Patent pylorus normal first and second portion of the duodenum.  THERAPEUTIC / DIAGNOSTIC MANEUVERS PERFORMED: None  COMPLICATIONS:   None  IMPRESSION:     Normal, patent tubular esophagus. Retained gastric contents precluded complete examination and empiric dilation. Patient's last       intake of solid food 1800 yesterday. This exam was performed at approximately 08 45 today. She likely has an element of       delayed gastric emptying in a setting of narcotic use. However, that would not explain her ongoing dysphagia.  RECOMMENDATIONS:    We'll proceed with an  esophageal manometry to evaluate this nice lady further for a potential underlying esophageal          motility disorder.  ______________________________ R. Roetta Sessions, MD Caleen Essex  CC:  n. eSIGNED:   R. Roetta Sessions at 04/25/2011 09:09 AM  Bibi Eck, [phone]

## 2011-05-07 ENCOUNTER — Telehealth: Payer: Self-pay

## 2011-05-07 NOTE — Telephone Encounter (Signed)
Pt called- stated she was having some pain and hoarseness in her throat. She had procedure done on 2/14. She has tried tylenol and ibuprofen and they are not helping. Pt is requesting something for pain called in to the [facility]. Please advise.

## 2011-05-08 NOTE — Telephone Encounter (Signed)
Use Chloraseptic spray. I do not feel a prescription is needed. Has esophageal manometry been scheduled?

## 2011-05-08 NOTE — Telephone Encounter (Signed)
Pt aware.  Crystal, is pt scheduled yet?

## 2011-05-09 NOTE — Telephone Encounter (Signed)
Referral faxed to [facility] 

## 2011-05-15 ENCOUNTER — Telehealth: Payer: Self-pay | Admitting: Gastroenterology

## 2011-05-15 NOTE — Telephone Encounter (Signed)
Pt was referred to [facility] for Esophageal Manometry- per Clifton Custard they attempted to contact pt on 03/05- no answer- will keep trying

## 2011-05-30 ENCOUNTER — Telehealth: Payer: Self-pay | Admitting: Gastroenterology

## 2011-05-30 NOTE — Telephone Encounter (Signed)
Brenda Richardson left a message on my VM regarding her referral to [facility]- She said they contacted her in regards to scheduling an esophageal manometry but after finding out what the procedure would in tell, Brenda Assad does not want to have this done. She stated over and over that there is no way she could tolerate bing awake to have this done. [facility] advised her to contact us to see if there is something else we would like to try besides the manometry. Please advise

## 2011-05-30 NOTE — Telephone Encounter (Signed)
I called Brenda Richardson back- advised her that RMR recommended the manometry but it was up to her if she wanted to have it or not- Pt stated she would proceed with the procedure- I called [facility]- spoke with Victorino Dike, they will contact the pt to reschedule

## 2011-05-30 NOTE — Telephone Encounter (Signed)
Esophageal manometry is what is recommended. If patient does not want to have it done, that's her choice.Marland Kitchen

## 2011-05-30 NOTE — Telephone Encounter (Signed)
Ordered by RMR.

## 2011-07-28 ENCOUNTER — Emergency Department (HOSPITAL_COMMUNITY): Payer: PRIVATE HEALTH INSURANCE

## 2011-07-28 ENCOUNTER — Emergency Department (HOSPITAL_COMMUNITY)
Admission: EM | Admit: 2011-07-28 | Discharge: 2011-07-28 | Discharge status: Against Medical Advice | Payer: PRIVATE HEALTH INSURANCE | Attending: Emergency Medicine | Admitting: Emergency Medicine

## 2011-07-28 ENCOUNTER — Encounter (HOSPITAL_COMMUNITY): Payer: Self-pay | Admitting: *Deleted

## 2011-07-28 DIAGNOSIS — G43909 Migraine, unspecified, not intractable, without status migrainosus: Secondary | ICD-10-CM | POA: Insufficient documentation

## 2011-07-28 DIAGNOSIS — G8929 Other chronic pain: Secondary | ICD-10-CM | POA: Insufficient documentation

## 2011-07-28 DIAGNOSIS — M25561 Pain in right knee: Secondary | ICD-10-CM

## 2011-07-28 DIAGNOSIS — F319 Bipolar disorder, unspecified: Secondary | ICD-10-CM | POA: Insufficient documentation

## 2011-07-28 DIAGNOSIS — M25569 Pain in unspecified knee: Secondary | ICD-10-CM | POA: Insufficient documentation

## 2011-07-28 DIAGNOSIS — E119 Type 2 diabetes mellitus without complications: Secondary | ICD-10-CM | POA: Insufficient documentation

## 2011-07-28 DIAGNOSIS — Z79899 Other long term (current) drug therapy: Secondary | ICD-10-CM | POA: Insufficient documentation

## 2011-07-28 HISTORY — DX: Other chronic pain: G89.29

## 2011-07-28 HISTORY — DX: Diverticulitis of intestine, part unspecified, without perforation or abscess without bleeding: K57.92

## 2011-07-28 MED ORDER — OXYCODONE-ACETAMINOPHEN 5-325 MG PO TABS
2.0000 | ORAL_TABLET | Freq: Once | ORAL | Status: AC
  Administered 2011-07-28: 2 via ORAL
  Filled 2011-07-28: qty 2

## 2011-07-28 NOTE — ED Notes (Signed)
Pt c/o headache that started two days ago, became worse this afternoon when headache was associated with dizzy, nausea, weakness, pt also c/o bilateral knee pain.

## 2011-07-28 NOTE — ED Notes (Signed)
In to check on patient. Patient not in room. RN called x ray to see if patient was in imaging. X Ray reports that patient refused x ray until she had her pain medication. Patient was not visualized leaving ED and she did not let staff know she was leaving. Dr Clarene Duke made aware.

## 2011-07-28 NOTE — ED Provider Notes (Signed)
History     CSN: [id number]  Arrival date & time 07/28/11  1257   First MD Initiated Contact with Patient 07/28/11 1347      Chief Complaint  Patient presents with  . Migraine  . Dizziness  . Knee Pain    HPI Pt was seen at 1400.  Per pt, c/o gradual onset and persistence of constant acute flair of her chronic migraine headache for the past 2-3 days.  Describes the headache as per her usual chronic migraine headache pain pattern for the past several years. Has been assoc with nausea, generalized weakness, runny/stuffy nose and sinus congestion. Denies headache was sudden or maximal in onset or at any time.  Denies visual changes, no focal motor weakness, no tingling/numbness in extremities, no fevers, no neck pain, no rash.  Pt also c/o gradual onset and persistence of constant bilat knees "pain" that began several days ago after she "fell down on them."  Describes the pain as "they hurt."  Pt has been ambulatory since the incident.  Denies deformity, no open wounds, no other injuries.     Past Medical History  Diagnosis Date  . Breast infection 04/04/2010    hospitalized, given antibiotics, referred to surgeon  . Bipolar disorder   . Vaginitis 04/04/2010  . Anemia   . Migraine headache   . Hemorrhoids, internal   . Obesity   . Diabetes mellitus   . Colonic adenoma   . Helicobacter pylori (H. pylori)     s/p treatment  . Anxiety   . Complication of anesthesia   . PONV (postoperative nausea and vomiting)   . Diverticulitis   . Chronic pain     Past Surgical History  Procedure Date  . Cholecystectomy   . Tubal ligation   . Appendectomy 2008    lower abd pain  . S/p hysterectomy 08/2007    No Cancer Cells  . Breast reduction surgery 08/2008    from a 44 DDD to a 44 C  . Bilateral salpingoophorectomy 06/2009  . Tumor removal     from left shoulder  . Colonoscopy 01/04/10    tubular adenoma of cecum, normal TI, patchy fibrotic changes involving sigm, desc s/p bx neg.  Next TCS 12/2016  . Esophagogastroduodenoscopy 01/04/10    normal esophagus s/p dilation, small hh, H pylori gastritis  . Esophagogastroduodenoscopy 2008    florid gastroesophageal reflux during exam but normal mucosa  . Abdominal hysterectomy   . Wisdom tooth extraction     Family History  Problem Relation Age of Onset  . Cancer Mother     cervical, deceased age 64  . Cancer Father     lung cancer, deceased age 11  . Anesthesia problems Neg Hx   . Hypotension Neg Hx   . Malignant hyperthermia Neg Hx   . Pseudochol deficiency Neg Hx     History  Substance Use Topics  . Smoking status: Never Smoker   . Smokeless tobacco: Never Used  . Alcohol Use: No    Review of Systems ROS: Statement: All systems negative except as marked or noted in the HPI; Constitutional: Negative for fever and chills. ; ; Eyes: Negative for eye pain, redness and discharge. ; ; ENMT: Negative for ear pain, hoarseness, sore throat. +nasal congestion, sinus pressure, rhinorrhea. ; ; Cardiovascular: Negative for chest pain, palpitations, diaphoresis, dyspnea and peripheral edema. ; ; Respiratory: Negative for cough, wheezing and stridor. ; ; Gastrointestinal: Negative for nausea, vomiting, diarrhea, abdominal pain, blood in stool,  hematemesis, jaundice and rectal bleeding. . ; ; Genitourinary: Negative for dysuria, flank pain and hematuria. ; ; Musculoskeletal: +knees pain. Negative for back pain and neck pain. Negative for swelling.; ; Skin: Negative for pruritus, rash, abrasions, blisters, bruising and skin lesion.; ; Neuro: +frontal headache. Negative for lightheadedness and neck stiffness. Negative for weakness, altered level of consciousness , altered mental status, extremity weakness, paresthesias, involuntary movement, seizure and syncope.     Allergies  Tomato; Ketorolac tromethamine; Nalbuphine; Tramadol hcl; and Zofran  Home Medications   Current Outpatient Rx  Name Route Sig Dispense Refill  .  ALPRAZOLAM 1 MG PO TABS Oral Take 1 tablet by mouth 2 (two) times daily.     Marland Kitchen HYDROCODONE-ACETAMINOPHEN 7.5-325 MG PO TABS Oral Take 1 tablet by mouth every 8 (eight) hours as needed. As needed for back pain but doesn't take regularly.    . VENLAFAXINE HCL 75 MG PO TABS Oral Take 75 mg by mouth 3 (three) times daily.    Marland Kitchen VERAPAMIL HCL ER (CO) 240 MG PO TB24 Oral Take 240 mg by mouth daily.    Marland Kitchen ZOLPIDEM TARTRATE 10 MG PO TABS Oral Take 10 mg by mouth at bedtime.      BP 111/71  Pulse 73  Temp(Src) 97.8 F (36.6 C) (Oral)  Resp 18  Ht 5\' 6"  (1.676 m)  Wt 249 lb (112.946 kg)  BMI 40.19 kg/m2  SpO2 98%  Physical Exam 1405: Physical examination:  Nursing notes reviewed; Vital signs and O2 SAT reviewed;  Constitutional: Well developed, Well nourished, Well hydrated, In no acute distress; Head:  Normocephalic, atraumatic; Eyes: EOMI, PERRL, No scleral icterus; ENMT: TM's clear bilat, +edemetous nasal turbinates bilat with clear rhinorrhea, +tender to percuss frontal and bilat maxillary sinuses. Mouth and pharynx normal, Mucous membranes moist; Neck: Supple, Full range of motion, No lymphadenopathy; Cardiovascular: Regular rate and rhythm, No murmur or gallop; Respiratory: Breath sounds clear & equal bilaterally, No rales, rhonchi, wheezes, speaking full sentences with ease, Normal respiratory effort/excursion; Chest: Nontender, Movement normal; Abdomen: Soft, Nontender, Nondistended, Normal bowel sounds; Extremities: Pulses normal, No tenderness, +FROM bilat knees, including able to lift extended bilat LE's off stretcher, and extend bilat lower legs against resistance.  No ligamentous laxity.  No patellar or quad tendon step-offs.  NMS intact bilat feet, strong pedal pp.  No bilat proximal fibular head tenderness.  No bilat edema, ecchymosis, erythema, warmth.  NT bilat knees, ankles, feet.  Bilat LE muscle compartments soft.  +plantarflexion of bilat feet w/calf squeeze.  No palpable gap bilat  Achilles's tendons.  No edema, No calf edema or asymmetry.; Neuro: AA&Ox3, Major CN grossly intact. Speech clear, no facial droop. No gross focal motor or sensory deficits in extremities.; Skin: Color normal, Warm, Dry.    ED Course  Procedures    MDM  MDM Reviewed: nursing note, vitals and previous chart Interpretation: x-ray      3:34 PM:  Per ED RN, pt apparently refused to have XR completed until she got her pain meds, then left the ED after she received her pain meds.         Laray Anger, DO 07/29/11 1739

## 2011-07-29 LAB — GLUCOSE, CAPILLARY: Glucose-Capillary: 135 mg/dL — ABNORMAL HIGH (ref 70–99)

## 2011-08-14 ENCOUNTER — Telehealth: Payer: Self-pay | Admitting: Gastroenterology

## 2011-08-14 NOTE — Telephone Encounter (Signed)
Called [facility] to follow up on referral- spoke with Victorino Dike again - she stated pt has not had the manometry and when they spoke with her she stated she wanted to wait

## 2011-08-16 ENCOUNTER — Emergency Department (HOSPITAL_COMMUNITY)
Admission: EM | Admit: 2011-08-16 | Discharge: 2011-08-17 | Disposition: A | Discharge status: Home / Self Care | Payer: PRIVATE HEALTH INSURANCE | Attending: Emergency Medicine | Admitting: Emergency Medicine

## 2011-08-16 ENCOUNTER — Encounter (HOSPITAL_COMMUNITY): Payer: Self-pay | Admitting: *Deleted

## 2011-08-16 ENCOUNTER — Emergency Department (HOSPITAL_COMMUNITY): Payer: PRIVATE HEALTH INSURANCE

## 2011-08-16 DIAGNOSIS — R42 Dizziness and giddiness: Secondary | ICD-10-CM | POA: Insufficient documentation

## 2011-08-16 DIAGNOSIS — E876 Hypokalemia: Secondary | ICD-10-CM

## 2011-08-16 DIAGNOSIS — R5381 Other malaise: Secondary | ICD-10-CM | POA: Insufficient documentation

## 2011-08-16 DIAGNOSIS — G43909 Migraine, unspecified, not intractable, without status migrainosus: Secondary | ICD-10-CM

## 2011-08-16 DIAGNOSIS — R0789 Other chest pain: Secondary | ICD-10-CM

## 2011-08-16 LAB — GLUCOSE, CAPILLARY: Glucose-Capillary: 112 mg/dL — ABNORMAL HIGH (ref 70–99)

## 2011-08-16 MED ORDER — DIPHENHYDRAMINE HCL 50 MG/ML IJ SOLN
50.0000 mg | Freq: Once | INTRAMUSCULAR | Status: AC
  Administered 2011-08-16: 50 mg via INTRAVENOUS
  Filled 2011-08-16: qty 1

## 2011-08-16 MED ORDER — HYDROMORPHONE HCL PF 1 MG/ML IJ SOLN
1.0000 mg | Freq: Once | INTRAMUSCULAR | Status: AC
  Administered 2011-08-16: 1 mg via INTRAVENOUS

## 2011-08-16 MED ORDER — HYDROMORPHONE HCL PF 1 MG/ML IJ SOLN
1.0000 mg | Freq: Once | INTRAMUSCULAR | Status: DC
  Filled 2011-08-16: qty 1

## 2011-08-16 MED ORDER — ONDANSETRON HCL 4 MG/2ML IJ SOLN
4.0000 mg | Freq: Once | INTRAMUSCULAR | Status: AC
  Administered 2011-08-16: 4 mg via INTRAVENOUS
  Filled 2011-08-16: qty 2

## 2011-08-16 NOTE — ED Notes (Signed)
PA at bedside to assess pt.

## 2011-08-16 NOTE — ED Notes (Signed)
Pt presents with multiple c/o chest pain x 1+ weeks, headache and fatigue. Pt reports seeing Dr Gerda Diss and seen at [facility] to where she singed herself out AMA as she felt she was not being treated for her headache in the past 2 weeks, Pt refers to pain as central chest that radiates into ears and jaw. Pain worsens at time without precipitating factors. EKG completed with no distress noted. NAD noted
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
## 2011-08-16 NOTE — ED Notes (Signed)
Pt presents with c/o headache, central chest pain, fatigue with dizziness and radiating pain into neck x 1 + weeks.

## 2011-08-17 LAB — CBC
HCT: 36.6 % (ref 36.0–46.0)
Hemoglobin: 11.6 g/dL — ABNORMAL LOW (ref 12.0–15.0)
MCH: 26.2 pg (ref 26.0–34.0)
MCHC: 31.7 g/dL (ref 30.0–36.0)
MCV: 82.6 fL (ref 78.0–100.0)
Platelets: 372 10*3/uL (ref 150–400)
RBC: 4.43 MIL/uL (ref 3.87–5.11)
RDW: 13.7 % (ref 11.5–15.5)
WBC: 10.6 10*3/uL — ABNORMAL HIGH (ref 4.0–10.5)

## 2011-08-17 LAB — DIFFERENTIAL
Basophils Absolute: 0 10*3/uL (ref 0.0–0.1)
Basophils Relative: 0 % (ref 0–1)
Eosinophils Absolute: 0.2 10*3/uL (ref 0.0–0.7)
Eosinophils Relative: 2 % (ref 0–5)
Lymphocytes Relative: 29 % (ref 12–46)
Lymphs Abs: 3.1 10*3/uL (ref 0.7–4.0)
Monocytes Absolute: 0.5 10*3/uL (ref 0.1–1.0)
Monocytes Relative: 4 % (ref 3–12)
Neutro Abs: 6.9 10*3/uL (ref 1.7–7.7)
Neutrophils Relative %: 65 % (ref 43–77)

## 2011-08-17 LAB — BASIC METABOLIC PANEL
BUN: 12 mg/dL (ref 6–23)
CO2: 26 mEq/L (ref 19–32)
Calcium: 9.5 mg/dL (ref 8.4–10.5)
Chloride: 100 mEq/L (ref 96–112)
Creatinine, Ser: 0.75 mg/dL (ref 0.50–1.10)
GFR calc Af Amer: >90 mL/min (ref >90)
GFR calc non Af Amer: >90 mL/min (ref >90)
Glucose, Bld: 107 mg/dL — ABNORMAL HIGH (ref 70–99)
Potassium: 3.1 mEq/L — ABNORMAL LOW (ref 3.5–5.1)
Sodium: 137 mEq/L (ref 135–145)

## 2011-08-17 LAB — POCT I-STAT TROPONIN I: Troponin i, poc: 0 ng/mL (ref 0.00–0.08)

## 2011-08-17 MED ORDER — POTASSIUM CHLORIDE CRYS ER 20 MEQ PO TBCR
20.0000 meq | EXTENDED_RELEASE_TABLET | Freq: Once | ORAL | Status: AC
  Administered 2011-08-17: 20 meq via ORAL
  Filled 2011-08-17: qty 1

## 2011-08-17 MED ORDER — POTASSIUM CHLORIDE ER 10 MEQ PO TBCR: 20.0000 meq | EXTENDED_RELEASE_TABLET | Freq: Two times a day (BID) | ORAL | Status: DC

## 2011-08-17 MED ORDER — OXYCODONE-ACETAMINOPHEN 5-325 MG PO TABS: 1.0000 | ORAL_TABLET | ORAL | Status: AC | PRN

## 2011-08-17 NOTE — ED Provider Notes (Signed)
Medical screening examination/treatment/procedure(s) were performed by non-physician practitioner and as supervising physician I was immediately available for consultation/collaboration.  Shelda Jakes, MD 08/17/11 [phone]

## 2011-08-17 NOTE — ED Notes (Signed)
Pt presented to triage c/o chest pain and headache.  Pt presently chewing on ice cubes.  Requested that pt not eat any more ice until physician could evaluate pt.  Pt became very angry.  Pt, moved to room and family requested to wait in waiting area until pt was settled.  Pt again very angry at request and demanding another nurse.  Pt refused to engage in conversation and again demanded another nurse care for her.

## 2011-08-17 NOTE — ED Notes (Signed)
Discharge instructions reviewed with pt, questions answered. Pt verbalized understanding.  

## 2011-08-17 NOTE — Discharge Instructions (Signed)
Chest Pain (Nonspecific) It is often hard to give a specific diagnosis for the cause of chest pain. There is always a chance that your pain could be related to something serious, such as a heart attack or a blood clot in the lungs. You need to follow up with your caregiver for further evaluation. CAUSES   Heartburn.   Pneumonia or bronchitis.   Anxiety or stress.   Inflammation around your heart (pericarditis) or lung (pleuritis or pleurisy).   A blood clot in the lung.   A collapsed lung (pneumothorax). It can develop suddenly on its own (spontaneous pneumothorax) or from injury (trauma) to the chest.   Shingles infection (herpes zoster virus).  The chest wall is composed of bones, muscles, and cartilage. Any of these can be the source of the pain.  The bones can be bruised by injury.   The muscles or cartilage can be strained by coughing or overwork.   The cartilage can be affected by inflammation and become sore (costochondritis).  DIAGNOSIS  Lab tests or other studies, such as X-rays, electrocardiography, stress testing, or cardiac imaging, may be needed to find the cause of your pain.  TREATMENT   Treatment depends on what may be causing your chest pain. Treatment may include:   Acid blockers for heartburn.   Anti-inflammatory medicine.   Pain medicine for inflammatory conditions.   Antibiotics if an infection is present.   You may be advised to change lifestyle habits. This includes stopping smoking and avoiding alcohol, caffeine, and chocolate.   You may be advised to keep your head raised (elevated) when sleeping. This reduces the chance of acid going backward from your stomach into your esophagus.   Most of the time, nonspecific chest pain will improve within 2 to 3 days with rest and mild pain medicine.  HOME CARE INSTRUCTIONS   If antibiotics were prescribed, take your antibiotics as directed. Finish them even if you start to feel better.   For the next few  days, avoid physical activities that bring on chest pain. Continue physical activities as directed.   Do not smoke.   Avoid drinking alcohol.   Only take over-the-counter or prescription medicine for pain, discomfort, or fever as directed by your caregiver.   Follow your caregiver's suggestions for further testing if your chest pain does not go away.   Keep any follow-up appointments you made. If you do not go to an appointment, you could develop lasting (chronic) problems with pain. If there is any problem keeping an appointment, you must call to reschedule.  SEEK MEDICAL CARE IF:   You think you are having problems from the medicine you are taking. Read your medicine instructions carefully.   Your chest pain does not go away, even after treatment.   You develop a rash with blisters on your chest.  SEEK IMMEDIATE MEDICAL CARE IF:   You have increased chest pain or pain that spreads to your arm, neck, jaw, back, or abdomen.   You develop shortness of breath, an increasing cough, or you are coughing up blood.   You have severe back or abdominal pain, feel nauseous, or vomit.   You develop severe weakness, fainting, or chills.   You have a fever.  THIS IS AN EMERGENCY. Do not wait to see if the pain will go away. Get medical help at once. Call your local emergency services (911 in [place] Do not drive yourself to the hospital. MAKE SURE YOU:   Understand these instructions.     Will watch your condition.   Will get help right away if you are not doing well or get worse.  Document Released: 12/05/2004 Document Revised: 02/14/2011 Document Reviewed: 10/01/2007 [facility] Patient Information 2012 [place], [place]   .Migraine Headache A migraine headache is an intense, throbbing pain on one or both sides of your head. The exact cause of a migraine headache is not always known. A migraine may be caused when nerves in the brain become irritated and release chemicals that cause swelling  within blood vessels, causing pain. Many migraine sufferers have a family history of migraines. Before you get a migraine you may or may not get an aura. An aura is a group of symptoms that can predict the beginning of a migraine. An aura may include:  Visual changes such as:   Flashing lights.   Bright spots or zig-zag lines.   Tunnel vision.   Feelings of numbness.   Trouble talking.   Muscle weakness.  SYMPTOMS  Pain on one or both sides of your head.   Pain that is pulsating or throbbing in nature.   Pain that is severe enough to prevent daily activities.   Pain that is aggravated by any daily physical activity.   Nausea (feeling sick to your stomach), vomiting, or both.   Pain with exposure to bright lights, loud noises, or activity.   General sensitivity to bright lights or loud noises.  MIGRAINE TRIGGERS Examples of triggers of migraine headaches include:   Alcohol.   Smoking.   Stress.   It may be related to menses (female menstruation).   Aged cheeses.   Foods or drinks that contain nitrates, glutamate, aspartame, or tyramine.   Lack of sleep.   Chocolate.   Caffeine.   Hunger.   Medications such as nitroglycerine (used to treat chest pain), birth control pills, estrogen, and some blood pressure medications.  DIAGNOSIS  A migraine headache is often diagnosed based on:  Symptoms.   Physical examination.   A computerized X-ray scan (computed tomography, CT) of your head.  TREATMENT  Medications can help prevent migraines if they are recurrent or should they become recurrent. Your caregiver can help you with a medication or treatment program that will be helpful to you.   Lying down in a dark, quiet room may be helpful.   Keeping a headache diary may help you find a trend as to what may be triggering your headaches.  SEEK IMMEDIATE MEDICAL CARE IF:   You have confusion, personality changes or seizures.   You have headaches that wake you  from sleep.   You have an increased frequency in your headaches.   You have a stiff neck.   You have a loss of vision.   You have muscle weakness.   You start losing your balance or have trouble walking.   You feel faint or pass out.  MAKE SURE YOU:   Understand these instructions.   Will watch your condition.   Will get help right away if you are not doing well or get worse.  Document Released: 02/25/2005 Document Revised: 02/14/2011 Document Reviewed: 10/11/2008 [facility] Patient Information 2012 [place], [place].   Your lab tests, x-ray and ECG tonight show no changes or significant findings which would explain your symptoms.  You may use the medication prescribed if needed for pain if it persists when you wake tomorrow, however do not drive within 4 hours of taking as this medicine will make you drowsy.  You also been prescribed potassium as  your level is a little low tonight and should be supplemented for the next week.  Please call you Dr. on Monday if your symptoms persist.

## 2011-08-20 LAB — POCT I-STAT TROPONIN I: Troponin i, poc: 0 ng/mL (ref 0.00–0.08)

## 2011-08-22 ENCOUNTER — Emergency Department (HOSPITAL_COMMUNITY): Payer: PRIVATE HEALTH INSURANCE

## 2011-08-22 ENCOUNTER — Encounter (HOSPITAL_COMMUNITY): Payer: Self-pay | Admitting: Emergency Medicine

## 2011-08-22 ENCOUNTER — Emergency Department (HOSPITAL_COMMUNITY)
Admission: EM | Admit: 2011-08-22 | Discharge: 2011-08-23 | Disposition: A | Discharge status: Home / Self Care | Payer: PRIVATE HEALTH INSURANCE | Attending: Emergency Medicine | Admitting: Emergency Medicine

## 2011-08-22 DIAGNOSIS — S1093XA Contusion of unspecified part of neck, initial encounter: Secondary | ICD-10-CM | POA: Insufficient documentation

## 2011-08-22 DIAGNOSIS — E119 Type 2 diabetes mellitus without complications: Secondary | ICD-10-CM | POA: Insufficient documentation

## 2011-08-22 DIAGNOSIS — F319 Bipolar disorder, unspecified: Secondary | ICD-10-CM | POA: Insufficient documentation

## 2011-08-22 DIAGNOSIS — IMO0002 Reserved for concepts with insufficient information to code with codable children: Secondary | ICD-10-CM | POA: Insufficient documentation

## 2011-08-22 DIAGNOSIS — D649 Anemia, unspecified: Secondary | ICD-10-CM | POA: Insufficient documentation

## 2011-08-22 DIAGNOSIS — G8929 Other chronic pain: Secondary | ICD-10-CM | POA: Insufficient documentation

## 2011-08-22 DIAGNOSIS — S0003XA Contusion of scalp, initial encounter: Secondary | ICD-10-CM | POA: Insufficient documentation

## 2011-08-22 DIAGNOSIS — S0093XA Contusion of unspecified part of head, initial encounter: Secondary | ICD-10-CM

## 2011-08-22 MED ORDER — SODIUM CHLORIDE 0.9 % IV SOLN
Freq: Once | INTRAVENOUS | Status: AC
  Administered 2011-08-22: via INTRAVENOUS

## 2011-08-22 MED ORDER — ONDANSETRON HCL 4 MG/2ML IJ SOLN
4.0000 mg | Freq: Once | INTRAMUSCULAR | Status: AC
  Administered 2011-08-22: 4 mg via INTRAVENOUS
  Filled 2011-08-22: qty 2

## 2011-08-22 MED ORDER — HYDROMORPHONE HCL PF 2 MG/ML IJ SOLN
2.0000 mg | Freq: Once | INTRAMUSCULAR | Status: AC
  Administered 2011-08-22: 2 mg via INTRAVENOUS
  Filled 2011-08-22: qty 1

## 2011-08-22 MED ORDER — METHYLPREDNISOLONE SODIUM SUCC 125 MG IJ SOLR
125.0000 mg | Freq: Once | INTRAMUSCULAR | Status: AC
  Administered 2011-08-22: 125 mg via INTRAVENOUS
  Filled 2011-08-22: qty 2

## 2011-08-22 MED ORDER — DIPHENHYDRAMINE HCL 50 MG/ML IJ SOLN
25.0000 mg | Freq: Once | INTRAMUSCULAR | Status: AC
  Administered 2011-08-22: 25 mg via INTRAVENOUS
  Filled 2011-08-22: qty 1
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
## 2011-08-23 MED ORDER — HYDROMORPHONE HCL PF 1 MG/ML IJ SOLN
INTRAMUSCULAR | Status: AC
  Administered 2011-08-23: 1 mg via INTRAVENOUS
  Filled 2011-08-23: qty 1

## 2011-08-23 MED ORDER — HYDROMORPHONE HCL PF 1 MG/ML IJ SOLN
1.0000 mg | Freq: Once | INTRAMUSCULAR | Status: AC
  Administered 2011-08-23: 1 mg via INTRAVENOUS

## 2011-08-23 MED ORDER — DIPHENHYDRAMINE HCL 50 MG/ML IJ SOLN
25.0000 mg | Freq: Once | INTRAMUSCULAR | Status: AC
  Administered 2011-08-23: 25 mg via INTRAVENOUS
  Filled 2011-08-23: qty 1
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
## 2011-08-23 NOTE — Discharge Instructions (Signed)
Contusion A contusion is a deep bruise. Contusions are the result of an injury that caused bleeding under the skin. The contusion may turn blue, purple, or yellow. Minor injuries will give you a painless contusion, but more severe contusions may stay painful and swollen for a few weeks.  CAUSES  A contusion is usually caused by a blow, trauma, or direct force to an area of the body. SYMPTOMS   Swelling and redness of the injured area.   Bruising of the injured area.   Tenderness and soreness of the injured area.   Pain.  DIAGNOSIS  The diagnosis can be made by taking a history and physical exam. An X-ray, CT scan, or MRI may be needed to determine if there were any associated injuries, such as fractures. TREATMENT  Specific treatment will depend on what area of the body was injured. In general, the best treatment for a contusion is resting, icing, elevating, and applying cold compresses to the injured area. Over-the-counter medicines may also be recommended for pain control. Ask your caregiver what the best treatment is for your contusion. HOME CARE INSTRUCTIONS   Put ice on the injured area.   Put ice in a plastic bag.   Place a towel between your skin and the bag.   Leave the ice on for 15 to 20 minutes, 3 to 4 times a day.   Only take over-the-counter or prescription medicines for pain, discomfort, or fever as directed by your caregiver. Your caregiver may recommend avoiding anti-inflammatory medicines (aspirin, ibuprofen, and naproxen) for 48 hours because these medicines may increase bruising.   Rest the injured area.   If possible, elevate the injured area to reduce swelling.  SEEK IMMEDIATE MEDICAL CARE IF:   You have increased bruising or swelling.   You have pain that is getting worse.   Your swelling or pain is not relieved with medicines.  MAKE SURE YOU:   Understand these instructions.   Will watch your condition.   Will get help right away if you are not  doing well or get worse.  Document Released: 12/05/2004 Document Revised: 02/14/2011 Document Reviewed: 12/31/2010 [facility] Patient Information 2012 [place], [place].   Take your percocet prescribed on your last ED visit if needed for pain.  Your head CT shows no acute abnormalities.  Follow up with your PCP as needed.  Return to the ED if you note any change in level of consciousness, behavior or coordination.

## 2011-08-23 NOTE — ED Notes (Signed)
Pt demanded that I ask another doctor for pain medication.  I spoke with Dr. Dierdre Highman and he gave a verbal order for dilaudid 1 mg IV.

## 2011-08-29 ENCOUNTER — Encounter (HOSPITAL_COMMUNITY): Payer: Self-pay | Admitting: *Deleted

## 2011-08-29 ENCOUNTER — Emergency Department (HOSPITAL_COMMUNITY)
Admission: EM | Admit: 2011-08-29 | Discharge: 2011-08-30 | Disposition: A | Discharge status: Home / Self Care | Payer: PRIVATE HEALTH INSURANCE | Attending: Emergency Medicine | Admitting: Emergency Medicine

## 2011-08-29 DIAGNOSIS — F319 Bipolar disorder, unspecified: Secondary | ICD-10-CM | POA: Insufficient documentation

## 2011-08-29 DIAGNOSIS — S91109A Unspecified open wound of unspecified toe(s) without damage to nail, initial encounter: Secondary | ICD-10-CM | POA: Insufficient documentation

## 2011-08-29 DIAGNOSIS — Z79899 Other long term (current) drug therapy: Secondary | ICD-10-CM | POA: Insufficient documentation

## 2011-08-29 DIAGNOSIS — X58XXXA Exposure to other specified factors, initial encounter: Secondary | ICD-10-CM | POA: Insufficient documentation

## 2011-08-29 DIAGNOSIS — R51 Headache: Secondary | ICD-10-CM | POA: Insufficient documentation

## 2011-08-29 DIAGNOSIS — E119 Type 2 diabetes mellitus without complications: Secondary | ICD-10-CM | POA: Insufficient documentation

## 2011-08-29 NOTE — ED Notes (Signed)
Pt c/o pain to right small toe. Pt also c/o migraine headache.

## 2011-08-29 NOTE — ED Notes (Addendum)
C/o HA pain onset this morning; reports hx of chronic migraines.  States takes verapamil for this everyday, but has no medication for pain.  Pt is in no obvious distress, laughing and talking with visitors at bedside.  Denies n/v.  Alert, oriented x 4; answers questions appropriately. Also c/o right 5th toe pain; denies injury.

## 2011-08-30 MED ORDER — HYDROMORPHONE HCL PF 1 MG/ML IJ SOLN
1.0000 mg | Freq: Once | INTRAMUSCULAR | Status: AC
  Administered 2011-08-30: 1 mg via INTRAVENOUS

## 2011-08-30 MED ORDER — PROMETHAZINE HCL 25 MG/ML IJ SOLN: 12.5000 mg | Freq: Once | INTRAMUSCULAR | Status: DC

## 2011-08-30 MED ORDER — DOXYCYCLINE HYCLATE 100 MG PO TABS
100.0000 mg | ORAL_TABLET | Freq: Once | ORAL | Status: AC
  Administered 2011-08-30: 100 mg via ORAL
  Filled 2011-08-30 (×2): qty 1

## 2011-08-30 MED ORDER — HYDROMORPHONE HCL PF 1 MG/ML IJ SOLN: 1.0000 mg | Freq: Once | INTRAMUSCULAR | Status: DC

## 2011-08-30 MED ORDER — PROMETHAZINE HCL 25 MG PO TABS: 25.0000 mg | ORAL_TABLET | Freq: Four times a day (QID) | ORAL | Status: DC | PRN

## 2011-08-30 MED ORDER — DOXYCYCLINE HYCLATE 100 MG PO CAPS: 100.0000 mg | ORAL_CAPSULE | Freq: Two times a day (BID) | ORAL | Status: DC

## 2011-08-30 MED ORDER — DIPHENHYDRAMINE HCL 50 MG/ML IJ SOLN: 25.0000 mg | Freq: Once | INTRAMUSCULAR | Status: DC

## 2011-08-30 MED ORDER — PROMETHAZINE HCL 12.5 MG PO TABS
25.0000 mg | ORAL_TABLET | Freq: Once | ORAL | Status: DC
  Filled 2011-08-30: qty 2

## 2011-08-30 MED ORDER — HYDROMORPHONE HCL PF 1 MG/ML IJ SOLN
1.0000 mg | Freq: Once | INTRAMUSCULAR | Status: DC
  Filled 2011-08-30: qty 1

## 2011-08-30 MED ORDER — HYDROCODONE-ACETAMINOPHEN 7.5-325 MG PO TABS: 1.0000 | ORAL_TABLET | Freq: Four times a day (QID) | ORAL | Status: DC | PRN

## 2011-08-30 MED ORDER — HYDROMORPHONE HCL PF 1 MG/ML IJ SOLN
INTRAMUSCULAR | Status: AC
  Administered 2011-08-30: 1 mg via INTRAVENOUS
  Filled 2011-08-30: qty 1

## 2011-08-30 MED ORDER — PROMETHAZINE HCL 25 MG/ML IJ SOLN
INTRAMUSCULAR | Status: AC
  Administered 2011-08-30: 12.5 mg via INTRAVENOUS
  Filled 2011-08-30: qty 1

## 2011-08-30 MED ORDER — DOXYCYCLINE HYCLATE 100 MG PO CAPS: 100.0000 mg | ORAL_CAPSULE | Freq: Two times a day (BID) | ORAL | Status: AC

## 2011-08-30 MED ORDER — DIPHENHYDRAMINE HCL 50 MG/ML IJ SOLN
INTRAMUSCULAR | Status: AC
  Administered 2011-08-30: 25 mg via INTRAVENOUS
  Filled 2011-08-30: qty 1

## 2011-08-30 MED ORDER — DIPHENHYDRAMINE HCL 25 MG PO CAPS
25.0000 mg | ORAL_CAPSULE | Freq: Once | ORAL | Status: DC
  Filled 2011-08-30: qty 1

## 2011-08-30 MED ORDER — PROMETHAZINE HCL 25 MG/ML IJ SOLN
12.5000 mg | Freq: Once | INTRAMUSCULAR | Status: AC
  Administered 2011-08-30: 12.5 mg via INTRAVENOUS

## 2011-08-30 MED ORDER — DIPHENHYDRAMINE HCL 50 MG/ML IJ SOLN
25.0000 mg | Freq: Once | INTRAMUSCULAR | Status: AC
  Administered 2011-08-30: 25 mg via INTRAVENOUS

## 2011-08-30 NOTE — ED Provider Notes (Signed)
History     CSN: [id number]  Arrival date & time 08/29/11  2259   First MD Initiated Contact with Patient 08/29/11 2314      Chief Complaint  Patient presents with  . Toe Pain  . Migraine    (Consider location/radiation/quality/duration/timing/severity/associated sxs/prior treatment) Patient is a 31 y.o. female presenting with toe pain and migraine. The history is provided by the patient.  Toe Pain This is a new problem. The current episode started in the past 7 days. The problem occurs constantly. The problem has been gradually worsening. Associated symptoms include headaches and nausea. Pertinent negatives include no chills. The symptoms are aggravated by walking. She has tried oral narcotics for the symptoms. The treatment provided mild relief.  Migraine Associated symptoms include headaches and nausea. Pertinent negatives include no chills.    Past Medical History  Diagnosis Date  . Breast infection 04/04/2010    hospitalized, given antibiotics, referred to surgeon  . Bipolar disorder   . Vaginitis 04/04/2010  . Anemia   . Migraine headache   . Hemorrhoids, internal   . Obesity   . Diabetes mellitus   . Colonic adenoma   . Helicobacter pylori (H. pylori)     s/p treatment  . Anxiety   . Complication of anesthesia   . PONV (postoperative nausea and vomiting)   . Diverticulitis   . Chronic pain     Past Surgical History  Procedure Date  . Cholecystectomy   . Tubal ligation   . Appendectomy 2008    lower abd pain  . S/p hysterectomy 08/2007    No Cancer Cells  . Breast reduction surgery 08/2008    from a 44 DDD to a 44 C  . Bilateral salpingoophorectomy 06/2009  . Tumor removal     from left shoulder  . Colonoscopy 01/04/10    tubular adenoma of cecum, normal TI, patchy fibrotic changes involving sigm, desc s/p bx neg. Next TCS 12/2016  . Esophagogastroduodenoscopy 01/04/10    normal esophagus s/p dilation, small hh, H pylori gastritis  .  Esophagogastroduodenoscopy 2008    florid gastroesophageal reflux during exam but normal mucosa  . Abdominal hysterectomy   . Wisdom tooth extraction     Family History  Problem Relation Age of Onset  . Cancer Mother     cervical, deceased age 5  . Cancer Father     lung cancer, deceased age 71  . Anesthesia problems Neg Hx   . Hypotension Neg Hx   . Malignant hyperthermia Neg Hx   . Pseudochol deficiency Neg Hx     History  Substance Use Topics  . Smoking status: Never Smoker   . Smokeless tobacco: Never Used  . Alcohol Use: No    OB History    Grav Para Term Preterm Abortions TAB SAB Ect Mult Living                  Review of Systems  Constitutional: Negative for chills.  Gastrointestinal: Positive for nausea.  Neurological: Positive for headaches.  Psychiatric/Behavioral: The patient is nervous/anxious.     Allergies  Tomato; Ketorolac tromethamine; Nalbuphine; Tramadol hcl; and Zofran  Home Medications   Current Outpatient Rx  Name Route Sig Dispense Refill  . ALPRAZOLAM 1 MG PO TABS Oral Take 1 tablet by mouth 3 (three) times daily as needed.     Marland Kitchen HYDROCODONE-ACETAMINOPHEN 7.5-325 MG PO TABS Oral Take 1 tablet by mouth every 8 (eight) hours as needed. As needed for back pain  but doesn't take regularly.    Marland Kitchen POTASSIUM CHLORIDE ER 10 MEQ PO TBCR Oral Take 2 tablets (20 mEq total) by mouth 2 (two) times daily. 10 tablet 0  . VENLAFAXINE HCL 75 MG PO TABS Oral Take 75 mg by mouth 3 (three) times daily.    Marland Kitchen VERAPAMIL HCL ER (CO) 240 MG PO TB24 Oral Take 240 mg by mouth daily.    Marland Kitchen ZOLPIDEM TARTRATE 10 MG PO TABS Oral Take 10 mg by mouth at bedtime.      BP 122/84  Pulse 88  Temp 98.3 F (36.8 C) (Oral)  Resp 16  Ht 5\' 6"  (1.676 m)  Wt 239 lb (108.41 kg)  BMI 38.58 kg/m2  SpO2 97%  Physical Exam  Nursing note and vitals reviewed. Constitutional: She is oriented to person, place, and time. She appears well-developed and well-nourished.  Non-toxic  appearance.  HENT:  Head: Normocephalic.  Right Ear: Tympanic membrane and external ear normal.  Left Ear: Tympanic membrane and external ear normal.  Eyes: EOM and lids are normal. Pupils are equal, round, and reactive to light.  Neck: Normal range of motion. Neck supple. Carotid bruit is not present.  Cardiovascular: Normal rate, regular rhythm, normal heart sounds, intact distal pulses and normal pulses.   Pulmonary/Chest: Breath sounds normal. No respiratory distress.  Abdominal: Soft. Bowel sounds are normal. There is no tenderness. There is no guarding.  Musculoskeletal: Normal range of motion.       There is a small open wound to the lateral corner of the right fifth toe in the area of the tail toenail. There is no red streaking. The foot is not hot. The dorsalis pedis and posterior tibial pulses are symmetrical. There no lesions between the toes. There is good capillary refill. There is no palpable nodes of the inguinal area.  Lymphadenopathy:       Head (right side): No submandibular adenopathy present.       Head (left side): No submandibular adenopathy present.    She has no cervical adenopathy.  Neurological: She is alert and oriented to person, place, and time. She has normal strength. No cranial nerve deficit or sensory deficit. She exhibits normal muscle tone. Coordination normal.  Skin: Skin is warm and dry.  Psychiatric: She has a normal mood and affect. Her speech is normal.    ED Course  Procedures (including critical care time)  Labs Reviewed - No data to display No results found.   No diagnosis found.    MDM  I have reviewed nursing notes, vital signs, and all appropriate lab and imaging results for this patient. Patient presents to the emergency department with a three-day history of a wound to the right small toe. Minimal drainage reported. No fever reported. It is of note that the patient is diabetic. The patient also complains of a headache mostly on the  right side. This is similar to her previous headaches. She requested assistance with this pain as well. The patient was treated with doxycycline 100 mg, Dilaudid 1 mg IM, Benadryl 25 mg, and promethazine 25 mg. Prescription given for doxycycline. Norco 7.5 mg #20, and promethazine 25 mg every 6 hours #12.      Kathie Dike, [place] 08/30/11 [phone]

## 2011-08-30 NOTE — ED Notes (Signed)
Pt alert & oriented x4, stable gait. Pt given discharge instructions, paperwork & prescription(s). Patient instructed to stop at the registration desk to finish any additional paperwork. pt verbalized understanding. Pt left department w/ no further questions.  

## 2011-08-30 NOTE — Discharge Instructions (Signed)
Cleanse wound with soap and water daily. Change bandage daily to the 5th toe. Please have this rechecked by Dr Gerda Diss next week. Doxycycline daily with food.

## 2011-08-30 NOTE — ED Provider Notes (Signed)
Medical screening examination/treatment/procedure(s) were performed by non-physician practitioner and as supervising physician I was immediately available for consultation/collaboration.  Shakeera Rightmyer S. Atreyu Mak, MD 08/30/11 0637 

## 2011-08-30 NOTE — ED Notes (Signed)
Pt refusing to take po medications, states "po meds do not work for her" PA notified, & to speak w/ pt.

## 2011-09-01 ENCOUNTER — Encounter (HOSPITAL_COMMUNITY): Payer: Self-pay | Admitting: *Deleted

## 2011-09-01 ENCOUNTER — Emergency Department (HOSPITAL_COMMUNITY)
Admission: EM | Admit: 2011-09-01 | Discharge: 2011-09-01 | Disposition: A | Discharge status: Home / Self Care | Payer: PRIVATE HEALTH INSURANCE | Attending: Emergency Medicine | Admitting: Emergency Medicine

## 2011-09-01 ENCOUNTER — Emergency Department (HOSPITAL_COMMUNITY): Payer: PRIVATE HEALTH INSURANCE

## 2011-09-01 DIAGNOSIS — M25569 Pain in unspecified knee: Secondary | ICD-10-CM | POA: Insufficient documentation

## 2011-09-01 DIAGNOSIS — G8929 Other chronic pain: Secondary | ICD-10-CM | POA: Insufficient documentation

## 2011-09-01 DIAGNOSIS — Z79899 Other long term (current) drug therapy: Secondary | ICD-10-CM | POA: Insufficient documentation

## 2011-09-01 DIAGNOSIS — R109 Unspecified abdominal pain: Secondary | ICD-10-CM | POA: Insufficient documentation

## 2011-09-01 DIAGNOSIS — F319 Bipolar disorder, unspecified: Secondary | ICD-10-CM | POA: Insufficient documentation

## 2011-09-01 DIAGNOSIS — R110 Nausea: Secondary | ICD-10-CM | POA: Insufficient documentation

## 2011-09-01 LAB — DIFFERENTIAL
Band Neutrophils: 0 % (ref 0–10)
Basophils Absolute: 0 10*3/uL (ref 0.0–0.1)
Basophils Relative: 0 % (ref 0–1)
Blasts: 0 %
Eosinophils Absolute: 0.8 10*3/uL — ABNORMAL HIGH (ref 0.0–0.7)
Eosinophils Relative: 5 % (ref 0–5)
Lymphocytes Relative: 21 % (ref 12–46)
Lymphs Abs: 3.2 10*3/uL (ref 0.7–4.0)
Metamyelocytes Relative: 0 %
Monocytes Absolute: 0.5 10*3/uL (ref 0.1–1.0)
Monocytes Relative: 3 % (ref 3–12)
Myelocytes: 0 %
Neutro Abs: 10.9 10*3/uL — ABNORMAL HIGH (ref 1.7–7.7)
Neutrophils Relative %: 71 % (ref 43–77)
Promyelocytes Absolute: 0 %
nRBC: 0 /100 WBC

## 2011-09-01 LAB — COMPREHENSIVE METABOLIC PANEL
ALT: 28 U/L (ref 0–35)
AST: 24 U/L (ref 0–37)
Albumin: 3.6 g/dL (ref 3.5–5.2)
Alkaline Phosphatase: 108 U/L (ref 39–117)
BUN: 8 mg/dL (ref 6–23)
CO2: 28 mEq/L (ref 19–32)
Calcium: 9.8 mg/dL (ref 8.4–10.5)
Chloride: 98 mEq/L (ref 96–112)
Creatinine, Ser: 0.66 mg/dL (ref 0.50–1.10)
GFR calc Af Amer: >90 mL/min (ref >90)
GFR calc non Af Amer: >90 mL/min (ref >90)
Glucose, Bld: 105 mg/dL — ABNORMAL HIGH (ref 70–99)
Potassium: 3.9 mEq/L (ref 3.5–5.1)
Sodium: 135 mEq/L (ref 135–145)
Total Bilirubin: 0.2 mg/dL — ABNORMAL LOW (ref 0.3–1.2)
Total Protein: 8 g/dL (ref 6.0–8.3)

## 2011-09-01 LAB — WET PREP, GENITAL
Clue Cells Wet Prep HPF POC: NONE SEEN
Trich, Wet Prep: NONE SEEN
Yeast Wet Prep HPF POC: NONE SEEN

## 2011-09-01 LAB — CBC
HCT: 37.9 % (ref 36.0–46.0)
Hemoglobin: 12.2 g/dL (ref 12.0–15.0)
MCH: 26.5 pg (ref 26.0–34.0)
MCHC: 32.2 g/dL (ref 30.0–36.0)
MCV: 82.2 fL (ref 78.0–100.0)
Platelets: 364 10*3/uL (ref 150–400)
RBC: 4.61 MIL/uL (ref 3.87–5.11)
RDW: 14 % (ref 11.5–15.5)
WBC: 15.4 10*3/uL — ABNORMAL HIGH (ref 4.0–10.5)

## 2011-09-01 LAB — URINALYSIS, ROUTINE W REFLEX MICROSCOPIC
Bilirubin Urine: NEGATIVE
Glucose, UA: NEGATIVE mg/dL
Hgb urine dipstick: NEGATIVE
Leukocytes, UA: NEGATIVE
Nitrite: NEGATIVE
Protein, ur: NEGATIVE mg/dL
Specific Gravity, Urine: 1.025 (ref 1.005–1.030)
Urobilinogen, UA: 0.2 mg/dL (ref 0.0–1.0)
pH: 6.5 (ref 5.0–8.0)

## 2011-09-01 LAB — LIPASE, BLOOD: Lipase: 19 U/L (ref 11–59)

## 2011-09-01 LAB — POCT PREGNANCY, URINE: Preg Test, Ur: NEGATIVE

## 2011-09-01 MED ORDER — SODIUM CHLORIDE 0.9 % IV SOLN
Freq: Once | INTRAVENOUS | Status: AC
  Administered 2011-09-01: 20 mL/h via INTRAVENOUS

## 2011-09-01 MED ORDER — ONDANSETRON HCL 4 MG/2ML IJ SOLN
4.0000 mg | Freq: Once | INTRAMUSCULAR | Status: DC
  Filled 2011-09-01: qty 2

## 2011-09-01 MED ORDER — HYDROMORPHONE HCL PF 1 MG/ML IJ SOLN
1.0000 mg | Freq: Once | INTRAMUSCULAR | Status: AC
  Administered 2011-09-01: 1 mg via INTRAVENOUS
  Filled 2011-09-01: qty 1

## 2011-09-01 MED ORDER — IOHEXOL 300 MG/ML  SOLN
100.0000 mL | Freq: Once | INTRAMUSCULAR | Status: AC | PRN
  Administered 2011-09-01: 100 mL via INTRAVENOUS

## 2011-09-01 MED ORDER — LORAZEPAM 2 MG/ML IJ SOLN
1.0000 mg | Freq: Once | INTRAMUSCULAR | Status: AC
  Administered 2011-09-01: 1 mg via INTRAVENOUS
  Filled 2011-09-01: qty 1

## 2011-09-01 MED ORDER — HYDROMORPHONE HCL PF 1 MG/ML IJ SOLN: 1.0000 mg | Freq: Once | INTRAMUSCULAR | Status: DC

## 2011-09-01 MED ORDER — MORPHINE SULFATE 4 MG/ML IJ SOLN
4.0000 mg | Freq: Once | INTRAMUSCULAR | Status: AC
  Administered 2011-09-01: 4 mg via INTRAVENOUS
  Filled 2011-09-01: qty 1

## 2011-09-01 MED ORDER — PROMETHAZINE HCL 25 MG PO TABS: 25.0000 mg | ORAL_TABLET | Freq: Four times a day (QID) | ORAL | Status: DC | PRN

## 2011-09-01 MED ORDER — PROMETHAZINE HCL 25 MG/ML IJ SOLN
25.0000 mg | Freq: Once | INTRAMUSCULAR | Status: AC
  Administered 2011-09-01: 25 mg via INTRAVENOUS
  Filled 2011-09-01: qty 1

## 2011-09-01 NOTE — ED Notes (Addendum)
Pt c/o lower abdominal pain, nausea, and right knee pain. Pt took hydrocodone no relief. Pt was having a panic attack upon ems arrival. Now pt states both legs are hurting her.

## 2011-09-01 NOTE — Discharge Instructions (Signed)
Abdominal Pain No serious cause of your abdominal pain was identified today. Follow up with your doctor this week. Return to the ED if you develop new or worsening symptoms. Abdominal pain can be caused by many things. Your caregiver decides the seriousness of your pain by an examination and possibly blood tests and X-rays. Many cases can be observed and treated at home. Most abdominal pain is not caused by a disease and will probably improve without treatment. However, in many cases, more time must pass before a clear cause of the pain can be found. Before that point, it may not be known if you need more testing, or if hospitalization or surgery is needed. HOME CARE INSTRUCTIONS   Do not take laxatives unless directed by your caregiver.   Take pain medicine only as directed by your caregiver.   Only take over-the-counter or prescription medicines for pain, discomfort, or fever as directed by your caregiver.   Try a clear liquid diet (broth, tea, or water) for as long as directed by your caregiver. Slowly move to a bland diet as tolerated.  SEEK IMMEDIATE MEDICAL CARE IF:   The pain does not go away.   You have a fever.   You keep throwing up (vomiting).   The pain is felt only in portions of the abdomen. Pain in the right side could possibly be appendicitis. In an adult, pain in the left lower portion of the abdomen could be colitis or diverticulitis.   You pass bloody or black tarry stools.  MAKE SURE YOU:   Understand these instructions.   Will watch your condition.   Will get help right away if you are not doing well or get worse.  Document Released: 12/05/2004 Document Revised: 02/14/2011 Document Reviewed: 10/14/2007 [facility] Patient Information 2012 [place], [place].

## 2011-09-01 NOTE — ED Provider Notes (Signed)
History     CSN: [id number]  Arrival date & time 09/01/11  0247   First MD Initiated Contact with Patient 09/01/11 [phone]      Chief Complaint  Patient presents with  . Abdominal Pain  . Nausea  . Knee Pain    (Consider location/radiation/quality/duration/timing/severity/associated sxs/prior treatment) HPI Comments: Patient presents with 8 hours of sharp lower abdominal pain associated with nausea. No vomiting. No change in bowel habits. No fever, vaginal bleeding, discharge, pain with urination or blood in the urine. Has not had this pain in the past. S/p TAHBSO, appendectomy, cholecystectomy. No change in chronic back pain.  Pain not relieved by home norco.  Tearful and anxious.  The history is provided by the patient.    Past Medical History  Diagnosis Date  . Breast infection 04/04/2010    hospitalized, given antibiotics, referred to surgeon  . Bipolar disorder   . Vaginitis 04/04/2010  . Anemia   . Migraine headache   . Hemorrhoids, internal   . Obesity   . Diabetes mellitus   . Colonic adenoma   . Helicobacter pylori (H. pylori)     s/p treatment  . Anxiety   . Complication of anesthesia   . PONV (postoperative nausea and vomiting)   . Diverticulitis   . Chronic pain     Past Surgical History  Procedure Date  . Cholecystectomy   . Tubal ligation   . Appendectomy 2008    lower abd pain  . S/p hysterectomy 08/2007    No Cancer Cells  . Breast reduction surgery 08/2008    from a 44 DDD to a 44 C  . Bilateral salpingoophorectomy 06/2009  . Tumor removal     from left shoulder  . Colonoscopy 01/04/10    tubular adenoma of cecum, normal TI, patchy fibrotic changes involving sigm, desc s/p bx neg. Next TCS 12/2016  . Esophagogastroduodenoscopy 01/04/10    normal esophagus s/p dilation, small hh, H pylori gastritis  . Esophagogastroduodenoscopy 2008    florid gastroesophageal reflux during exam but normal mucosa  . Abdominal hysterectomy   . Wisdom tooth  extraction     Family History  Problem Relation Age of Onset  . Cancer Mother     cervical, deceased age 4  . Cancer Father     lung cancer, deceased age 31  . Anesthesia problems Neg Hx   . Hypotension Neg Hx   . Malignant hyperthermia Neg Hx   . Pseudochol deficiency Neg Hx     History  Substance Use Topics  . Smoking status: Never Smoker   . Smokeless tobacco: Never Used  . Alcohol Use: No    OB History    Grav Para Term Preterm Abortions TAB SAB Ect Mult Living                  Review of Systems  Constitutional: Positive for activity change, appetite change and fatigue. Negative for fever.  HENT: Negative for congestion and rhinorrhea.   Respiratory: Negative for cough, chest tightness and shortness of breath.   Cardiovascular: Negative for chest pain.  Gastrointestinal: Positive for nausea and abdominal pain. Negative for diarrhea.  Genitourinary: Positive for pelvic pain. Negative for dysuria, hematuria, vaginal bleeding and vaginal discharge.  Musculoskeletal: Positive for back pain.  Skin: Negative for rash.  Neurological: Positive for headaches. Negative for dizziness, seizures and weakness.    Allergies  Tomato; Ketorolac tromethamine; Nalbuphine; Tramadol hcl; and Zofran  Home Medications   Current Outpatient Rx  Name Route Sig Dispense Refill  . ALPRAZOLAM 1 MG PO TABS Oral Take 1 tablet by mouth 3 (three) times daily as needed.     Marland Kitchen DOXYCYCLINE HYCLATE 100 MG PO CAPS Oral Take 1 capsule (100 mg total) by mouth 2 (two) times daily. 14 capsule 0  . DOXYCYCLINE HYCLATE 100 MG PO CAPS Oral Take 1 capsule (100 mg total) by mouth 2 (two) times daily. 14 capsule 0  . HYDROCODONE-ACETAMINOPHEN 7.5-325 MG PO TABS Oral Take 1 tablet by mouth every 8 (eight) hours as needed. As needed for back pain but doesn't take regularly.    Marland Kitchen HYDROCODONE-ACETAMINOPHEN 7.5-325 MG PO TABS Oral Take 1 tablet by mouth every 6 (six) hours as needed for pain. 20 tablet 0  .  HYDROCODONE-ACETAMINOPHEN 7.5-325 MG PO TABS Oral Take 1 tablet by mouth every 6 (six) hours as needed for pain. 20 tablet 0  . POTASSIUM CHLORIDE ER 10 MEQ PO TBCR Oral Take 2 tablets (20 mEq total) by mouth 2 (two) times daily. 10 tablet 0  . PROMETHAZINE HCL 25 MG PO TABS Oral Take 1 tablet (25 mg total) by mouth every 6 (six) hours as needed for nausea. 12 tablet 0  . PROMETHAZINE HCL 25 MG PO TABS Oral Take 1 tablet (25 mg total) by mouth every 6 (six) hours as needed for nausea. 12 tablet 0  . PROMETHAZINE HCL 25 MG PO TABS Oral Take 1 tablet (25 mg total) by mouth every 6 (six) hours as needed for nausea. 30 tablet 0  . VENLAFAXINE HCL 75 MG PO TABS Oral Take 75 mg by mouth 3 (three) times daily.    Marland Kitchen VERAPAMIL HCL ER (CO) 240 MG PO TB24 Oral Take 240 mg by mouth daily.    Marland Kitchen ZOLPIDEM TARTRATE 10 MG PO TABS Oral Take 10 mg by mouth at bedtime.      BP 101/78  Pulse 79  Temp 98.4 F (36.9 C) (Oral)  Resp 20  SpO2 99%  Physical Exam  Constitutional: She is oriented to person, place, and time. She appears well-developed and well-nourished. No distress.       Tearful anxious  HENT:  Head: Normocephalic and atraumatic.  Mouth/Throat: Oropharynx is clear and moist.  Eyes: Conjunctivae and EOM are normal. Pupils are equal, round, and reactive to light.  Neck: Normal range of motion. Neck supple.  Cardiovascular: Normal rate, regular rhythm and normal heart sounds.   Pulmonary/Chest: Effort normal and breath sounds normal. No respiratory distress.  Abdominal: Soft. There is tenderness. There is no rebound and no guarding.       TTP RLQ, LLQ, suprapubic, no guarding or rebound  Genitourinary: There is no tenderness on the right labia. There is no tenderness on the left labia. Cervix exhibits no motion tenderness. Right adnexum displays no mass and no tenderness. Left adnexum displays no mass and no tenderness. No vaginal discharge found.       Cervix absent  Musculoskeletal: Normal range  of motion. She exhibits no edema and no tenderness.  Neurological: She is alert and oriented to person, place, and time. No cranial nerve deficit.  Skin: Skin is warm.    ED Course  Procedures (including critical care time)  Labs Reviewed  URINALYSIS, ROUTINE W REFLEX MICROSCOPIC - Abnormal; Notable for the following:    Ketones, ur TRACE (*)     All other components within normal limits  CBC - Abnormal; Notable for the following:    WBC 15.4 (*)  All other components within normal limits  DIFFERENTIAL - Abnormal; Notable for the following:    Neutro Abs 10.9 (*)     Eosinophils Absolute 0.8 (*)     All other components within normal limits  COMPREHENSIVE METABOLIC PANEL - Abnormal; Notable for the following:    Glucose, Bld 105 (*)     Total Bilirubin 0.2 (*)     All other components within normal limits  WET PREP, GENITAL - Abnormal; Notable for the following:    WBC, Wet Prep HPF POC FEW (*)     All other components within normal limits  LIPASE, BLOOD  POCT PREGNANCY, URINE  GC/CHLAMYDIA PROBE AMP, GENITAL   Ct Abdomen Pelvis W Contrast  09/01/2011  *RADIOLOGY REPORT*  Clinical Data: Abdominal pain and nausea.  CT ABDOMEN AND PELVIS WITH CONTRAST  Technique:  Multidetector CT imaging of the abdomen and pelvis was performed following the standard protocol during bolus administration of intravenous contrast.  Contrast: OMNIPAQUE IOHEXOL 300 MG/ML  SOLN  Comparison: CT of the abdomen and pelvis performed 06/14/2011  Findings: Minimal bibasilar atelectasis is noted.  The liver and spleen are unremarkable in appearance.  The patient is status post cholecystectomy, with clips noted at the gallbladder fossa.  The pancreas and adrenal glands are unremarkable.  The kidneys are unremarkable in appearance.  There is no evidence of hydronephrosis.  No renal or ureteral stones are seen.  No perinephric stranding is appreciated.  No free fluid is identified.  The small bowel is  unremarkable in appearance.  The stomach is within normal limits.  No acute vascular abnormalities are seen.  The patient is status post appendectomy.  Contrast progresses to the level of the transverse colon.  The colon is unremarkable in appearance.  The bladder is decompressed and not well assessed.  The patient is status post hysterectomy; no suspicious adnexal masses are seen. No inguinal lymphadenopathy is seen.  No acute osseous abnormalities are identified.  IMPRESSION: No acute abnormalities seen within the abdomen or pelvis.  Original Report Authenticated By: Tonia Ghent, M.D.     1. Abdominal pain       MDM  Lower abdominal pain with nausea. No peritoneal signs.  Vitals stable.  Urinalysis negative. Pelvic exam benign. No vomiting in ED.  Patient comfortably sleeping, pain well controlled.  No abnormalities on CT. Stable for outpatient followup.       Glynn Octave, MD 09/02/11 [phone]

## 2011-09-01 NOTE — ED Notes (Signed)
Daughter states patient is requesting a "candy bar" because she feels like her blood sugar is dropping.  Advised her not to eat anything at this time.  Glucose was 105 .  Will recheck CBG

## 2011-09-01 NOTE — ED Provider Notes (Signed)
Medical screening examination/treatment/procedure(s) were performed by non-physician practitioner and as supervising physician I was immediately available for consultation/collaboration.  Sunnie Nielsen, MD 09/01/11 2337

## 2011-09-01 NOTE — ED Notes (Signed)
Voided using "female urinal".

## 2011-09-01 NOTE — ED Notes (Addendum)
Crying due to the pain - the hydrocodone she took at 22:00 did not help relieve any pain.  Has some nausea without vomiting.  Never had pain like this before.  States when she went to take a shower to see if it would help her pain she fell onto her side.

## 2011-09-01 NOTE — ED Notes (Signed)
Patient with no complaints at this time. Respirations even and unlabored. Skin warm/dry. Discharge instructions reviewed with patient at this time. Patient given opportunity to voice concerns/ask questions. IV removed per policy and band-aid applied to site. Patient discharged at this time and left Emergency Department with steady gait.  

## 2011-09-01 NOTE — ED Notes (Signed)
Has completed one bottle of contrast fluid po.   Will start on the second one soon

## 2011-09-01 NOTE — ED Notes (Signed)
Attempted to collect UA specimen - patient voided small amount and missed the cup. None obtained

## 2011-09-02 ENCOUNTER — Encounter (HOSPITAL_COMMUNITY): Payer: Self-pay | Admitting: *Deleted

## 2011-09-02 ENCOUNTER — Emergency Department (HOSPITAL_COMMUNITY): Payer: PRIVATE HEALTH INSURANCE

## 2011-09-02 ENCOUNTER — Emergency Department (HOSPITAL_COMMUNITY)
Admission: EM | Admit: 2011-09-02 | Discharge: 2011-09-02 | Disposition: A | Discharge status: Home / Self Care | Payer: PRIVATE HEALTH INSURANCE | Attending: Emergency Medicine | Admitting: Emergency Medicine

## 2011-09-02 DIAGNOSIS — R110 Nausea: Secondary | ICD-10-CM | POA: Insufficient documentation

## 2011-09-02 DIAGNOSIS — F411 Generalized anxiety disorder: Secondary | ICD-10-CM | POA: Insufficient documentation

## 2011-09-02 DIAGNOSIS — R109 Unspecified abdominal pain: Secondary | ICD-10-CM

## 2011-09-02 DIAGNOSIS — E119 Type 2 diabetes mellitus without complications: Secondary | ICD-10-CM | POA: Insufficient documentation

## 2011-09-02 DIAGNOSIS — F319 Bipolar disorder, unspecified: Secondary | ICD-10-CM | POA: Insufficient documentation

## 2011-09-02 DIAGNOSIS — M7989 Other specified soft tissue disorders: Secondary | ICD-10-CM | POA: Insufficient documentation

## 2011-09-02 DIAGNOSIS — Z8719 Personal history of other diseases of the digestive system: Secondary | ICD-10-CM | POA: Insufficient documentation

## 2011-09-02 LAB — POCT I-STAT, CHEM 8
BUN: 7 mg/dL (ref 6–23)
Calcium, Ion: 1.22 mmol/L (ref 1.12–1.32)
Chloride: 103 mEq/L (ref 96–112)
Creatinine, Ser: 0.6 mg/dL (ref 0.50–1.10)
Glucose, Bld: 100 mg/dL — ABNORMAL HIGH (ref 70–99)
HCT: 37 % (ref 36.0–46.0)
Hemoglobin: 12.6 g/dL (ref 12.0–15.0)
Potassium: 3.4 mEq/L — ABNORMAL LOW (ref 3.5–5.1)
Sodium: 142 mEq/L (ref 135–145)
TCO2: 24 mmol/L (ref 0–100)

## 2011-09-02 LAB — URINALYSIS, ROUTINE W REFLEX MICROSCOPIC
Bilirubin Urine: NEGATIVE
Glucose, UA: NEGATIVE mg/dL
Hgb urine dipstick: NEGATIVE
Ketones, ur: NEGATIVE mg/dL
Leukocytes, UA: NEGATIVE
Nitrite: NEGATIVE
Protein, ur: NEGATIVE mg/dL
Specific Gravity, Urine: 1.025 (ref 1.005–1.030)
Urobilinogen, UA: 0.2 mg/dL (ref 0.0–1.0)
pH: 6.5 (ref 5.0–8.0)

## 2011-09-02 MED ORDER — MORPHINE SULFATE 4 MG/ML IJ SOLN
4.0000 mg | INTRAMUSCULAR | Status: AC | PRN
  Administered 2011-09-02 (×2): 4 mg via INTRAVENOUS
  Filled 2011-09-02 (×2): qty 1

## 2011-09-02 MED ORDER — PROMETHAZINE HCL 25 MG/ML IJ SOLN
12.5000 mg | Freq: Once | INTRAMUSCULAR | Status: DC
  Filled 2011-09-02 (×2): qty 1

## 2011-09-02 MED ORDER — OXYCODONE-ACETAMINOPHEN 5-325 MG PO TABS: ORAL_TABLET | ORAL | Status: AC

## 2011-09-02 MED ORDER — SODIUM CHLORIDE 0.9 % IV SOLN
Freq: Once | INTRAVENOUS | Status: AC
  Administered 2011-09-02: 1000 mL via INTRAVENOUS

## 2011-09-02 MED ORDER — HYDROMORPHONE HCL PF 1 MG/ML IJ SOLN
1.0000 mg | Freq: Once | INTRAMUSCULAR | Status: AC
  Administered 2011-09-02: 1 mg via INTRAVENOUS
  Filled 2011-09-02: qty 1

## 2011-09-02 MED ORDER — POTASSIUM CHLORIDE 20 MEQ/15ML (10%) PO LIQD
40.0000 meq | Freq: Once | ORAL | Status: AC
  Administered 2011-09-02: 40 meq via ORAL
  Filled 2011-09-02: qty 30

## 2011-09-02 MED ORDER — LORAZEPAM 1 MG PO TABS
1.0000 mg | ORAL_TABLET | Freq: Once | ORAL | Status: AC
  Administered 2011-09-02: 1 mg via ORAL
  Filled 2011-09-02: qty 1

## 2011-09-02 MED ORDER — DIPHENHYDRAMINE HCL 50 MG/ML IJ SOLN
25.0000 mg | Freq: Once | INTRAMUSCULAR | Status: AC
  Administered 2011-09-02: 19:00:00 via INTRAVENOUS
  Filled 2011-09-02: qty 2

## 2011-09-02 NOTE — ED Notes (Signed)
Pt tearful, having panic attack.  Dr. Clarene Duke aware and is in room with pt.

## 2011-09-02 NOTE — ED Notes (Signed)
abd pain, low abd,  Seen here on Saturday for these sx and was d/c.  Since then cont to have abd pain and now says her legs are swollen.  Alert,   Feels sob.that is intermittent.  Nausea , no vomiting,  No diarrhea. No fever.    Pain in thighs

## 2011-09-02 NOTE — Discharge Instructions (Signed)
RESOURCE GUIDE  Chronic Pain Problems: Contact [facility]  [phone] Patients need to be referred by their primary care doctor.  Insufficient Money for Medicine: Contact United Way:  call "211" or [facility] [phone].  No Primary Care Doctor: - Call [facility]  [phone] - can help you locate a primary care doctor that  accepts your insurance, provides certain services, etc. - Physician Referral Service- 1-800-533-3463  Agencies that provide inexpensive medical care: - [facility]  [phone] - [facility]  [phone] - [facility]  [phone] - [facility]  [phone] - Planned Parenthood  [phone] - [facility]  [phone]  Medicaid-accepting [place] Providers: - [facility]- [address]  [phone], Mon-Fri 9am-7pm, Sat 9am-1pm - [facility]- [address], [address]  [phone] - [facility]- [address], [address]  [phone] - [facility]- [address] [place] [address]  [phone] - Veita Bland- [address], [phone]  Only accepts [place] Access Medicaid patients after they have their name  applied to their card  Self Pay (no insurance) in [place]: - Sickle Cell Patients: Dr Eric Dean, [facility]  [address], [phone] - [facility]- [address]  [phone]       -     [facility] [place]- [address]       -     [facility]- see information above (Speak to Pam H if you do not have insurance)       -  [facility]- [address], [phone]       -  [facility] [place]- [address],  [phone]       -  [facility]- [address] [place] [address], [phone]       -  Dr Osei-Bonsu-  [address], [place], [phone]       -  [facility]- [address], [phone]       -  [facility]- [address] [place] [address], [phone], also [address], [phone]       -    [facility]- [address], [phone], 1st & 3rd Saturday   every month, 10am-1pm  1) Find a Doctor and Pay Out of Pocket Although you won't have to find out who is covered by your insurance plan, it is a good idea to ask around and get recommendations. You will then need to call the office and see if the doctor you have chosen will accept you as a new patient and what types of options they offer for patients who are self-pay. Some doctors offer discounts or will set up payment plans for their patients who do not have insurance, but you will need to ask so you aren't surprised when you get to your appointment.  2) Contact Your Local Health Department Not all health departments have doctors that can see patients for sick visits, but many do, so it is worth a call to see if yours does. If you don't know where your local health department is, you can check in your phone book. The CDC also has a tool to help you locate your state's health department, and many state websites also have   listings of all of their local health departments.  3) Find a Walk-in Clinic If your illness is not likely to be very severe or complicated, you may want to try a walk in clinic. These are popping up all over the country in pharmacies, drugstores, and shopping centers. They're usually staffed by nurse practitioners or physician assistants that have been trained to treat common illnesses and complaints. They're usually fairly quick and inexpensive. However, if you have serious medical issues or chronic medical problems, these are probably not your best option  STD Testing - [place] [facility] [place], [facility] Clinic, [address], [place], phone [phone] or [phone].  Monday - Friday, call for an appointment. - [place]  [facility] [place], STD Clinic, [address], [place], phone [phone] or [phone].  Monday - Friday, call for an appointment.  Abuse/Neglect: - [place] Child Abuse Hotline (336) 641-3795 - [place] Child Abuse Hotline 800-378-5315 (After Hours)  Emergency Shelter:  [facility] (336) 271-5985  Maternity Homes: - [facility] (336) 275-9566 - [facility] (704) 372-4663  MRSA Hotline #:   832-7006  [place] Resources  [facility] [place]  [employer] [place] [facility] [address]                 [address]         [address]  [place]                                               [place]                              [place] Phone:  [phone]                                  Phone:  [phone]                   Phone:  [phone]  [place] [facility], [phone] - [place] Services - [facility]- 1-888-581-9988       -     [facility] in [place], [address],                                  336-349-4454, Insurance  [place] Child Abuse Hotline (336) 342-1394 or (336) 342-3537 (After Hours)   Behavioral Health Services  Substance Abuse Resources: - [facility]  336-882-2125 - [facility] 336-784-9470 - [facility] 336-285-9073 - [facility] 336-845-3988 - Residential & Outpatient Substance Abuse Program  800-659-3381  Psychological Services: -  Health  832-9600 - [facility]  [phone] - [place] [facility], [address], [place], ACCESS LINE: 1-800-853-5163 or 336-641-4981, Http://www.[facility].com/services/adult.htm  Dental Assistance  If unable to pay or uninsured, contact:  [facility] or [place] [facility] to become qualified for the adult dental  clinic.  Patients with Medicaid:  [facility] [address], [phone], [phone]  If unable   to pay, or uninsured, contact [facility] 845-411-4563) or [facility] [phone] in [place], [phone] in [facility]) to become qualified for the adult dental clinic  Other Low-Cost Community Dental Services: - [facility]- [address] [place], [place], [place], [address], [phone], [phone], 2nd and 4th Thursday of the month at 6:30am.  10 clients each day by appointment, can sometimes see walk-in patients if someone does not show for an appointment. [facility]- [address] [place], [place], [address], [phone] - [facility]- [address] [place], [place], [address], [phone] [facility]- [phone] [facility]- [phone] [facility] [phone]     Take the prescription as directed.  Go to your regular medical doctor tomorrow at 2pm to be seen in follow up.  Dr. Gerda Diss is expecting to see you tomorrow.  Return to the Emergency Department immediately sooner if worsening.

## 2011-09-02 NOTE — ED Notes (Signed)
Pt says morphine will not help her pain.  Notified Dr. Clarene Duke.

## 2011-09-02 NOTE — ED Provider Notes (Signed)
History     CSN: [id number]  Arrival date & time 09/02/11  1454   First MD Initiated Contact with Patient 09/02/11 1647      Chief Complaint  Patient presents with  . Abdominal Pain    HPI Pt was seen at 1655.  Per pt, c/o gradual onset and persistence of constant lower abd "pain" and nausea for the past 2 days.  Pt states her bilat LE's "began to swell" today.  Pt was eval in the ED yesterday for her abd pain with negative extensive work up.  Denies any change in her abd pain from yesterday.  Has been taking hydrocodone without relief. Denies CP/SOB, no back pain, no dysuria/hematuria, no vomiting/diarrhea, no vaginal bleeding/discharge, no fevers.       Past Medical History  Diagnosis Date  . Breast infection 04/04/2010    hospitalized, given antibiotics, referred to surgeon  . Bipolar disorder   . Vaginitis 04/04/2010  . Anemia   . Migraine headache   . Hemorrhoids, internal   . Obesity   . Diabetes mellitus   . Colonic adenoma   . Helicobacter pylori (H. pylori)     s/p treatment  . Anxiety   . Complication of anesthesia   . PONV (postoperative nausea and vomiting)   . Diverticulitis   . Chronic pain     Past Surgical History  Procedure Date  . Cholecystectomy   . Tubal ligation   . Appendectomy 2008    lower abd pain  . S/p hysterectomy 08/2007    No Cancer Cells  . Breast reduction surgery 08/2008    from a 44 DDD to a 44 C  . Bilateral salpingoophorectomy 06/2009  . Tumor removal     from left shoulder  . Colonoscopy 01/04/10    tubular adenoma of cecum, normal TI, patchy fibrotic changes involving sigm, desc s/p bx neg. Next TCS 12/2016  . Esophagogastroduodenoscopy 01/04/10    normal esophagus s/p dilation, small hh, H pylori gastritis  . Esophagogastroduodenoscopy 2008    florid gastroesophageal reflux during exam but normal mucosa  . Abdominal hysterectomy   . Wisdom tooth extraction     Family History  Problem Relation Age of Onset  . Cancer  Mother     cervical, deceased age 28  . Cancer Father     lung cancer, deceased age 9  . Anesthesia problems Neg Hx   . Hypotension Neg Hx   . Malignant hyperthermia Neg Hx   . Pseudochol deficiency Neg Hx     History  Substance Use Topics  . Smoking status: Never Smoker   . Smokeless tobacco: Never Used  . Alcohol Use: No    Review of Systems ROS: Statement: All systems negative except as marked or noted in the HPI; Constitutional: Negative for fever and chills. ; ; Eyes: Negative for eye pain, redness and discharge. ; ; ENMT: Negative for ear pain, hoarseness, nasal congestion, sinus pressure and sore throat. ; ; Cardiovascular: Negative for chest pain, palpitations, diaphoresis, dyspnea and +peripheral edema. ; ; Respiratory: Negative for cough, wheezing and stridor. ; ; Gastrointestinal: +abd pain. Negative for nausea, vomiting, diarrhea, blood in stool, hematemesis, jaundice and rectal bleeding.; ; Genitourinary: Negative for dysuria, flank pain and hematuria.; GYN:  No vaginal bleeding, no vaginal discharge, no vulvar pain.;; Musculoskeletal: Negative for back pain and neck pain. Negative for trauma.; ; Skin: Negative for pruritus, rash, abrasions, blisters, bruising and skin lesion.; ; Neuro: Negative for headache, lightheadedness and neck stiffness.  Negative for weakness, altered level of consciousness , altered mental status, extremity weakness, paresthesias, involuntary movement, seizure and syncope.     Allergies  Tomato; Ketorolac tromethamine; Nalbuphine; Tramadol hcl; and Zofran  Home Medications   Current Outpatient Rx  Name Route Sig Dispense Refill  . ALPRAZOLAM 1 MG PO TABS Oral Take 1 tablet by mouth 3 (three) times daily as needed.     Marland Kitchen DOXYCYCLINE HYCLATE 100 MG PO CAPS Oral Take 1 capsule (100 mg total) by mouth 2 (two) times daily. 14 capsule 0  . DOXYCYCLINE HYCLATE 100 MG PO CAPS Oral Take 1 capsule (100 mg total) by mouth 2 (two) times daily. 14 capsule 0  .  HYDROCODONE-ACETAMINOPHEN 7.5-325 MG PO TABS Oral Take 1 tablet by mouth every 8 (eight) hours as needed. As needed for back pain but doesn't take regularly.    Marland Kitchen HYDROCODONE-ACETAMINOPHEN 7.5-325 MG PO TABS Oral Take 1 tablet by mouth every 6 (six) hours as needed for pain. 20 tablet 0  . HYDROCODONE-ACETAMINOPHEN 7.5-325 MG PO TABS Oral Take 1 tablet by mouth every 6 (six) hours as needed for pain. 20 tablet 0  . POTASSIUM CHLORIDE ER 10 MEQ PO TBCR Oral Take 2 tablets (20 mEq total) by mouth 2 (two) times daily. 10 tablet 0  . PROMETHAZINE HCL 25 MG PO TABS Oral Take 1 tablet (25 mg total) by mouth every 6 (six) hours as needed for nausea. 12 tablet 0  . PROMETHAZINE HCL 25 MG PO TABS Oral Take 1 tablet (25 mg total) by mouth every 6 (six) hours as needed for nausea. 12 tablet 0  . PROMETHAZINE HCL 25 MG PO TABS Oral Take 1 tablet (25 mg total) by mouth every 6 (six) hours as needed for nausea. 30 tablet 0  . VENLAFAXINE HCL 75 MG PO TABS Oral Take 75 mg by mouth 3 (three) times daily.    Marland Kitchen VERAPAMIL HCL ER (CO) 240 MG PO TB24 Oral Take 240 mg by mouth daily.    Marland Kitchen ZOLPIDEM TARTRATE 10 MG PO TABS Oral Take 10 mg by mouth at bedtime.      BP 121/90  Pulse 88  Temp 98.2 F (36.8 C) (Oral)  Resp 20  Ht 5\' 6"  (1.676 m)  Wt 239 lb (108.41 kg)  BMI 38.58 kg/m2  SpO2 97%  Physical Exam 1700: Physical examination:  Nursing notes reviewed; Vital signs and O2 SAT reviewed;  Constitutional: Well developed, Well nourished, Well hydrated, Labile; Head:  Normocephalic, atraumatic; Eyes: EOMI, PERRL, No scleral icterus; ENMT: Mouth and pharynx normal, Mucous membranes moist; Neck: Supple, Full range of motion; Cardiovascular: Regular rate and rhythm, No gallop; Respiratory: Breath sounds clear & equal bilaterally, No wheezes.  Speaking full sentences with ease, Initially hyperventilating, then normal respiratory effort/excursion; Chest: Nontender, Movement normal; Abdomen: Soft, Nontender, Nondistended,  Normal bowel sounds;; Extremities: Pulses normal, No tenderness, tr to 1+ pedal edema bilat, No calf edema or asymmetry.; Neuro: AA&Ox3, Major CN grossly intact.  Speech clear. No gross focal motor or sensory deficits in extremities.; Skin: Color normal, Warm, Dry; Psych:  Emotionally labile. .   ED Course  Procedures  1715:  On my arrival to ED exam room, pt was hyperventilating and having her family fanning her.  After I examined her, I reviewed her extensive workup yesterday morning with her, including her urine test, wet prep, GC/chlam (pending results), all labs, and CT scan.  I explained that I would not be performing the same testing today, as her  pain has not changed, and the risk/benefit to exposing her to further IV contrast and radiation would not be appropriate at this time.  Her abd is also NT on exam when she is distracted.  Pt then sat straight up and began screaming at multiple ED staff (myself, ED RN, Hospitality staff, ED Tech), stating that she is in "excrutiating pain" and "no one is paying attention to me."  States she is being "disrespected" because she had to wait in the waiting room and still has not gotten any pain medications.  Calling staff "bad" because we "aren't figuring out what is wrong with me" wanting to know why yesterday "a blood clot test wasn't done" and that she wants one today.  Tried to explain the non-specificity of d-dimer, when she interrupted me demanding an [place] of her bilat LE's "because my friend who is a nurse told me I needed these things done for my leg swelling."  After I verified that the Vasc US Tech was still here and was willing to perform the [place] she is requesting, I told the pt.  She then began screaming again that staff was "disrespecting" her because "no one has examined me" and "no one's looked in my ears," that "I'll be a better patient when you people act better" and that I needed to stand in front of her and listen to her yell at me.  Pt then stated  that she "needs to be admitted to the hospital for my excruciating pain and the swelling in my legs."  I informed her that she does not meet criteria to be admitted to the hospital.  EPIC chart review reveals pt has had these complaints intermittently over the past several years, with negative CT-A chest for PE, multiple CT, MRI and [place] of abd, negative LE Vascular US for DVT; as well as had multiple ED visits for various chronic pain complaints.  For today, [place] bilat LE's, I-stat chem, UA/UC, and AXR ordered.     1730:  Pt apparently told ED RN that morphine will not help her pain and she "needs something else" in addition to some ativan for "her nerves."    1945:  Pt has had multiple doses of IV morphine, dilaudid, phenergan and benadryl.  Pt is now very calm and cooperative, apologizing to multiple ED staff for her previous behavior.  Has been laughing, smiling and talking on her cellphone, watching TV, and talking with multiple family members at bedside.  Appears comfortable, NAD, resps easy.  VSS.  States she is ready to go home now.  T/C to Dr. Lubertha South (pt's PMD), case discussed, including:  HPI, pertinent PM/SHx, VS/PE, dx testing, ED course and treatment:  Pt is well known to him, emotional lability exhibited today is per pt's baseline (she has hx of mental illness), he requests to have pt f/u in office tomorrow at 2pm.  Dx testing, as well as D/W Dr. Gerda Diss, d/w pt and family.  Questions answered.  Verb understanding, agreeable to d/c home with outpt f/u with Dr. Gerda Diss tomorrow at 2pm.  Pt is requesting "something stronger than the hydrocodone 7.5mg " because she is "taking 2 of them at a time and they don't help" and asks for "some percocet."       MDM  MDM Reviewed: nursing note, vitals and previous chart Reviewed previous: labs and CT scan Interpretation: labs and ultrasound   Results for orders placed during the hospital encounter of 09/01/11  URINALYSIS, ROUTINE W REFLEX MICROSCOPIC  Component Value Range   Color, Urine YELLOW  YELLOW   APPearance CLEAR  CLEAR   Specific Gravity, Urine 1.025  1.005 - 1.030   pH 6.5  5.0 - 8.0   Glucose, UA NEGATIVE  NEGATIVE mg/dL   Hgb urine dipstick NEGATIVE  NEGATIVE   Bilirubin Urine NEGATIVE  NEGATIVE   Ketones, ur TRACE (*) NEGATIVE mg/dL   Protein, ur NEGATIVE  NEGATIVE mg/dL   Urobilinogen, UA 0.2  0.0 - 1.0 mg/dL   Nitrite NEGATIVE  NEGATIVE   Leukocytes, UA NEGATIVE  NEGATIVE  CBC      Component Value Range   WBC 15.4 (*) 4.0 - 10.5 K/uL   RBC 4.61  3.87 - 5.11 MIL/uL   Hemoglobin 12.2  12.0 - 15.0 g/dL   HCT 40.9  81.1 - 91.4 %   MCV 82.2  78.0 - 100.0 fL   MCH 26.5  26.0 - 34.0 pg   MCHC 32.2  30.0 - 36.0 g/dL   RDW 78.2  95.6 - 21.3 %   Platelets 364  150 - 400 K/uL  DIFFERENTIAL      Component Value Range   Neutrophils Relative 71  43 - 77 %   Lymphocytes Relative 21  12 - 46 %   Monocytes Relative 3  3 - 12 %   Eosinophils Relative 5  0 - 5 %   Basophils Relative 0  0 - 1 %   Band Neutrophils 0  0 - 10 %   Metamyelocytes Relative 0     Myelocytes 0     Promyelocytes Absolute 0     Blasts 0     nRBC 0  0 /100 WBC   Neutro Abs 10.9 (*) 1.7 - 7.7 K/uL   Lymphs Abs 3.2  0.7 - 4.0 K/uL   Monocytes Absolute 0.5  0.1 - 1.0 K/uL   Eosinophils Absolute 0.8 (*) 0.0 - 0.7 K/uL   Basophils Absolute 0.0  0.0 - 0.1 K/uL  COMPREHENSIVE METABOLIC PANEL      Component Value Range   Sodium 135  135 - 145 mEq/L   Potassium 3.9  3.5 - 5.1 mEq/L   Chloride 98  96 - 112 mEq/L   CO2 28  19 - 32 mEq/L   Glucose, Bld 105 (*) 70 - 99 mg/dL   BUN 8  6 - 23 mg/dL   Creatinine, Ser 0.86  0.50 - 1.10 mg/dL   Calcium 9.8  8.4 - 57.8 mg/dL   Total Protein 8.0  6.0 - 8.3 g/dL   Albumin 3.6  3.5 - 5.2 g/dL   AST 24  0 - 37 U/L   ALT 28  0 - 35 U/L   Alkaline Phosphatase 108  39 - 117 U/L   Total Bilirubin 0.2 (*) 0.3 - 1.2 mg/dL   GFR calc non Af Amer >90  >90 mL/min   GFR calc Af Amer >90  >90 mL/min  LIPASE, BLOOD       Component Value Range   Lipase 19  11 - 59 U/L  WET PREP, GENITAL      Component Value Range   Yeast Wet Prep HPF POC NONE SEEN  NONE SEEN   Trich, Wet Prep NONE SEEN  NONE SEEN   Clue Cells Wet Prep HPF POC NONE SEEN  NONE SEEN   WBC, Wet Prep HPF POC FEW (*) NONE SEEN  POCT PREGNANCY, URINE      Component Value Range   Preg Test,  Ur NEGATIVE  NEGATIVE    Ct Abdomen Pelvis W Contrast 09/01/2011  *RADIOLOGY REPORT*  Clinical Data: Abdominal pain and nausea.  CT ABDOMEN AND PELVIS WITH CONTRAST  Technique:  Multidetector CT imaging of the abdomen and pelvis was performed following the standard protocol during bolus administration of intravenous contrast.  Contrast: OMNIPAQUE IOHEXOL 300 MG/ML  SOLN  Comparison: CT of the abdomen and pelvis performed 06/14/2011  Findings: Minimal bibasilar atelectasis is noted.  The liver and spleen are unremarkable in appearance.  The patient is status post cholecystectomy, with clips noted at the gallbladder fossa.  The pancreas and adrenal glands are unremarkable.  The kidneys are unremarkable in appearance.  There is no evidence of hydronephrosis.  No renal or ureteral stones are seen.  No perinephric stranding is appreciated.  No free fluid is identified.  The small bowel is unremarkable in appearance.  The stomach is within normal limits.  No acute vascular abnormalities are seen.  The patient is status post appendectomy.  Contrast progresses to the level of the transverse colon.  The colon is unremarkable in appearance.  The bladder is decompressed and not well assessed.  The patient is status post hysterectomy; no suspicious adnexal masses are seen. No inguinal lymphadenopathy is seen.  No acute osseous abnormalities are identified.  IMPRESSION: No acute abnormalities seen within the abdomen or pelvis.  Original Report Authenticated By: Tonia Ghent, M.D.    Results for orders placed during the hospital encounter of 09/02/11  URINALYSIS, ROUTINE W  REFLEX MICROSCOPIC      Component Value Range   Color, Urine AMBER (*) YELLOW   APPearance CLEAR  CLEAR   Specific Gravity, Urine 1.025  1.005 - 1.030   pH 6.5  5.0 - 8.0   Glucose, UA NEGATIVE  NEGATIVE mg/dL   Hgb urine dipstick NEGATIVE  NEGATIVE   Bilirubin Urine NEGATIVE  NEGATIVE   Ketones, ur NEGATIVE  NEGATIVE mg/dL   Protein, ur NEGATIVE  NEGATIVE mg/dL   Urobilinogen, UA 0.2  0.0 - 1.0 mg/dL   Nitrite NEGATIVE  NEGATIVE   Leukocytes, UA NEGATIVE  NEGATIVE  POCT I-STAT, CHEM 8      Component Value Range   Sodium 142  135 - 145 mEq/L   Potassium 3.4 (*) 3.5 - 5.1 mEq/L   Chloride 103  96 - 112 mEq/L   BUN 7  6 - 23 mg/dL   Creatinine, Ser 1.61  0.50 - 1.10 mg/dL   Glucose, Bld 096 (*) 70 - 99 mg/dL   Calcium, Ion 0.45  4.09 - 1.32 mmol/L   TCO2 24  0 - 100 mmol/L   Hemoglobin 12.6  12.0 - 15.0 g/dL   HCT 81.1  91.4 - 78.2 %    US Venous Img Lower Bilateral 09/02/2011  *RADIOLOGY REPORT*  Clinical Data: Swelling, rule out DVT  VENOUS DUPLEX ULTRASOUND OF BILATERAL LOWER EXTREMITIES  Technique:  Gray-scale sonography with graded compression, as well as color Doppler and duplex ultrasound, were performed to evaluate the deep venous system of both lower extremities from the level of the common femoral vein through the popliteal and proximal calf veins.  Spectral Doppler was utilized to evaluate flow at rest and with distal augmentation maneuvers.  Comparison:  07/30/2010  Findings: Ultrasound examination bilateral lower extremity demonstrate bilateral visualized deep veins to be patent with good augmentation and compressibility noted.  There is no evidence of deep vein thrombosis.  No evidence of superficial vein thrombosis.  IMPRESSION: No evidence  of deep vein thrombosis bilateral lower extremity.  Original Report Authenticated By: Natasha Mead, M.D.   Dg Abd Acute W/chest 09/02/2011  *RADIOLOGY REPORT*  Clinical Data: 31 year old female with abdominal pain, lower extremity swelling.   Recent CT of the abdomen and pelvis.  ACUTE ABDOMEN SERIES (ABDOMEN 2 VIEW & CHEST 1 VIEW)  Comparison: CT abdomen pelvis 09/01/2011 andearlier.  Findings: Lower lung volumes.  Cardiac size and mediastinal contours are within normal limits.  Visualized tracheal air column is within normal limits.  No pneumothorax or pneumoperitoneum. Crowding of markings at the lung bases.  CT contrast in the colon.  Normal bowel gas pattern.  Stable right upper quadrant surgical clips. No acute osseous abnormality identified.  IMPRESSION: 1. Nonobstructed bowel gas pattern, no free air.  CT contrast in the colon. 2.  Low lung volumes.  Original Report Authenticated By: Harley Hallmark, M.D.             Laray Anger, DO 09/05/11 2054

## 2011-09-02 NOTE — ED Notes (Signed)
Patient does not need anything at this time. Family at bedside. 

## 2011-09-02 NOTE — ED Notes (Signed)
Pt requesting medication for panic attack, says medication starts with L.  Notified Dr. Clarene Duke and lorazepam given.  Per Dr. Clarene Duke, pt got upset with her.  Pt requesting to see another doctor.  Notified Dr. Clarene Duke and Dr. Lars Mage.

## 2011-09-02 NOTE — ED Notes (Signed)
Pt c/o lower abd pain since Saturday, denies vomiting but c/o nausea.  Also reports noticed swelling in lowe legs, left greater than right.  Pedal pulses palpable.

## 2011-09-03 LAB — URINE CULTURE
Colony Count: 40000
Culture  Setup Time: 201306250156

## 2011-09-03 LAB — GC/CHLAMYDIA PROBE AMP, GENITAL
Chlamydia, DNA Probe: NEGATIVE
GC Probe Amp, Genital: NEGATIVE

## 2011-09-04 ENCOUNTER — Telehealth: Payer: Self-pay | Admitting: Internal Medicine

## 2011-09-04 NOTE — Telephone Encounter (Signed)
Please advise a day and time. RMR talked to patient last night and was told if she doesn't feel any better today to call office to make appointment to be seen today. Patient has abdominal pain.

## 2011-09-04 NOTE — Telephone Encounter (Signed)
Patient is aware of OV for Friday 6/28 @ 0930 with KJ. She has concerns about being diabetic, not being able to eat and wants to know if RMR will admit her to control her pain. I told patient if anything because available before Friday that I would call her. Please call pt about her concerns at [phone]

## 2011-09-04 NOTE — Telephone Encounter (Signed)
Patient called me after hours last night. Complaining of abdominal pain. Needs appointment to come see [place]. States she's been to the emergency department 2 times in last week with a with negative workup.

## 2011-09-04 NOTE — Telephone Encounter (Signed)
I have no idea about date and time. You have been schedule.  Would make it next available with one of the extenders, please

## 2011-09-04 NOTE — Telephone Encounter (Signed)
Spoke with pt- informed her that we do not admit anyone straight from the office, told her that we send our patients to the ED if they need to be admitted. Pt verbalized understanding and she is aware of her appt on Friday.

## 2011-09-06 ENCOUNTER — Telehealth: Payer: Self-pay | Admitting: Internal Medicine

## 2011-09-06 ENCOUNTER — Ambulatory Visit: Payer: PRIVATE HEALTH INSURANCE | Admitting: Urgent Care

## 2011-09-06 ENCOUNTER — Encounter: Payer: Self-pay | Admitting: Internal Medicine

## 2011-09-06 NOTE — Telephone Encounter (Signed)
Pt was a no show

## 2011-09-06 NOTE — Telephone Encounter (Signed)
Noted, see Dr. Luvenia Starch letter.

## 2011-09-11 ENCOUNTER — Telehealth: Payer: Self-pay | Admitting: General Practice

## 2011-09-11 NOTE — Telephone Encounter (Signed)
I received a voicemail from [place] on yesterday.  I tried to call her back, no answer.

## 2011-09-14 ENCOUNTER — Encounter (HOSPITAL_COMMUNITY): Payer: Self-pay

## 2011-09-14 ENCOUNTER — Emergency Department (HOSPITAL_COMMUNITY)
Admission: EM | Admit: 2011-09-14 | Discharge: 2011-09-14 | Disposition: A | Discharge status: Home / Self Care | Payer: PRIVATE HEALTH INSURANCE | Attending: Emergency Medicine | Admitting: Emergency Medicine

## 2011-09-14 DIAGNOSIS — E119 Type 2 diabetes mellitus without complications: Secondary | ICD-10-CM | POA: Insufficient documentation

## 2011-09-14 DIAGNOSIS — F319 Bipolar disorder, unspecified: Secondary | ICD-10-CM | POA: Insufficient documentation

## 2011-09-14 DIAGNOSIS — N390 Urinary tract infection, site not specified: Secondary | ICD-10-CM

## 2011-09-14 DIAGNOSIS — Z79899 Other long term (current) drug therapy: Secondary | ICD-10-CM | POA: Insufficient documentation

## 2011-09-14 DIAGNOSIS — R109 Unspecified abdominal pain: Secondary | ICD-10-CM | POA: Insufficient documentation

## 2011-09-14 DIAGNOSIS — M79609 Pain in unspecified limb: Secondary | ICD-10-CM | POA: Insufficient documentation

## 2011-09-14 LAB — COMPREHENSIVE METABOLIC PANEL
ALT: 27 U/L (ref 0–35)
AST: 20 U/L (ref 0–37)
Albumin: 3.1 g/dL — ABNORMAL LOW (ref 3.5–5.2)
Alkaline Phosphatase: 97 U/L (ref 39–117)
BUN: 7 mg/dL (ref 6–23)
CO2: 26 mEq/L (ref 19–32)
Calcium: 9 mg/dL (ref 8.4–10.5)
Chloride: 103 mEq/L (ref 96–112)
Creatinine, Ser: 0.61 mg/dL (ref 0.50–1.10)
GFR calc Af Amer: >90 mL/min (ref >90)
GFR calc non Af Amer: >90 mL/min (ref >90)
Glucose, Bld: 123 mg/dL — ABNORMAL HIGH (ref 70–99)
Potassium: 3.3 mEq/L — ABNORMAL LOW (ref 3.5–5.1)
Sodium: 138 mEq/L (ref 135–145)
Total Bilirubin: 0.2 mg/dL — ABNORMAL LOW (ref 0.3–1.2)
Total Protein: 7.1 g/dL (ref 6.0–8.3)

## 2011-09-14 LAB — PREGNANCY, URINE: Preg Test, Ur: NEGATIVE

## 2011-09-14 LAB — CBC
HCT: 35.4 % — ABNORMAL LOW (ref 36.0–46.0)
Hemoglobin: 11.1 g/dL — ABNORMAL LOW (ref 12.0–15.0)
MCH: 25.6 pg — ABNORMAL LOW (ref 26.0–34.0)
MCHC: 31.4 g/dL (ref 30.0–36.0)
MCV: 81.8 fL (ref 78.0–100.0)
Platelets: 370 10*3/uL (ref 150–400)
RBC: 4.33 MIL/uL (ref 3.87–5.11)
RDW: 14.2 % (ref 11.5–15.5)
WBC: 11.4 10*3/uL — ABNORMAL HIGH (ref 4.0–10.5)

## 2011-09-14 LAB — URINE MICROSCOPIC-ADD ON

## 2011-09-14 LAB — URINALYSIS, ROUTINE W REFLEX MICROSCOPIC
Bilirubin Urine: NEGATIVE
Glucose, UA: NEGATIVE mg/dL
Hgb urine dipstick: NEGATIVE
Ketones, ur: NEGATIVE mg/dL
Nitrite: POSITIVE — AB
Protein, ur: 30 mg/dL — AB
Specific Gravity, Urine: 1.03 (ref 1.005–1.030)
Urobilinogen, UA: 1 mg/dL (ref 0.0–1.0)
pH: 5.5 (ref 5.0–8.0)

## 2011-09-14 LAB — LIPASE, BLOOD: Lipase: 31 U/L (ref 11–59)

## 2011-09-14 MED ORDER — HYDROMORPHONE HCL PF 1 MG/ML IJ SOLN
1.0000 mg | Freq: Once | INTRAMUSCULAR | Status: AC
  Administered 2011-09-14: 1 mg via INTRAVENOUS
  Filled 2011-09-14: qty 1

## 2011-09-14 MED ORDER — DIPHENHYDRAMINE HCL 50 MG/ML IJ SOLN
25.0000 mg | Freq: Once | INTRAMUSCULAR | Status: AC
  Administered 2011-09-14: 25 mg via INTRAVENOUS
  Filled 2011-09-14: qty 1

## 2011-09-14 MED ORDER — SODIUM CHLORIDE 0.9 % IV SOLN
Freq: Once | INTRAVENOUS | Status: AC
  Administered 2011-09-14: 08:00:00 via INTRAVENOUS

## 2011-09-14 MED ORDER — ONDANSETRON HCL 4 MG/2ML IJ SOLN
4.0000 mg | Freq: Once | INTRAMUSCULAR | Status: AC
  Administered 2011-09-14: 4 mg via INTRAVENOUS
  Filled 2011-09-14: qty 2

## 2011-09-14 MED ORDER — DEXTROSE 5 % IV SOLN
1.0000 g | Freq: Once | INTRAVENOUS | Status: AC
  Administered 2011-09-14: 1 g via INTRAVENOUS
  Filled 2011-09-14: qty 10

## 2011-09-14 MED ORDER — CEPHALEXIN 500 MG PO CAPS: 500.0000 mg | ORAL_CAPSULE | Freq: Four times a day (QID) | ORAL | Status: DC

## 2011-09-14 NOTE — ED Notes (Signed)
Pt in from home with abd pain/leg pain states onset x2 weeks was recently seen at [facility] for same sx with no relief states nausea/denies v/d; states pain is lower abd feels like a lot of pressure

## 2011-09-14 NOTE — ED Notes (Signed)
MD at bedside. 

## 2011-09-14 NOTE — ED Provider Notes (Signed)
History     CSN: [id number]  Arrival date & time 09/14/11  4098   First MD Initiated Contact with Patient 09/14/11 0732      Chief Complaint  Patient presents with  . Abdominal Pain  . Leg Pain    (Consider location/radiation/quality/duration/timing/severity/associated sxs/prior treatment) HPI Pt presents with c/o suprapubic abdominal pain.  Pt states she was evaluated for this pain a few weeks ago, both at [facility] and at [facility].  She states that she followed up with her primary doctor and that she is working on getting followup with her GI physician.  No fever/chills, pain is sharp/cramping and intermittent in nature.  No vomiting.  She denies dysuria.  Per prior chart review she has had labs, ct scan, pelvic exam recently for similar symptoms. There are no other associated systemic symptoms, there are no other alleviating or modifying factors.   Past Medical History  Diagnosis Date  . Breast infection 04/04/2010    hospitalized, given antibiotics, referred to surgeon  . Bipolar disorder   . Vaginitis 04/04/2010  . Anemia   . Migraine headache   . Hemorrhoids, internal   . Obesity   . Diabetes mellitus   . Colonic adenoma   . Helicobacter pylori (H. pylori)     s/p treatment  . Anxiety   . Complication of anesthesia   . PONV (postoperative nausea and vomiting)   . Diverticulitis   . Chronic pain     Past Surgical History  Procedure Date  . Cholecystectomy   . Tubal ligation   . Appendectomy 2008    lower abd pain  . S/p hysterectomy 08/2007    No Cancer Cells  . Breast reduction surgery 08/2008    from a 44 DDD to a 44 C  . Bilateral salpingoophorectomy 06/2009  . Tumor removal     from left shoulder  . Colonoscopy 01/04/10    tubular adenoma of cecum, normal TI, patchy fibrotic changes involving sigm, desc s/p bx neg. Next TCS 12/2016  . Esophagogastroduodenoscopy 01/04/10    normal esophagus s/p dilation, small hh, H pylori gastritis  .  Esophagogastroduodenoscopy 2008    florid gastroesophageal reflux during exam but normal mucosa  . Abdominal hysterectomy   . Wisdom tooth extraction     Family History  Problem Relation Age of Onset  . Cancer Mother     cervical, deceased age 2  . Cancer Father     lung cancer, deceased age 59  . Anesthesia problems Neg Hx   . Hypotension Neg Hx   . Malignant hyperthermia Neg Hx   . Pseudochol deficiency Neg Hx     History  Substance Use Topics  . Smoking status: Never Smoker   . Smokeless tobacco: Never Used  . Alcohol Use: No    OB History    Grav Para Term Preterm Abortions TAB SAB Ect Mult Living                  Review of Systems ROS reviewed and all otherwise negative except for mentioned in HPI  Allergies  Tomato; Ketorolac tromethamine; Nalbuphine; Tramadol hcl; Zofran; and Aspirin  Home Medications   Current Outpatient Rx  Name Route Sig Dispense Refill  . ALPRAZOLAM 1 MG PO TABS Oral Take 1 mg by mouth 3 (three) times daily as needed. For anxiety    . POTASSIUM CHLORIDE CRYS ER 20 MEQ PO TBCR Oral Take 20 mEq by mouth every other day.    Marland Kitchen  PROMETHAZINE HCL 25 MG PO TABS Oral Take 25 mg by mouth every 6 (six) hours as needed. For nausea    . VENLAFAXINE HCL 75 MG PO TABS Oral Take 75-150 mg by mouth 2 (two) times daily. **Take two tablets in the morning and take one tablet at bedtime**    . VERAPAMIL HCL ER (CO) 240 MG PO TB24 Oral Take 240 mg by mouth every morning.     Marland Kitchen ZOLPIDEM TARTRATE 10 MG PO TABS Oral Take 10 mg by mouth at bedtime.    . CEPHALEXIN 500 MG PO CAPS Oral Take 1 capsule (500 mg total) by mouth 4 (four) times daily. 40 capsule 0    BP 102/62  Pulse 92  Temp 98 F (36.7 C) (Oral)  Resp 18  SpO2 100% Vitals reviewed Physical Exam Physical Examination: General appearance - alert, well appearing, and in no distress Mental status - alert, oriented to person, place, and time Eyes - pupils equal and reactive, no scleral icterus, no  conjunctival injection Mouth - mucous membranes moist, pharynx normal without lesions Chest - clear to auscultation, no wheezes, rales or rhonchi, symmetric air entry Heart - normal rate, regular rhythm, normal S1, S2, no murmurs, rubs, clicks or gallops Abdomen - soft, nontender to palpation- pt points to middle lower abdomen but nontender to exam, nondistended, no masses or organomegaly, nabs Back exam - full range of motion, no tenderness, no CVA tenderness Musculoskeletal - no joint tenderness, deformity or swelling Extremities - peripheral pulses normal, no pedal edema, no clubbing or cyanosis Skin - normal coloration and turgor, no rashes Psych- normal mood and affect, pleasant and cooperative ED Course  Procedures (including critical care time)  Labs Reviewed  URINALYSIS, ROUTINE W REFLEX MICROSCOPIC - Abnormal; Notable for the following:    APPearance CLOUDY (*)     Protein, ur 30 (*)     Nitrite POSITIVE (*)     Leukocytes, UA MODERATE (*)     All other components within normal limits  CBC - Abnormal; Notable for the following:    WBC 11.4 (*)     Hemoglobin 11.1 (*)     HCT 35.4 (*)     MCH 25.6 (*)     All other components within normal limits  COMPREHENSIVE METABOLIC PANEL - Abnormal; Notable for the following:    Potassium 3.3 (*)     Glucose, Bld 123 (*)     Albumin 3.1 (*)     Total Bilirubin 0.2 (*)     All other components within normal limits  URINE MICROSCOPIC-ADD ON - Abnormal; Notable for the following:    Bacteria, UA MANY (*)     All other components within normal limits  PREGNANCY, URINE  LIPASE, BLOOD  URINE CULTURE   No results found.   1. Urinary tract infection       MDM  Pt with suprapubic discomfort.  She has had extensive workup recently for same.  Urine today c/w UTI.  Pt started on antibitoics, pain controlled in the ED with IV meds.  Pt verbalized understanding of diagnosis and was discharged with strict return precautions.    Prior  records reviewed and considered during this visit         Ethelda Chick, MD 09/15/11 1014

## 2011-09-14 NOTE — ED Notes (Signed)
Pt reports having lower abdominal pain with nausea x2 weeks. States she has been seen several times for the same issue without any results. NAD noted at this time.

## 2011-09-16 ENCOUNTER — Telehealth: Payer: Self-pay | Admitting: General Practice

## 2011-09-16 LAB — URINE CULTURE: Colony Count: >=100000

## 2011-09-16 NOTE — Telephone Encounter (Signed)
This patient called and wanted to question the d/c letter that was sent to her.  I spoke with her and let her know the reason why that letter was sent.  She stated she wanted to speak with Dr. Jena Gauss about this.  Please advise?

## 2011-09-17 ENCOUNTER — Telehealth: Payer: Self-pay | Admitting: Internal Medicine

## 2011-09-17 ENCOUNTER — Encounter (HOSPITAL_COMMUNITY): Payer: Self-pay | Admitting: *Deleted

## 2011-09-17 ENCOUNTER — Emergency Department (HOSPITAL_COMMUNITY)
Admission: EM | Admit: 2011-09-17 | Discharge: 2011-09-17 | Disposition: A | Discharge status: Home / Self Care | Payer: PRIVATE HEALTH INSURANCE | Attending: Emergency Medicine | Admitting: Emergency Medicine

## 2011-09-17 DIAGNOSIS — F319 Bipolar disorder, unspecified: Secondary | ICD-10-CM | POA: Insufficient documentation

## 2011-09-17 DIAGNOSIS — E119 Type 2 diabetes mellitus without complications: Secondary | ICD-10-CM | POA: Insufficient documentation

## 2011-09-17 DIAGNOSIS — Z79899 Other long term (current) drug therapy: Secondary | ICD-10-CM | POA: Insufficient documentation

## 2011-09-17 DIAGNOSIS — G8929 Other chronic pain: Secondary | ICD-10-CM | POA: Insufficient documentation

## 2011-09-17 DIAGNOSIS — E669 Obesity, unspecified: Secondary | ICD-10-CM | POA: Insufficient documentation

## 2011-09-17 DIAGNOSIS — Z9089 Acquired absence of other organs: Secondary | ICD-10-CM | POA: Insufficient documentation

## 2011-09-17 DIAGNOSIS — R109 Unspecified abdominal pain: Secondary | ICD-10-CM | POA: Insufficient documentation

## 2011-09-17 LAB — URINALYSIS, ROUTINE W REFLEX MICROSCOPIC
Bilirubin Urine: NEGATIVE
Glucose, UA: NEGATIVE mg/dL
Hgb urine dipstick: NEGATIVE
Ketones, ur: NEGATIVE mg/dL
Leukocytes, UA: NEGATIVE
Nitrite: NEGATIVE
Protein, ur: NEGATIVE mg/dL
Specific Gravity, Urine: 1.021 (ref 1.005–1.030)
Urobilinogen, UA: 0.2 mg/dL (ref 0.0–1.0)
pH: 7 (ref 5.0–8.0)

## 2011-09-17 LAB — POCT I-STAT, CHEM 8
BUN: 10 mg/dL (ref 6–23)
Calcium, Ion: 1.22 mmol/L (ref 1.12–1.23)
Chloride: 102 mEq/L (ref 96–112)
Creatinine, Ser: 0.6 mg/dL (ref 0.50–1.10)
Glucose, Bld: 126 mg/dL — ABNORMAL HIGH (ref 70–99)
HCT: 38 % (ref 36.0–46.0)
Hemoglobin: 12.9 g/dL (ref 12.0–15.0)
Potassium: 3.5 mEq/L (ref 3.5–5.1)
Sodium: 142 mEq/L (ref 135–145)
TCO2: 25 mmol/L (ref 0–100)

## 2011-09-17 MED ORDER — SODIUM CHLORIDE 0.9 % IV BOLUS (SEPSIS)
1000.0000 mL | Freq: Once | INTRAVENOUS | Status: AC
  Administered 2011-09-17: 1000 mL via INTRAVENOUS

## 2011-09-17 MED ORDER — METOCLOPRAMIDE HCL 5 MG/ML IJ SOLN
10.0000 mg | Freq: Once | INTRAMUSCULAR | Status: AC
  Administered 2011-09-17: 10 mg via INTRAVENOUS
  Filled 2011-09-17: qty 2

## 2011-09-17 MED ORDER — DEXAMETHASONE SODIUM PHOSPHATE 10 MG/ML IJ SOLN
10.0000 mg | Freq: Once | INTRAMUSCULAR | Status: AC
  Administered 2011-09-17: 10 mg via INTRAVENOUS
  Filled 2011-09-17: qty 1

## 2011-09-17 MED ORDER — FENTANYL CITRATE 0.05 MG/ML IJ SOLN
50.0000 ug | Freq: Once | INTRAMUSCULAR | Status: AC
  Administered 2011-09-17: 50 ug via INTRAVENOUS
  Filled 2011-09-17: qty 2

## 2011-09-17 MED ORDER — DIPHENHYDRAMINE HCL 50 MG/ML IJ SOLN
25.0000 mg | Freq: Once | INTRAMUSCULAR | Status: AC
  Administered 2011-09-17: 25 mg via INTRAVENOUS
  Filled 2011-09-17: qty 1

## 2011-09-17 NOTE — ED Notes (Signed)
Patient given discharge instructions, information, prescriptions, and diet order. Patient states that they adequately understand discharge information given and to return to ED if symptoms return or worsen.     

## 2011-09-17 NOTE — ED Notes (Signed)
Pt sts she has been having pain x 4 weeks, has seen 2 MD's and here 3 times since it has began. Sts that her PCP is rejecting some treatments so she can not see him any further. Patient sts pain is in her lower abdomen and radiates towards her back.

## 2011-09-17 NOTE — ED Provider Notes (Signed)
History     CSN: [id number]  Arrival date & time 09/17/11  0015   First MD Initiated Contact with Patient 09/17/11 0105      Chief Complaint  Patient presents with  . Abdominal Pain   HPI  History provided by the patient. Patient is a 31 year old female with history of diabetes, obesity, bipolar disorder, pseudoseizures, cholecystectomy, appendectomy, complete hysterectomy presents with complaints of persistent right lower abdomen pain. Patient reports having similar pains have been waxing and waning for the past 4-6 weeks. Patient reports being seen multiple times both in the emergency room and by PCP for these complaints. Most recently patient was seen 2 days ago for these complaints and diagnosed with a urinary tract infection. Patient has been taking Keflex for this but states pain has increased. Pain does not radiate. Patient has not been using any treatments for symptoms besides antibiotics. She denies any aggravating or alleviating factors. Patient denies any other associated symptoms. She denies any fever, chills, sweats, nausea, vomiting, diarrhea, constipation, dysuria, hematuria, urinary frequency, vaginal bleeding or vaginal discharge.    Past Medical History  Diagnosis Date  . Breast infection 04/04/2010    hospitalized, given antibiotics, referred to surgeon  . Bipolar disorder   . Vaginitis 04/04/2010  . Anemia   . Migraine headache   . Hemorrhoids, internal   . Obesity   . Diabetes mellitus   . Colonic adenoma   . Helicobacter pylori (H. pylori)     s/p treatment  . Anxiety   . Complication of anesthesia   . PONV (postoperative nausea and vomiting)   . Diverticulitis   . Chronic pain     Past Surgical History  Procedure Date  . Cholecystectomy   . Tubal ligation   . Appendectomy 2008    lower abd pain  . S/p hysterectomy 08/2007    No Cancer Cells  . Breast reduction surgery 08/2008    from a 44 DDD to a 44 C  . Bilateral salpingoophorectomy 06/2009  .  Tumor removal     from left shoulder  . Colonoscopy 01/04/10    tubular adenoma of cecum, normal TI, patchy fibrotic changes involving sigm, desc s/p bx neg. Next TCS 12/2016  . Esophagogastroduodenoscopy 01/04/10    normal esophagus s/p dilation, small hh, H pylori gastritis  . Esophagogastroduodenoscopy 2008    florid gastroesophageal reflux during exam but normal mucosa  . Abdominal hysterectomy   . Wisdom tooth extraction     Family History  Problem Relation Age of Onset  . Cancer Mother     cervical, deceased age 13  . Cancer Father     lung cancer, deceased age 35  . Anesthesia problems Neg Hx   . Hypotension Neg Hx   . Malignant hyperthermia Neg Hx   . Pseudochol deficiency Neg Hx     History  Substance Use Topics  . Smoking status: Never Smoker   . Smokeless tobacco: Never Used  . Alcohol Use: No    OB History    Grav Para Term Preterm Abortions TAB SAB Ect Mult Living                  Review of Systems  Constitutional: Negative for fever and chills.  Respiratory: Negative for cough and shortness of breath.   Cardiovascular: Negative for chest pain.  Gastrointestinal: Positive for abdominal pain. Negative for nausea, vomiting, diarrhea, constipation, blood in stool and anal bleeding.  Genitourinary: Negative for dysuria, frequency, hematuria, flank pain,  vaginal bleeding and vaginal discharge.    Allergies  Tomato; Demerol; Ketorolac tromethamine; Nalbuphine; Tramadol hcl; Zofran; and Aspirin  Home Medications   Current Outpatient Rx  Name Route Sig Dispense Refill  . ALPRAZOLAM 1 MG PO TABS Oral Take 1 mg by mouth 3 (three) times daily as needed. For anxiety    . CEPHALEXIN 500 MG PO CAPS Oral Take 1 capsule (500 mg total) by mouth 4 (four) times daily. 40 capsule 0  . VENLAFAXINE HCL 75 MG PO TABS Oral Take 75-150 mg by mouth 2 (two) times daily. **Take two tablets in the morning and take one tablet at bedtime**    . VERAPAMIL HCL ER (CO) 240 MG PO  TB24 Oral Take 240 mg by mouth every morning.     Marland Kitchen ZOLPIDEM TARTRATE 10 MG PO TABS Oral Take 10 mg by mouth at bedtime.      BP 127/83  Pulse 89  Temp 98.3 F (36.8 C)  Resp 16  SpO2 97%  Physical Exam  Nursing note and vitals reviewed. Constitutional: She is oriented to person, place, and time. She appears well-developed and well-nourished. No distress.  HENT:  Head: Normocephalic.  Cardiovascular: Normal rate and regular rhythm.   No murmur heard. Pulmonary/Chest: Effort normal and breath sounds normal. No respiratory distress. She has no wheezes. She has no rales.  Abdominal: Soft. There is tenderness in the right lower quadrant and suprapubic area. There is no rebound, no guarding and no CVA tenderness.       Obese. Exam somewhat limited due to body habitus.  Neurological: She is alert and oriented to person, place, and time.  Skin: Skin is warm and dry. No rash noted. No erythema.  Psychiatric: She has a normal mood and affect. Her behavior is normal.    ED Course  Procedures  Results for orders placed during the hospital encounter of 09/17/11  URINALYSIS, ROUTINE W REFLEX MICROSCOPIC      Component Value Range   Color, Urine YELLOW  YELLOW   APPearance CLEAR  CLEAR   Specific Gravity, Urine 1.021  1.005 - 1.030   pH 7.0  5.0 - 8.0   Glucose, UA NEGATIVE  NEGATIVE mg/dL   Hgb urine dipstick NEGATIVE  NEGATIVE   Bilirubin Urine NEGATIVE  NEGATIVE   Ketones, ur NEGATIVE  NEGATIVE mg/dL   Protein, ur NEGATIVE  NEGATIVE mg/dL   Urobilinogen, UA 0.2  0.0 - 1.0 mg/dL   Nitrite NEGATIVE  NEGATIVE   Leukocytes, UA NEGATIVE  NEGATIVE  POCT I-STAT, CHEM 8      Component Value Range   Sodium 142  135 - 145 mEq/L   Potassium 3.5  3.5 - 5.1 mEq/L   Chloride 102  96 - 112 mEq/L   BUN 10  6 - 23 mg/dL   Creatinine, Ser 1.61  0.50 - 1.10 mg/dL   Glucose, Bld 096 (*) 70 - 99 mg/dL   Calcium, Ion 0.45  4.09 - 1.23 mmol/L   TCO2 25  0 - 100 mmol/L   Hemoglobin 12.9  12.0 -  15.0 g/dL   HCT 81.1  91.4 - 78.2 %        1. Chronic abdominal pain       MDM  1:00AM patient seen and evaluated. Patient no acute distress and appears comfortable without signs of significant pain or discomfort.   UA shows signs of improvement. Labs otherwise unremarkable. Patient with chronic symptoms of significant changes. Patient has had unremarkable CT scans  a few weeks ago. At this time we'll discharge and have patient continue to followup with PCP.     Angus Seller, [place] 09/17/11 [phone]

## 2011-09-17 NOTE — Telephone Encounter (Signed)
Pt received her DC letter and CM spoke with her yesterday regarding this. Pt calls back again this morning asking to speak with CM. I told her that she was in a meeting. She asked if RMR was here and I said, Yes, he is seeing patients and is behind. Then she asked for RMR's nurse and I told her that she was in meeting with CM. She starts telling me that she needs to speak with RMR because she wants no one but him and will go to no one but him. She said she needs ASAP OV for her stomach problems. I told patient that I would have CM call her when she gets done with her meeting. Please call patient at [phone]

## 2011-09-17 NOTE — ED Notes (Signed)
Pt states treated Saturday am for same; continues to have abd pain.  States has been to hospital four times for same problem. Told Saturday she has UTI; given antibiotics; states having more pain tonight than before treatment

## 2011-09-17 NOTE — Telephone Encounter (Signed)
I spoke with this patient and told her that Dr. Jena Gauss stated the d/c letter still stands.  She asked me if I had the number to Dr. Teena Dunk in [place].  I found his office number and gave it to the patient.  She voiced understanding and thanked me for helping her.

## 2011-09-17 NOTE — Telephone Encounter (Signed)
i will speak with Dr. Jena Gauss and get back to the patient.

## 2011-09-17 NOTE — ED Notes (Signed)
Results received from [facility]. (+) URNC -> >/= 100,000 Klebsiella Pneumoniae.  Rx given in ED for Keflex -> sensitive to the same.  Chart appended per protocol.

## 2011-09-17 NOTE — ED Provider Notes (Signed)
Medical screening examination/treatment/procedure(s) were performed by non-physician practitioner and as supervising physician I was immediately available for consultation/collaboration.   Lyanne Co, MD 09/17/11 [phone]

## 2011-09-17 NOTE — Telephone Encounter (Signed)
I Spoke with Dr. Jena Gauss and he states that the patient was non compliant and the d/c letter still stands.  I called the patient, no answer, lmom.

## 2011-09-24 ENCOUNTER — Emergency Department (HOSPITAL_COMMUNITY)
Admission: EM | Admit: 2011-09-24 | Discharge: 2011-09-25 | Disposition: A | Discharge status: Home / Self Care | Payer: PRIVATE HEALTH INSURANCE | Attending: Emergency Medicine | Admitting: Emergency Medicine

## 2011-09-24 ENCOUNTER — Encounter (HOSPITAL_COMMUNITY): Payer: Self-pay | Admitting: Family Medicine

## 2011-09-24 DIAGNOSIS — E119 Type 2 diabetes mellitus without complications: Secondary | ICD-10-CM | POA: Insufficient documentation

## 2011-09-24 DIAGNOSIS — R109 Unspecified abdominal pain: Secondary | ICD-10-CM | POA: Insufficient documentation

## 2011-09-24 DIAGNOSIS — D649 Anemia, unspecified: Secondary | ICD-10-CM | POA: Insufficient documentation

## 2011-09-24 DIAGNOSIS — G8929 Other chronic pain: Secondary | ICD-10-CM | POA: Insufficient documentation

## 2011-09-24 DIAGNOSIS — E669 Obesity, unspecified: Secondary | ICD-10-CM | POA: Insufficient documentation

## 2011-09-24 DIAGNOSIS — F319 Bipolar disorder, unspecified: Secondary | ICD-10-CM | POA: Insufficient documentation

## 2011-09-24 LAB — COMPREHENSIVE METABOLIC PANEL
ALT: 30 U/L (ref 0–35)
AST: 21 U/L (ref 0–37)
Albumin: 3.3 g/dL — ABNORMAL LOW (ref 3.5–5.2)
Alkaline Phosphatase: 126 U/L — ABNORMAL HIGH (ref 39–117)
BUN: 9 mg/dL (ref 6–23)
CO2: 25 mEq/L (ref 19–32)
Calcium: 9.5 mg/dL (ref 8.4–10.5)
Chloride: 100 mEq/L (ref 96–112)
Creatinine, Ser: 0.57 mg/dL (ref 0.50–1.10)
GFR calc Af Amer: >90 mL/min (ref >90)
GFR calc non Af Amer: >90 mL/min (ref >90)
Glucose, Bld: 130 mg/dL — ABNORMAL HIGH (ref 70–99)
Potassium: 3.2 mEq/L — ABNORMAL LOW (ref 3.5–5.1)
Sodium: 136 mEq/L (ref 135–145)
Total Bilirubin: 0.1 mg/dL — ABNORMAL LOW (ref 0.3–1.2)
Total Protein: 7.3 g/dL (ref 6.0–8.3)

## 2011-09-24 LAB — URINALYSIS, ROUTINE W REFLEX MICROSCOPIC
Bilirubin Urine: NEGATIVE
Glucose, UA: NEGATIVE mg/dL
Hgb urine dipstick: NEGATIVE
Ketones, ur: NEGATIVE mg/dL
Leukocytes, UA: NEGATIVE
Nitrite: NEGATIVE
Protein, ur: NEGATIVE mg/dL
Specific Gravity, Urine: 1.029 (ref 1.005–1.030)
Urobilinogen, UA: 0.2 mg/dL (ref 0.0–1.0)
pH: 6 (ref 5.0–8.0)

## 2011-09-24 LAB — CBC WITH DIFFERENTIAL/PLATELET
Basophils Absolute: 0 10*3/uL (ref 0.0–0.1)
Basophils Relative: 0 % (ref 0–1)
Eosinophils Absolute: 0.3 10*3/uL (ref 0.0–0.7)
Eosinophils Relative: 2 % (ref 0–5)
HCT: 36.6 % (ref 36.0–46.0)
Hemoglobin: 11.9 g/dL — ABNORMAL LOW (ref 12.0–15.0)
Lymphocytes Relative: 23 % (ref 12–46)
Lymphs Abs: 3 10*3/uL (ref 0.7–4.0)
MCH: 26.4 pg (ref 26.0–34.0)
MCHC: 32.5 g/dL (ref 30.0–36.0)
MCV: 81.2 fL (ref 78.0–100.0)
Monocytes Absolute: 0.7 10*3/uL (ref 0.1–1.0)
Monocytes Relative: 5 % (ref 3–12)
Neutro Abs: 9.2 10*3/uL — ABNORMAL HIGH (ref 1.7–7.7)
Neutrophils Relative %: 69 % (ref 43–77)
Platelets: 390 10*3/uL (ref 150–400)
RBC: 4.51 MIL/uL (ref 3.87–5.11)
RDW: 14.3 % (ref 11.5–15.5)
WBC: 13.2 10*3/uL — ABNORMAL HIGH (ref 4.0–10.5)

## 2011-09-24 MED ORDER — HYDROMORPHONE HCL PF 1 MG/ML IJ SOLN
1.0000 mg | Freq: Once | INTRAMUSCULAR | Status: AC
  Administered 2011-09-24: 1 mg via INTRAVENOUS
  Filled 2011-09-24: qty 1

## 2011-09-24 MED ORDER — DIPHENHYDRAMINE HCL 50 MG/ML IJ SOLN
25.0000 mg | Freq: Once | INTRAMUSCULAR | Status: AC
  Administered 2011-09-24: via INTRAVENOUS
  Filled 2011-09-24: qty 1

## 2011-09-24 MED ORDER — ONDANSETRON HCL 4 MG/2ML IJ SOLN
4.0000 mg | Freq: Once | INTRAMUSCULAR | Status: AC
  Administered 2011-09-24: 4 mg via INTRAVENOUS
  Filled 2011-09-24: qty 2

## 2011-09-24 MED ORDER — POTASSIUM CHLORIDE CRYS ER 20 MEQ PO TBCR
40.0000 meq | EXTENDED_RELEASE_TABLET | Freq: Once | ORAL | Status: AC
  Administered 2011-09-24: 40 meq via ORAL
  Filled 2011-09-24: qty 2

## 2011-09-24 MED ORDER — DIPHENHYDRAMINE HCL 50 MG/ML IJ SOLN
25.0000 mg | Freq: Once | INTRAMUSCULAR | Status: AC
  Administered 2011-09-24: 21:00:00 via INTRAVENOUS
  Filled 2011-09-24: qty 1

## 2011-09-24 MED ORDER — SODIUM CHLORIDE 0.9 % IV SOLN
Freq: Once | INTRAVENOUS | Status: AC
  Administered 2011-09-24: 21:00:00 via INTRAVENOUS

## 2011-09-24 NOTE — ED Notes (Signed)
Pt reports lower abdominal pain x1 month with nausea. Reports having blood in stool and urine x2 days. States her doctors have been working on referrals but have not been able to follow up with anyone.

## 2011-09-24 NOTE — ED Provider Notes (Signed)
History     CSN: [id number]  Arrival date & time 09/24/11  1756   First MD Initiated Contact with Patient 09/24/11 2036      Chief Complaint  Patient presents with  . Abdominal Pain    (Consider location/radiation/quality/duration/timing/severity/associated sxs/prior treatment) HPI Comments: Patient presents today with a chief complaint of abdominal pain.  Pain has been present for the past 6 weeks.  Pain located across her lower abdomen.  Pain has been constant.  Pain gradually worsening.  She has been seen in the Emergency Department numerous times for this in the past.  She reports that her pain today is the same pain that she has had in the past.  She had a negative CT of her abdomen/pelvis on 09/01/11.  She has also seen her PCP for this numerous times and has been referred to Dr. Loreta Ave with GI.  She reports that she has also had blood in her stool and blood in her urine for the past few days.  She is feeling nauseous, but no vomiting.  She had a couple episodes of diarrhea 3 days ago, but no diarrhea since that time.  No fevers or chills.  Past surgical history significant for Cholecystectomy, Complete hysterectomy, and Appendectomy.    The history is provided by the patient.    Past Medical History  Diagnosis Date  . Breast infection 04/04/2010    hospitalized, given antibiotics, referred to surgeon  . Bipolar disorder   . Vaginitis 04/04/2010  . Anemia   . Migraine headache   . Hemorrhoids, internal   . Obesity   . Diabetes mellitus   . Colonic adenoma   . Helicobacter pylori (H. pylori)     s/p treatment  . Anxiety   . Complication of anesthesia   . PONV (postoperative nausea and vomiting)   . Diverticulitis   . Chronic pain     Past Surgical History  Procedure Date  . Cholecystectomy   . Tubal ligation   . Appendectomy 2008    lower abd pain  . S/p hysterectomy 08/2007    No Cancer Cells  . Breast reduction surgery 08/2008    from a 44 DDD to a 44 C  .  Bilateral salpingoophorectomy 06/2009  . Tumor removal     from left shoulder  . Colonoscopy 01/04/10    tubular adenoma of cecum, normal TI, patchy fibrotic changes involving sigm, desc s/p bx neg. Next TCS 12/2016  . Esophagogastroduodenoscopy 01/04/10    normal esophagus s/p dilation, small hh, H pylori gastritis  . Esophagogastroduodenoscopy 2008    florid gastroesophageal reflux during exam but normal mucosa  . Abdominal hysterectomy   . Wisdom tooth extraction     Family History  Problem Relation Age of Onset  . Cancer Mother     cervical, deceased age 87  . Cancer Father     lung cancer, deceased age 19  . Anesthesia problems Neg Hx   . Hypotension Neg Hx   . Malignant hyperthermia Neg Hx   . Pseudochol deficiency Neg Hx     History  Substance Use Topics  . Smoking status: Never Smoker   . Smokeless tobacco: Never Used  . Alcohol Use: No    OB History    Grav Para Term Preterm Abortions TAB SAB Ect Mult Living                  Review of Systems  Constitutional: Negative for fever, chills and appetite change.  Gastrointestinal:  Positive for nausea, abdominal pain and blood in stool. Negative for vomiting, diarrhea and abdominal distention.  Genitourinary: Positive for hematuria. Negative for dysuria, urgency, frequency, flank pain, vaginal bleeding, vaginal discharge and vaginal pain.  All other systems reviewed and are negative.    Allergies  Tomato; Demerol; Ketorolac tromethamine; Nalbuphine; Tramadol hcl; Zofran; and Aspirin  Home Medications   Current Outpatient Rx  Name Route Sig Dispense Refill  . ALPRAZOLAM 1 MG PO TABS Oral Take 1 mg by mouth 3 (three) times daily as needed. For anxiety    . VENLAFAXINE HCL 75 MG PO TABS Oral Take 75-150 mg by mouth See admin instructions. **Take two tablets in the morning and take one tablet at bedtime**    . VERAPAMIL HCL ER (CO) 240 MG PO TB24 Oral Take 240 mg by mouth every morning.     Marland Kitchen ZOLPIDEM TARTRATE 10  MG PO TABS Oral Take 10 mg by mouth at bedtime.      BP 117/73  Pulse 94  Temp 98.4 F (36.9 C) (Oral)  Resp 15  SpO2 98%  Physical Exam  Nursing note and vitals reviewed. Constitutional: She appears well-developed and well-nourished.  HENT:  Head: Normocephalic and atraumatic.  Mouth/Throat: Oropharynx is clear and moist.  Neck: Normal range of motion. Neck supple.  Cardiovascular: Normal rate, regular rhythm and normal heart sounds.   Pulmonary/Chest: Effort normal and breath sounds normal. No respiratory distress. She has no wheezes.  Abdominal: Soft. Bowel sounds are normal. She exhibits no mass. There is tenderness in the suprapubic area. There is no rigidity, no rebound, no guarding and no CVA tenderness.  Musculoskeletal: Normal range of motion.  Neurological: She is alert.  Skin: Skin is warm and dry.  Psychiatric: She has a normal mood and affect.    ED Course  Procedures (including critical care time)   Labs Reviewed  URINALYSIS, ROUTINE W REFLEX MICROSCOPIC  CBC WITH DIFFERENTIAL  COMPREHENSIVE METABOLIC PANEL   No results found.   No diagnosis found.  Patient reporting significant pain.  However, when I walked into the room she was laughing while talking on her cell phone and was eating a cookie.    MDM  Patient presenting with persistent abdominal pain that has been present for 6 weeks.  She states that the pain today is the same pain that she has had.  She had a negative CT of her abdomen and pelvis done on 09/01/11.  Her PCP is in the process of getting her follow up with Dr. Loreta Ave.  Patient afebrile.  No rebound or guarding on exam.  Therefore, do not feel that additional imaging is indicated today and feel that she can follow up outpatient.  Patient refused rectal and pelvic exam.          Magnus Sinning, PA-C 09/25/11 1456

## 2011-09-25 MED ORDER — OXYCODONE-ACETAMINOPHEN 5-325 MG PO TABS: 1.0000 | ORAL_TABLET | Freq: Four times a day (QID) | ORAL | Status: AC | PRN

## 2011-09-25 MED ORDER — ONDANSETRON 8 MG PO TBDP: 8.0000 mg | ORAL_TABLET | Freq: Three times a day (TID) | ORAL | Status: AC | PRN

## 2011-09-25 MED ORDER — HYDROMORPHONE HCL PF 1 MG/ML IJ SOLN
1.0000 mg | Freq: Once | INTRAMUSCULAR | Status: AC
  Administered 2011-09-25: 1 mg via INTRAVENOUS
  Filled 2011-09-25: qty 1

## 2011-09-27 NOTE — ED Provider Notes (Signed)
Medical screening examination/treatment/procedure(s) were performed by non-physician practitioner and as supervising physician I was immediately available for consultation/collaboration.  Flint Melter, MD 09/27/11 [phone]

## 2011-10-22 ENCOUNTER — Encounter: Payer: PRIVATE HEALTH INSURANCE | Admitting: Internal Medicine

## 2011-10-22 DIAGNOSIS — D72829 Elevated white blood cell count, unspecified: Secondary | ICD-10-CM

## 2011-10-22 DIAGNOSIS — D473 Essential (hemorrhagic) thrombocythemia: Secondary | ICD-10-CM

## 2011-11-26 ENCOUNTER — Encounter (HOSPITAL_COMMUNITY): Payer: Self-pay | Admitting: *Deleted

## 2011-11-26 ENCOUNTER — Emergency Department (HOSPITAL_COMMUNITY)
Admission: EM | Admit: 2011-11-26 | Discharge: 2011-11-26 | Disposition: A | Discharge status: Home / Self Care | Payer: PRIVATE HEALTH INSURANCE | Attending: Emergency Medicine | Admitting: Emergency Medicine

## 2011-11-26 DIAGNOSIS — M549 Dorsalgia, unspecified: Secondary | ICD-10-CM | POA: Insufficient documentation

## 2011-11-26 DIAGNOSIS — K625 Hemorrhage of anus and rectum: Secondary | ICD-10-CM | POA: Insufficient documentation

## 2011-11-26 HISTORY — DX: Deficiency of other specified B group vitamins: E53.8

## 2011-11-26 NOTE — ED Notes (Signed)
Pt advised NT that she was going to leave. Advised NT to let her know i will be in there in a minute. PER NT pt did not want to wait therefore AMA not signed. Pt not in room.

## 2011-11-26 NOTE — ED Notes (Signed)
Pt c/o lower back pain for the past three weeks, pt also noticed bright red blood yesterday when she had a bowel movement, pt states that she has had problems with having bowel movements before, having abd spasms, noticing orangish color in her stools when she has her bowel movement.

## 2011-12-04 ENCOUNTER — Emergency Department (HOSPITAL_COMMUNITY): Payer: PRIVATE HEALTH INSURANCE

## 2011-12-04 ENCOUNTER — Emergency Department (HOSPITAL_COMMUNITY)
Admission: EM | Admit: 2011-12-04 | Discharge: 2011-12-05 | Discharge status: Against Medical Advice | Payer: PRIVATE HEALTH INSURANCE | Attending: Emergency Medicine | Admitting: Emergency Medicine

## 2011-12-04 ENCOUNTER — Encounter (HOSPITAL_COMMUNITY): Payer: Self-pay | Admitting: *Deleted

## 2011-12-04 DIAGNOSIS — F319 Bipolar disorder, unspecified: Secondary | ICD-10-CM | POA: Insufficient documentation

## 2011-12-04 DIAGNOSIS — R4182 Altered mental status, unspecified: Secondary | ICD-10-CM | POA: Insufficient documentation

## 2011-12-04 DIAGNOSIS — Z79899 Other long term (current) drug therapy: Secondary | ICD-10-CM | POA: Insufficient documentation

## 2011-12-04 DIAGNOSIS — G8929 Other chronic pain: Secondary | ICD-10-CM | POA: Insufficient documentation

## 2011-12-04 DIAGNOSIS — R51 Headache: Secondary | ICD-10-CM | POA: Insufficient documentation

## 2011-12-04 DIAGNOSIS — Z9089 Acquired absence of other organs: Secondary | ICD-10-CM | POA: Insufficient documentation

## 2011-12-04 DIAGNOSIS — E119 Type 2 diabetes mellitus without complications: Secondary | ICD-10-CM | POA: Insufficient documentation

## 2011-12-04 DIAGNOSIS — R55 Syncope and collapse: Secondary | ICD-10-CM | POA: Insufficient documentation

## 2011-12-04 DIAGNOSIS — M542 Cervicalgia: Secondary | ICD-10-CM | POA: Insufficient documentation

## 2011-12-04 LAB — POCT PREGNANCY, URINE: Preg Test, Ur: NEGATIVE
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
## 2011-12-04 NOTE — ED Notes (Signed)
POCT pregnancy test negative.

## 2011-12-04 NOTE — ED Notes (Signed)
Gave patient ice water as requested and approved by MD. 

## 2011-12-04 NOTE — ED Notes (Signed)
Pt reports passing out this afternoon. Woke up in the floor. Increase in back pain, seems a little confused & left side of jaw numb.legs hurting

## 2011-12-04 NOTE — ED Notes (Signed)
Went into patient's room and introduced myself. Inquired with patient about reason for coming to ED. Patient stated "You really want to know that after I have been sitting here so long?" I advised patient that I would like to know what was wrong with her and she stated "Nothing. Nothing is wrong with me. I'm ready to go home." Asked patient if she did not want any further treatment and is that what she wants me to tell the doctor and patient stated "Yes. Tell her I am ready to go home." Advised MD.

## 2011-12-05 LAB — CBC WITH DIFFERENTIAL/PLATELET
Basophils Absolute: 0 10*3/uL (ref 0.0–0.1)
Basophils Relative: 0 % (ref 0–1)
Eosinophils Absolute: 0.3 10*3/uL (ref 0.0–0.7)
Eosinophils Relative: 3 % (ref 0–5)
HCT: 37.2 % (ref 36.0–46.0)
Hemoglobin: 12 g/dL (ref 12.0–15.0)
Lymphocytes Relative: 29 % (ref 12–46)
Lymphs Abs: 3 10*3/uL (ref 0.7–4.0)
MCH: 26.3 pg (ref 26.0–34.0)
MCHC: 32.3 g/dL (ref 30.0–36.0)
MCV: 81.6 fL (ref 78.0–100.0)
Monocytes Absolute: 0.4 10*3/uL (ref 0.1–1.0)
Monocytes Relative: 3 % (ref 3–12)
Neutro Abs: 6.7 10*3/uL (ref 1.7–7.7)
Neutrophils Relative %: 65 % (ref 43–77)
Platelets: 358 10*3/uL (ref 150–400)
RBC: 4.56 MIL/uL (ref 3.87–5.11)
RDW: 13.8 % (ref 11.5–15.5)
WBC: 10.4 10*3/uL (ref 4.0–10.5)

## 2011-12-05 LAB — URINALYSIS, ROUTINE W REFLEX MICROSCOPIC
Bilirubin Urine: NEGATIVE
Glucose, UA: NEGATIVE mg/dL
Hgb urine dipstick: NEGATIVE
Ketones, ur: NEGATIVE mg/dL
Nitrite: NEGATIVE
Protein, ur: NEGATIVE mg/dL
Specific Gravity, Urine: 1.025 (ref 1.005–1.030)
Urobilinogen, UA: 0.2 mg/dL (ref 0.0–1.0)
pH: 6.5 (ref 5.0–8.0)

## 2011-12-05 LAB — COMPREHENSIVE METABOLIC PANEL
ALT: 26 U/L (ref 0–35)
AST: 19 U/L (ref 0–37)
Albumin: 3.4 g/dL — ABNORMAL LOW (ref 3.5–5.2)
Alkaline Phosphatase: 105 U/L (ref 39–117)
BUN: 7 mg/dL (ref 6–23)
CO2: 27 mEq/L (ref 19–32)
Calcium: 9.4 mg/dL (ref 8.4–10.5)
Chloride: 101 mEq/L (ref 96–112)
Creatinine, Ser: 0.67 mg/dL (ref 0.50–1.10)
GFR calc Af Amer: >90 mL/min (ref >90)
GFR calc non Af Amer: >90 mL/min (ref >90)
Glucose, Bld: 108 mg/dL — ABNORMAL HIGH (ref 70–99)
Potassium: 3.4 mEq/L — ABNORMAL LOW (ref 3.5–5.1)
Sodium: 137 mEq/L (ref 135–145)
Total Bilirubin: 0.2 mg/dL — ABNORMAL LOW (ref 0.3–1.2)
Total Protein: 7.3 g/dL (ref 6.0–8.3)

## 2011-12-05 LAB — URINE MICROSCOPIC-ADD ON

## 2011-12-05 LAB — RAPID URINE DRUG SCREEN, HOSP PERFORMED
Amphetamines: NOT DETECTED
Barbiturates: NOT DETECTED
Benzodiazepines: POSITIVE — AB
Cocaine: NOT DETECTED
Opiates: POSITIVE — AB
Tetrahydrocannabinol: NOT DETECTED

## 2011-12-05 LAB — ETHANOL: Alcohol, Ethyl (B): <11 mg/dL (ref 0–11)

## 2011-12-05 MED ORDER — HYDROCODONE-ACETAMINOPHEN 5-325 MG PO TABS
2.0000 | ORAL_TABLET | Freq: Once | ORAL | Status: DC
  Filled 2011-12-05: qty 2

## 2011-12-05 MED ORDER — SULFAMETHOXAZOLE-TRIMETHOPRIM 800-160 MG PO TABS: 1.0000 | ORAL_TABLET | Freq: Two times a day (BID) | ORAL | Status: DC

## 2011-12-05 NOTE — ED Notes (Signed)
Patient came out of room and stated "Can the doctor just call me with the results. My back hurts from laying here and I just want to leave." Advised patient that I would speak with the doctor. Advised Dr Colon Branch and she stated she would come talk to patient. As I returned to nursing desk, patient was walking toward exit. I advised patient that the doctor would be in to speak with her in just a minute. Patient continued to walk toward exit and leave. Patient ambulated with no assistance. Gait was steady.

## 2011-12-05 NOTE — ED Notes (Signed)
Patient complaining of back pain. Requesting pain medication. Advised Dr Colon Branch.

## 2011-12-05 NOTE — ED Notes (Signed)
Went in to patient's room and advised her that the doctor had ordered her hydrocodone for pain. Patient stated "That's not going to help. That's what I take at home and it isn't going to help." Patient refused pain medication. Advised MD. No further orders at this time.

## 2011-12-11 ENCOUNTER — Emergency Department (HOSPITAL_COMMUNITY)
Admission: EM | Admit: 2011-12-11 | Discharge: 2011-12-11 | Discharge status: Against Medical Advice | Payer: PRIVATE HEALTH INSURANCE

## 2011-12-11 ENCOUNTER — Encounter (HOSPITAL_COMMUNITY): Payer: Self-pay | Admitting: Emergency Medicine

## 2011-12-11 NOTE — ED Notes (Signed)
Patient complaining of bilateral leg "numbness" x 5 days. States she saw her PCP on Monday and she stated if it happened again to come to ED. States she is waiting for MRI and EEG on legs. Also complaining of back pain.

## 2011-12-11 NOTE — ED Notes (Signed)
Patient asked me if we had a room available yet and I stated that we did not have one available yet and there was only one person left in front of her. Patient stated "I don't understand why you don't have a damn room for people like me. I cannot feel my legs and there may be something serious wrong with me." Advised patient that we would get her back as soon as we could and patient started yelling. Security called.
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
## 2012-01-09 ENCOUNTER — Encounter (HOSPITAL_COMMUNITY): Payer: Self-pay | Admitting: *Deleted

## 2012-01-09 ENCOUNTER — Emergency Department (HOSPITAL_COMMUNITY): Payer: PRIVATE HEALTH INSURANCE

## 2012-01-09 ENCOUNTER — Emergency Department (HOSPITAL_COMMUNITY)
Admission: EM | Admit: 2012-01-09 | Discharge: 2012-01-09 | Disposition: A | Discharge status: Home / Self Care | Payer: PRIVATE HEALTH INSURANCE | Attending: Emergency Medicine | Admitting: Emergency Medicine

## 2012-01-09 DIAGNOSIS — K5732 Diverticulitis of large intestine without perforation or abscess without bleeding: Secondary | ICD-10-CM | POA: Insufficient documentation

## 2012-01-09 DIAGNOSIS — R51 Headache: Secondary | ICD-10-CM

## 2012-01-09 DIAGNOSIS — Z8679 Personal history of other diseases of the circulatory system: Secondary | ICD-10-CM | POA: Insufficient documentation

## 2012-01-09 DIAGNOSIS — Z862 Personal history of diseases of the blood and blood-forming organs and certain disorders involving the immune mechanism: Secondary | ICD-10-CM | POA: Insufficient documentation

## 2012-01-09 DIAGNOSIS — Z87898 Personal history of other specified conditions: Secondary | ICD-10-CM | POA: Insufficient documentation

## 2012-01-09 DIAGNOSIS — Z8742 Personal history of other diseases of the female genital tract: Secondary | ICD-10-CM | POA: Insufficient documentation

## 2012-01-09 DIAGNOSIS — R209 Unspecified disturbances of skin sensation: Secondary | ICD-10-CM | POA: Insufficient documentation

## 2012-01-09 DIAGNOSIS — G8929 Other chronic pain: Secondary | ICD-10-CM | POA: Insufficient documentation

## 2012-01-09 DIAGNOSIS — Z79899 Other long term (current) drug therapy: Secondary | ICD-10-CM | POA: Insufficient documentation

## 2012-01-09 DIAGNOSIS — F319 Bipolar disorder, unspecified: Secondary | ICD-10-CM | POA: Insufficient documentation

## 2012-01-09 DIAGNOSIS — F411 Generalized anxiety disorder: Secondary | ICD-10-CM | POA: Insufficient documentation

## 2012-01-09 DIAGNOSIS — R110 Nausea: Secondary | ICD-10-CM | POA: Insufficient documentation

## 2012-01-09 DIAGNOSIS — Z8719 Personal history of other diseases of the digestive system: Secondary | ICD-10-CM | POA: Insufficient documentation

## 2012-01-09 DIAGNOSIS — Z87448 Personal history of other diseases of urinary system: Secondary | ICD-10-CM | POA: Insufficient documentation

## 2012-01-09 DIAGNOSIS — R0789 Other chest pain: Secondary | ICD-10-CM | POA: Insufficient documentation

## 2012-01-09 DIAGNOSIS — R0602 Shortness of breath: Secondary | ICD-10-CM | POA: Insufficient documentation

## 2012-01-09 DIAGNOSIS — E538 Deficiency of other specified B group vitamins: Secondary | ICD-10-CM | POA: Insufficient documentation

## 2012-01-09 DIAGNOSIS — Z8669 Personal history of other diseases of the nervous system and sense organs: Secondary | ICD-10-CM | POA: Insufficient documentation

## 2012-01-09 DIAGNOSIS — E119 Type 2 diabetes mellitus without complications: Secondary | ICD-10-CM | POA: Insufficient documentation

## 2012-01-09 LAB — URINALYSIS, ROUTINE W REFLEX MICROSCOPIC
Bilirubin Urine: NEGATIVE
Glucose, UA: NEGATIVE mg/dL
Hgb urine dipstick: NEGATIVE
Ketones, ur: NEGATIVE mg/dL
Leukocytes, UA: NEGATIVE
Nitrite: NEGATIVE
Protein, ur: NEGATIVE mg/dL
Specific Gravity, Urine: 1.029 (ref 1.005–1.030)
Urobilinogen, UA: 0.2 mg/dL (ref 0.0–1.0)
pH: 6 (ref 5.0–8.0)

## 2012-01-09 LAB — CBC WITH DIFFERENTIAL/PLATELET
Basophils Absolute: 0 10*3/uL (ref 0.0–0.1)
Basophils Relative: 0 % (ref 0–1)
Eosinophils Absolute: 0.2 10*3/uL (ref 0.0–0.7)
Eosinophils Relative: 1 % (ref 0–5)
HCT: 37.1 % (ref 36.0–46.0)
Hemoglobin: 12 g/dL (ref 12.0–15.0)
Lymphocytes Relative: 19 % (ref 12–46)
Lymphs Abs: 3.1 10*3/uL (ref 0.7–4.0)
MCH: 26.3 pg (ref 26.0–34.0)
MCHC: 32.3 g/dL (ref 30.0–36.0)
MCV: 81.2 fL (ref 78.0–100.0)
Monocytes Absolute: 0.7 10*3/uL (ref 0.1–1.0)
Monocytes Relative: 4 % (ref 3–12)
Neutro Abs: 12 10*3/uL — ABNORMAL HIGH (ref 1.7–7.7)
Neutrophils Relative %: 75 % (ref 43–77)
Platelets: 373 10*3/uL (ref 150–400)
RBC: 4.57 MIL/uL (ref 3.87–5.11)
RDW: 14.4 % (ref 11.5–15.5)
WBC: 16 10*3/uL — ABNORMAL HIGH (ref 4.0–10.5)

## 2012-01-09 LAB — COMPREHENSIVE METABOLIC PANEL
ALT: 21 U/L (ref 0–35)
AST: 12 U/L (ref 0–37)
Albumin: 3.1 g/dL — ABNORMAL LOW (ref 3.5–5.2)
Alkaline Phosphatase: 148 U/L — ABNORMAL HIGH (ref 39–117)
BUN: 13 mg/dL (ref 6–23)
CO2: 28 mEq/L (ref 19–32)
Calcium: 9.1 mg/dL (ref 8.4–10.5)
Chloride: 104 mEq/L (ref 96–112)
Creatinine, Ser: 0.74 mg/dL (ref 0.50–1.10)
GFR calc Af Amer: >90 mL/min (ref >90)
GFR calc non Af Amer: >90 mL/min (ref >90)
Glucose, Bld: 167 mg/dL — ABNORMAL HIGH (ref 70–99)
Potassium: 3.9 mEq/L (ref 3.5–5.1)
Sodium: 140 mEq/L (ref 135–145)
Total Bilirubin: 0.1 mg/dL — ABNORMAL LOW (ref 0.3–1.2)
Total Protein: 7.5 g/dL (ref 6.0–8.3)

## 2012-01-09 LAB — GLUCOSE, CAPILLARY: Glucose-Capillary: 170 mg/dL — ABNORMAL HIGH (ref 70–99)

## 2012-01-09 LAB — POCT I-STAT TROPONIN I: Troponin i, poc: 0 ng/mL (ref 0.00–0.08)

## 2012-01-09 MED ORDER — DIPHENHYDRAMINE HCL 50 MG/ML IJ SOLN
12.5000 mg | Freq: Once | INTRAMUSCULAR | Status: AC
  Administered 2012-01-09: 12.5 mg via INTRAVENOUS
  Filled 2012-01-09: qty 1

## 2012-01-09 MED ORDER — LORAZEPAM 2 MG/ML IJ SOLN
1.0000 mg | Freq: Once | INTRAMUSCULAR | Status: AC
  Administered 2012-01-09: 1 mg via INTRAVENOUS
  Filled 2012-01-09: qty 1

## 2012-01-09 MED ORDER — ACETAMINOPHEN 325 MG PO TABS: 650.0000 mg | ORAL_TABLET | Freq: Once | ORAL | Status: DC

## 2012-01-09 MED ORDER — SODIUM CHLORIDE 0.9 % IV BOLUS (SEPSIS)
1000.0000 mL | Freq: Once | INTRAVENOUS | Status: AC
  Administered 2012-01-09: 1000 mL via INTRAVENOUS

## 2012-01-09 MED ORDER — HYDROCODONE-ACETAMINOPHEN 5-325 MG PO TABS
1.0000 | ORAL_TABLET | Freq: Once | ORAL | Status: DC
  Filled 2012-01-09: qty 1

## 2012-01-09 MED ORDER — FENTANYL CITRATE 0.05 MG/ML IJ SOLN
25.0000 ug | Freq: Once | INTRAMUSCULAR | Status: AC
  Administered 2012-01-09: 25 ug via INTRAVENOUS
  Filled 2012-01-09: qty 2

## 2012-01-09 NOTE — ED Provider Notes (Signed)
History     CSN: [id number]  Arrival date & time 01/09/12  0154   First MD Initiated Contact with Patient 01/09/12 0253      Chief Complaint  Patient presents with  . Chest Pain    (Consider location/radiation/quality/duration/timing/severity/associated sxs/prior treatment) HPI Comments: Patient presents tonight complaining of 2, days.  The anxiety.  Intermittent periods of shortness of breath, numbness and tingling.  That radiates to her left arm decreased appetite, and headache.  She, states she had an allergic reaction to an epidural injection 2 weeks, ago, and has not felt well since, she was seen by her primary care Dr. 6 days ago  The history is provided by the patient.    Past Medical History  Diagnosis Date  . Breast infection 04/04/2010    hospitalized, given antibiotics, referred to surgeon  . Bipolar disorder   . Vaginitis 04/04/2010  . Anemia   . Migraine headache   . Hemorrhoids, internal   . Obesity   . Diabetes mellitus   . Colonic adenoma   . Helicobacter pylori (H. pylori)     s/p treatment  . Anxiety   . Complication of anesthesia   . PONV (postoperative nausea and vomiting)   . Diverticulitis   . Chronic pain   . Vitamin B12 deficiency     Past Surgical History  Procedure Date  . Cholecystectomy   . Tubal ligation   . Appendectomy 2008    lower abd pain  . S/p hysterectomy 08/2007    No Cancer Cells  . Breast reduction surgery 08/2008    from a 44 DDD to a 44 C  . Bilateral salpingoophorectomy 06/2009  . Tumor removal     from left shoulder  . Colonoscopy 01/04/10    tubular adenoma of cecum, normal TI, patchy fibrotic changes involving sigm, desc s/p bx neg. Next TCS 12/2016  . Esophagogastroduodenoscopy 01/04/10    normal esophagus s/p dilation, small hh, H pylori gastritis  . Esophagogastroduodenoscopy 2008    florid gastroesophageal reflux during exam but normal mucosa  . Abdominal hysterectomy   . Wisdom tooth extraction      Family History  Problem Relation Age of Onset  . Cancer Mother     cervical, deceased age 75  . Cancer Father     lung cancer, deceased age 33  . Anesthesia problems Neg Hx   . Hypotension Neg Hx   . Malignant hyperthermia Neg Hx   . Pseudochol deficiency Neg Hx     History  Substance Use Topics  . Smoking status: Never Smoker   . Smokeless tobacco: Never Used  . Alcohol Use: No    OB History    Grav Para Term Preterm Abortions TAB SAB Ect Mult Living                  Review of Systems  Constitutional: Positive for appetite change. Negative for fever and chills.  HENT: Negative for congestion and rhinorrhea.   Respiratory: Positive for shortness of breath. Negative for chest tightness.   Cardiovascular: Positive for chest pain. Negative for leg swelling.  Gastrointestinal: Positive for nausea. Negative for vomiting, diarrhea and constipation.  Genitourinary: Negative for dysuria and vaginal discharge.  Skin: Negative for rash and wound.  Neurological: Positive for headaches. Negative for dizziness and weakness.    Allergies  Demerol; Tomato; Cephalosporins; Ketorolac tromethamine; Lipitor; Nalbuphine; Pineapple; Prednisone; Tramadol hcl; Vicodin; Zofran; and Aspirin  Home Medications   Current Outpatient Rx  Name Route  Sig Dispense Refill  . ALPRAZOLAM 1 MG PO TABS Oral Take 1 mg by mouth 2 (two) times daily. For anxiety    . VITAMIN B-12 IJ Injection Inject as directed every 30 (thirty) days.    . FENTANYL 25 MCG/HR TD PT72 Transdermal Place 1 patch onto the skin every other day.    Marland Kitchen HYDROCODONE-ACETAMINOPHEN 10-325 MG PO TABS Oral Take 1 tablet by mouth every 6 (six) hours as needed. For pain    . LISINOPRIL 5 MG PO TABS Oral Take 5 mg by mouth daily.    Marland Kitchen PRAVASTATIN SODIUM 20 MG PO TABS Oral Take 20 mg by mouth daily.    . VENLAFAXINE HCL 75 MG PO TABS Oral Take 75-150 mg by mouth 2 (two) times daily. **Take one tablet in the morning and take two tablets at  bedtime**    . VERAPAMIL HCL ER (CO) 240 MG PO TB24 Oral Take 240 mg by mouth every morning.     Marland Kitchen ZOLPIDEM TARTRATE 10 MG PO TABS Oral Take 10 mg by mouth at bedtime.      BP 129/73  Pulse 73  Temp 98.2 F (36.8 C) (Oral)  Resp 24  SpO2 97%  Physical Exam  Constitutional: She is oriented to person, place, and time. She appears well-developed and well-nourished.       Morbidly obese  HENT:  Head: Normocephalic and atraumatic.  Mouth/Throat: Oropharynx is clear and moist.  Neck: Normal range of motion.  Cardiovascular: Normal rate.   Pulmonary/Chest: Effort normal and breath sounds normal. No respiratory distress. She has no wheezes.  Abdominal: Soft. Bowel sounds are normal. She exhibits no distension. There is no tenderness.  Musculoskeletal: She exhibits no edema and no tenderness.  Neurological: She is alert and oriented to person, place, and time.  Skin: Skin is warm. No rash noted. No erythema.    ED Course  Procedures (including critical care time)  Labs Reviewed  COMPREHENSIVE METABOLIC PANEL - Abnormal; Notable for the following:    Glucose, Bld 167 (*)     Albumin 3.1 (*)     Alkaline Phosphatase 148 (*)     Total Bilirubin 0.1 (*)     All other components within normal limits  CBC WITH DIFFERENTIAL - Abnormal; Notable for the following:    WBC 16.0 (*)     Neutro Abs 12.0 (*)     All other components within normal limits  GLUCOSE, CAPILLARY - Abnormal; Notable for the following:    Glucose-Capillary 170 (*)     All other components within normal limits  URINALYSIS, ROUTINE W REFLEX MICROSCOPIC  POCT I-STAT TROPONIN I   Dg Chest 2 View  01/09/2012  *RADIOLOGY REPORT*  Clinical Data: Left chest pain.  CHEST - 2 VIEW  Comparison: Plain films of the chest 08/16/2011.  CT chest 03/29/2010.  Findings: Lungs are clear.  Mild cardiomegaly.  No pneumothorax or pleural fluid.  IMPRESSION: Mild cardiomegaly without acute disease.   Original Report Authenticated By:  Bernadene Bell. D'ALESSIO, M.D.      1. Headache   2. Chest pain, non-cardiac     ED ECG REPORT   Date: 01/09/2012  EKG Time: 5:46 AM  Rate: 83  Rhythm: normal sinus rhythm,  unchanged from previous tracings  Axis: nomal  Intervals:none  ST&T Change: non specific T wave changes   Narrative Interpretation: abnormal            MDM  Patient reports, headache, and nausea.  Will medicate  her with Tylenol, and Ativan.  I will also ask that she be hydrated with 2 L of fluid  It was reported to me that the patient is refusing her Tylenol, and Ativan.  Requesting something stronger.  I've instructed the nurse to encourage the patient to start with Tylenol, and if this fails.  We can increase her pain control Patient again refused Tylenol at written for her to have a Vicodin, which is part of her home medications.  She informs the nurse, that she is allergic to Vicodin.  She does not have a fentanyl patch on at this time.  I will give her 25 mcg of fentanyl IV, accompanied by 12.5 milligrams of Benadryl IV and discharge allowing her to followup with her primary care provider today        Arman Filter, NP 01/09/12 [phone]

## 2012-01-09 NOTE — ED Notes (Signed)
Patient transported to X-ray 

## 2012-01-09 NOTE — ED Provider Notes (Signed)
Medical screening examination/treatment/procedure(s) were performed by non-physician practitioner and as supervising physician I was immediately available for consultation/collaboration.  Olivia Mackie, MD 01/09/12 [phone]

## 2012-01-09 NOTE — ED Notes (Signed)
Patient returned from X-ray 

## 2012-01-09 NOTE — ED Notes (Signed)
Pt discharged.Vital signs stable and GCS 15 

## 2012-01-09 NOTE — ED Notes (Addendum)
C/o CP, onset yesterday. Pain started in L arm, also reports nausea, dizziness, lightheadedness, L leg weakness, HA, blurred vision, sob. Significant hx noted. Alert, NAD, calm, interactive, skin W&D, resps e/u, speaking in clear complete sentences.

## 2012-01-26 ENCOUNTER — Encounter (HOSPITAL_COMMUNITY): Payer: Self-pay | Admitting: Emergency Medicine

## 2012-01-26 ENCOUNTER — Emergency Department (HOSPITAL_COMMUNITY): Payer: PRIVATE HEALTH INSURANCE

## 2012-01-26 ENCOUNTER — Other Ambulatory Visit: Payer: Self-pay

## 2012-01-26 ENCOUNTER — Emergency Department (HOSPITAL_COMMUNITY)
Admission: EM | Admit: 2012-01-26 | Discharge: 2012-01-26 | Disposition: A | Discharge status: Home / Self Care | Payer: PRIVATE HEALTH INSURANCE | Attending: Emergency Medicine | Admitting: Emergency Medicine

## 2012-01-26 DIAGNOSIS — R6883 Chills (without fever): Secondary | ICD-10-CM | POA: Insufficient documentation

## 2012-01-26 DIAGNOSIS — Z79899 Other long term (current) drug therapy: Secondary | ICD-10-CM | POA: Insufficient documentation

## 2012-01-26 DIAGNOSIS — R079 Chest pain, unspecified: Secondary | ICD-10-CM

## 2012-01-26 DIAGNOSIS — E119 Type 2 diabetes mellitus without complications: Secondary | ICD-10-CM | POA: Insufficient documentation

## 2012-01-26 DIAGNOSIS — Z9889 Other specified postprocedural states: Secondary | ICD-10-CM | POA: Insufficient documentation

## 2012-01-26 DIAGNOSIS — E669 Obesity, unspecified: Secondary | ICD-10-CM | POA: Insufficient documentation

## 2012-01-26 DIAGNOSIS — F319 Bipolar disorder, unspecified: Secondary | ICD-10-CM | POA: Insufficient documentation

## 2012-01-26 DIAGNOSIS — F411 Generalized anxiety disorder: Secondary | ICD-10-CM | POA: Insufficient documentation

## 2012-01-26 DIAGNOSIS — R209 Unspecified disturbances of skin sensation: Secondary | ICD-10-CM | POA: Insufficient documentation

## 2012-01-26 DIAGNOSIS — Z8719 Personal history of other diseases of the digestive system: Secondary | ICD-10-CM | POA: Insufficient documentation

## 2012-01-26 DIAGNOSIS — G43909 Migraine, unspecified, not intractable, without status migrainosus: Secondary | ICD-10-CM | POA: Insufficient documentation

## 2012-01-26 DIAGNOSIS — Z862 Personal history of diseases of the blood and blood-forming organs and certain disorders involving the immune mechanism: Secondary | ICD-10-CM | POA: Insufficient documentation

## 2012-01-26 LAB — COMPREHENSIVE METABOLIC PANEL
ALT: 22 U/L (ref 0–35)
AST: 11 U/L (ref 0–37)
Albumin: 3 g/dL — ABNORMAL LOW (ref 3.5–5.2)
Alkaline Phosphatase: 106 U/L (ref 39–117)
BUN: 13 mg/dL (ref 6–23)
CO2: 30 mEq/L (ref 19–32)
Calcium: 9.1 mg/dL (ref 8.4–10.5)
Chloride: 104 mEq/L (ref 96–112)
Creatinine, Ser: 0.72 mg/dL (ref 0.50–1.10)
GFR calc Af Amer: >90 mL/min (ref >90)
GFR calc non Af Amer: >90 mL/min (ref >90)
Glucose, Bld: 101 mg/dL — ABNORMAL HIGH (ref 70–99)
Potassium: 4.1 mEq/L (ref 3.5–5.1)
Sodium: 140 mEq/L (ref 135–145)
Total Bilirubin: 0.1 mg/dL — ABNORMAL LOW (ref 0.3–1.2)
Total Protein: 6.6 g/dL (ref 6.0–8.3)

## 2012-01-26 LAB — CBC
HCT: 37.2 % (ref 36.0–46.0)
Hemoglobin: 11.8 g/dL — ABNORMAL LOW (ref 12.0–15.0)
MCH: 26.5 pg (ref 26.0–34.0)
MCHC: 31.7 g/dL (ref 30.0–36.0)
MCV: 83.4 fL (ref 78.0–100.0)
Platelets: 356 10*3/uL (ref 150–400)
RBC: 4.46 MIL/uL (ref 3.87–5.11)
RDW: 14.2 % (ref 11.5–15.5)
WBC: 12.1 10*3/uL — ABNORMAL HIGH (ref 4.0–10.5)

## 2012-01-26 LAB — TROPONIN I: Troponin I: <0.3 ng/mL (ref <0.30)

## 2012-01-26 LAB — GLUCOSE, CAPILLARY: Glucose-Capillary: 130 mg/dL — ABNORMAL HIGH (ref 70–99)

## 2012-01-26 LAB — PRO B NATRIURETIC PEPTIDE: Pro B Natriuretic peptide (BNP): 74.9 pg/mL (ref 0–125)

## 2012-01-26 NOTE — ED Provider Notes (Signed)
History   This chart was scribed for Brenda Jakes, MD by Charolett Bumpers, ER Scribe. The patient was seen in room APA05/APA05. Patient's care was started at 1500.   CSN: [id number]  Arrival date & time 01/26/12  1218   First MD Initiated Contact with Patient 01/26/12 1500      Chief Complaint  Patient presents with  . Chest Pain    HPI Comments: Brenda Richardson is a 31 y.o. female who presents to the Emergency Department complaining of constant, moderate chest pain for the past week. She states her symptoms started with an episode of sudden onset of sharp chest pain, her left arm numbness, chills, diaphoresis that lasted for 15 minutes a week ago. She states that chest pain resolved but has had intermittent, less severe, left substernal chest pain since lasting 3 minutes at a time. She reports it radiates to left side of neck and rates an 8-9/10. She states that her BP has been fluctuating. BP here in ED is 99/52. She is on fentanyl patches currently for chronic back pain. She was seen at [facility] for HTN and hyperglycemia. She has a h/o anxiety, DM, diet control. She denies any h/o MI or other cardiac problems.   PCP: Dr. Paulino Rily with [facility].   Patient is a 31 y.o. female presenting with chest pain. The history is provided by the patient. No language interpreter was used.  Chest Pain The chest pain began 5 - 7 days ago. Chest pain occurs intermittently. The chest pain is improving. The severity of the pain is moderate. The quality of the pain is described as sharp. Primary symptoms include nausea. Pertinent negatives for primary symptoms include no shortness of breath.  Associated symptoms include diaphoresis and numbness.     Past Medical History  Diagnosis Date  . Breast infection 04/04/2010    hospitalized, given antibiotics, referred to surgeon  . Bipolar disorder   . Vaginitis 04/04/2010  . Anemia   . Migraine headache   . Hemorrhoids, internal   .  Obesity   . Diabetes mellitus   . Colonic adenoma   . Helicobacter pylori (H. pylori)     s/p treatment  . Anxiety   . Complication of anesthesia   . PONV (postoperative nausea and vomiting)   . Diverticulitis   . Chronic pain   . Vitamin B12 deficiency     Past Surgical History  Procedure Date  . Cholecystectomy   . Tubal ligation   . Appendectomy 2008    lower abd pain  . S/p hysterectomy 08/2007    No Cancer Cells  . Breast reduction surgery 08/2008    from a 44 DDD to a 44 C  . Bilateral salpingoophorectomy 06/2009  . Tumor removal     from left shoulder  . Colonoscopy 01/04/10    tubular adenoma of cecum, normal TI, patchy fibrotic changes involving sigm, desc s/p bx neg. Next TCS 12/2016  . Esophagogastroduodenoscopy 01/04/10    normal esophagus s/p dilation, small hh, H pylori gastritis  . Esophagogastroduodenoscopy 2008    florid gastroesophageal reflux during exam but normal mucosa  . Abdominal hysterectomy   . Wisdom tooth extraction     Family History  Problem Relation Age of Onset  . Cancer Mother     cervical, deceased age 74  . Cancer Father     lung cancer, deceased age 51  . Anesthesia problems Neg Hx   . Hypotension Neg Hx   .  Malignant hyperthermia Neg Hx   . Pseudochol deficiency Neg Hx     History  Substance Use Topics  . Smoking status: Never Smoker   . Smokeless tobacco: Never Used  . Alcohol Use: No    OB History    Grav Para Term Preterm Abortions TAB SAB Ect Mult Living                  Review of Systems  Constitutional: Positive for chills and diaphoresis.  Respiratory: Negative for shortness of breath.   Cardiovascular: Positive for chest pain.  Gastrointestinal: Positive for nausea.  Genitourinary: Negative for dysuria.  Musculoskeletal: Positive for back pain.  Skin: Negative for rash.  Neurological: Positive for numbness. Negative for headaches.  All other systems reviewed and are negative.    Allergies  Demerol;  Prednisone; Tomato; Cephalosporins; Ketorolac tromethamine; Lipitor; Nalbuphine; Pineapple; Tramadol hcl; Vicodin; Zofran; and Aspirin  Home Medications   Current Outpatient Rx  Name  Route  Sig  Dispense  Refill  . ALPRAZOLAM 1 MG PO TABS   Oral   Take 1 mg by mouth 2 (two) times daily. For anxiety         . VITAMIN B-12 IJ   Injection   Inject as directed every 30 (thirty) days.         . FENTANYL 25 MCG/HR TD PT72   Transdermal   Place 1 patch onto the skin every other day.         Marland Kitchen LISINOPRIL 5 MG PO TABS   Oral   Take 5 mg by mouth daily.         Marland Kitchen PRAVASTATIN SODIUM 20 MG PO TABS   Oral   Take 20 mg by mouth daily.         . VENLAFAXINE HCL 75 MG PO TABS   Oral   Take 75-150 mg by mouth 2 (two) times daily. **Take one tablet in the morning and take two tablets at bedtime**         . VERAPAMIL HCL ER (CO) 240 MG PO TB24   Oral   Take 240 mg by mouth every morning.          Marland Kitchen ZOLPIDEM TARTRATE 10 MG PO TABS   Oral   Take 10 mg by mouth at bedtime.           BP 124/80  Pulse 72  Temp 98 F (36.7 C) (Oral)  Resp 16  Ht 5\' 6"  (1.676 m)  Wt 249 lb (112.946 kg)  BMI 40.19 kg/m2  SpO2 94%  Physical Exam  Nursing note and vitals reviewed. Constitutional: She is oriented to person, place, and time. She appears well-developed and well-nourished. No distress.  HENT:  Head: Normocephalic and atraumatic.  Mouth/Throat: Oropharynx is clear and moist.       Mucous membranes moist.   Eyes: Conjunctivae normal and EOM are normal. Pupils are equal, round, and reactive to light. No scleral icterus.  Neck: Normal range of motion. Neck supple. No tracheal deviation present.  Cardiovascular: Normal rate, regular rhythm and normal heart sounds.   No murmur heard. Pulmonary/Chest: Effort normal and breath sounds normal. No respiratory distress. She has no wheezes.  Abdominal: Soft. Bowel sounds are normal. She exhibits no distension. There is no tenderness.   Musculoskeletal: Normal range of motion. She exhibits no edema.       No swelling in ankles bilaterally. Moves all extremities x4.   Lymphadenopathy:    She has no  cervical adenopathy.  Neurological: She is alert and oriented to person, place, and time. No cranial nerve deficit.       Cranial nerves intact.   Skin: Skin is warm and dry.       Cap refill 1 sec in great toes bilaterally.   Psychiatric: She has a normal mood and affect. Her behavior is normal.    ED Course  Procedures (including critical care time)  DIAGNOSTIC STUDIES: Oxygen Saturation is 96% on room air, adequate by my interpretation.    COORDINATION OF CARE:  15:30-Discussed planned course of treatment with the patient, who is agreeable at this time.   Results for orders placed during the hospital encounter of 01/26/12  GLUCOSE, CAPILLARY      Component Value Range   Glucose-Capillary 130 (*) 70 - 99 mg/dL  PRO B NATRIURETIC PEPTIDE      Component Value Range   Pro B Natriuretic peptide (BNP) 74.9  0 - 125 pg/mL  CBC      Component Value Range   WBC 12.1 (*) 4.0 - 10.5 K/uL   RBC 4.46  3.87 - 5.11 MIL/uL   Hemoglobin 11.8 (*) 12.0 - 15.0 g/dL   HCT 45.4  09.8 - 11.9 %   MCV 83.4  78.0 - 100.0 fL   MCH 26.5  26.0 - 34.0 pg   MCHC 31.7  30.0 - 36.0 g/dL   RDW 14.7  82.9 - 56.2 %   Platelets 356  150 - 400 K/uL  TROPONIN I      Component Value Range   Troponin I <0.30  <0.30 ng/mL  COMPREHENSIVE METABOLIC PANEL      Component Value Range   Sodium 140  135 - 145 mEq/L   Potassium 4.1  3.5 - 5.1 mEq/L   Chloride 104  96 - 112 mEq/L   CO2 30  19 - 32 mEq/L   Glucose, Bld 101 (*) 70 - 99 mg/dL   BUN 13  6 - 23 mg/dL   Creatinine, Ser 1.30  0.50 - 1.10 mg/dL   Calcium 9.1  8.4 - 86.5 mg/dL   Total Protein 6.6  6.0 - 8.3 g/dL   Albumin 3.0 (*) 3.5 - 5.2 g/dL   AST 11  0 - 37 U/L   ALT 22  0 - 35 U/L   Alkaline Phosphatase 106  39 - 117 U/L   Total Bilirubin 0.1 (*) 0.3 - 1.2 mg/dL   GFR calc non Af  Amer >90  >90 mL/min   GFR calc Af Amer >90  >90 mL/min    Dg Chest 2 View  01/26/2012  *RADIOLOGY REPORT*  Clinical Data: Chest pain, weakness  CHEST - 2 VIEW  Comparison: 01/09/2012  Findings: Lungs are essentially clear.  No pleural effusion or pneumothorax.  Cardiomediastinal silhouette is within normal limits.  Visualized osseous structures are within normal limits.  IMPRESSION: No evidence of acute cardiopulmonary disease.   Original Report Authenticated By: Charline Bills, M.D.     Date: 01/26/2012  Rate: 77  Rhythm: normal sinus rhythm  QRS Axis: normal  Intervals: normal  ST/T Wave abnormalities: normal  Conduction Disutrbances:none  Narrative Interpretation:   Old EKG Reviewed: unchanged 08/16/11   1. Chest pain       MDM  Chest pain workup was negative. Patient's had intermittent chest pain for one week no evidence of acute cardiac event pneumothorax or pneumonia. Chest pain is not consistent with a pulmonary embolism at this point in time.  Patient followup with her primary care doctor later this week. Patient has had similar chest discomfort on and off for many weeks prior to this as well.    I personally performed the services described in this documentation, which was scribed in my presence. The recorded information has been reviewed and is accurate.       Brenda Jakes, MD 01/26/12 [phone]

## 2012-01-26 NOTE — ED Notes (Signed)
Pt c/o chest pain x one week, has been having cold sweats, dizzy, and confused. Pt has had her pain patches changed this week.

## 2012-01-26 NOTE — ED Notes (Signed)
Pt c/o lethargy and chest pain x1 week. Pt states she started on fentanyl patch for back pain recently and has been feeling "constantly" lethargic since. Pt also reports no change in back pain with patch. Pt states she had "severe chest pain" on Monday and states pain has been intermittent since. Pt also c/o SOB and dizziness but states "that's how I always am".

## 2012-05-18 ENCOUNTER — Emergency Department (HOSPITAL_COMMUNITY)
Admission: EM | Admit: 2012-05-18 | Discharge: 2012-05-18 | Disposition: A | Discharge status: Home / Self Care | Payer: PRIVATE HEALTH INSURANCE | Attending: Emergency Medicine | Admitting: Emergency Medicine

## 2012-05-18 ENCOUNTER — Encounter (HOSPITAL_COMMUNITY): Payer: Self-pay | Admitting: *Deleted

## 2012-05-18 ENCOUNTER — Emergency Department (HOSPITAL_COMMUNITY): Payer: PRIVATE HEALTH INSURANCE

## 2012-05-18 DIAGNOSIS — W1809XA Striking against other object with subsequent fall, initial encounter: Secondary | ICD-10-CM | POA: Insufficient documentation

## 2012-05-18 DIAGNOSIS — W208XXA Other cause of strike by thrown, projected or falling object, initial encounter: Secondary | ICD-10-CM | POA: Insufficient documentation

## 2012-05-18 DIAGNOSIS — Z79899 Other long term (current) drug therapy: Secondary | ICD-10-CM | POA: Insufficient documentation

## 2012-05-18 DIAGNOSIS — Z8719 Personal history of other diseases of the digestive system: Secondary | ICD-10-CM | POA: Insufficient documentation

## 2012-05-18 DIAGNOSIS — F319 Bipolar disorder, unspecified: Secondary | ICD-10-CM | POA: Insufficient documentation

## 2012-05-18 DIAGNOSIS — Z860100 Personal history of colon polyps, unspecified: Secondary | ICD-10-CM | POA: Insufficient documentation

## 2012-05-18 DIAGNOSIS — Z8619 Personal history of other infectious and parasitic diseases: Secondary | ICD-10-CM | POA: Insufficient documentation

## 2012-05-18 DIAGNOSIS — D649 Anemia, unspecified: Secondary | ICD-10-CM | POA: Insufficient documentation

## 2012-05-18 DIAGNOSIS — S139XXA Sprain of joints and ligaments of unspecified parts of neck, initial encounter: Secondary | ICD-10-CM | POA: Insufficient documentation

## 2012-05-18 DIAGNOSIS — E119 Type 2 diabetes mellitus without complications: Secondary | ICD-10-CM | POA: Insufficient documentation

## 2012-05-18 DIAGNOSIS — Z8679 Personal history of other diseases of the circulatory system: Secondary | ICD-10-CM | POA: Insufficient documentation

## 2012-05-18 DIAGNOSIS — IMO0002 Reserved for concepts with insufficient information to code with codable children: Secondary | ICD-10-CM | POA: Insufficient documentation

## 2012-05-18 DIAGNOSIS — Y9389 Activity, other specified: Secondary | ICD-10-CM | POA: Insufficient documentation

## 2012-05-18 DIAGNOSIS — Z8742 Personal history of other diseases of the female genital tract: Secondary | ICD-10-CM | POA: Insufficient documentation

## 2012-05-18 DIAGNOSIS — E538 Deficiency of other specified B group vitamins: Secondary | ICD-10-CM | POA: Insufficient documentation

## 2012-05-18 DIAGNOSIS — F411 Generalized anxiety disorder: Secondary | ICD-10-CM | POA: Insufficient documentation

## 2012-05-18 DIAGNOSIS — Y9289 Other specified places as the place of occurrence of the external cause: Secondary | ICD-10-CM | POA: Insufficient documentation

## 2012-05-18 DIAGNOSIS — S161XXA Strain of muscle, fascia and tendon at neck level, initial encounter: Secondary | ICD-10-CM

## 2012-05-18 DIAGNOSIS — E669 Obesity, unspecified: Secondary | ICD-10-CM | POA: Insufficient documentation

## 2012-05-18 MED ORDER — OXYCODONE-ACETAMINOPHEN 5-325 MG PO TABS
1.0000 | ORAL_TABLET | Freq: Once | ORAL | Status: AC
  Administered 2012-05-18: 1 via ORAL
  Filled 2012-05-18: qty 1

## 2012-05-18 MED ORDER — METHOCARBAMOL 500 MG PO TABS: ORAL_TABLET | ORAL | Status: DC

## 2012-05-18 MED ORDER — HYDROMORPHONE HCL PF 1 MG/ML IJ SOLN
1.0000 mg | Freq: Once | INTRAMUSCULAR | Status: AC
  Administered 2012-05-18: 1 mg via INTRAMUSCULAR
  Filled 2012-05-18: qty 1

## 2012-05-18 NOTE — ED Notes (Addendum)
Back pain, says on Saturday, dresser fell against her back.  Pain from neck down, legs hurt also, says she has trouble walking.  Alert,   Went to [place] and it was "too crowded"

## 2012-05-18 NOTE — ED Notes (Signed)
Pain reassessment should have been timed for 1530.

## 2012-05-20 NOTE — ED Provider Notes (Signed)
History     CSN: [id number]  Arrival date & time 05/18/12  1211   First MD Initiated Contact with Patient 05/18/12 1311      Chief Complaint  Patient presents with  . Back Pain    (Consider location/radiation/quality/duration/timing/severity/associated sxs/prior treatment) HPI Comments: Patient c/o pain to her neck after a piece of furniture fell on her back.  states it caused her to fall , but states she fell on to a bed.  Pain  To her neck is worse with movement and improves with rest.  States she has chronic low back pain and has been taking her prescribed soma without relief.  She denies numbness, weakness, LOC, headaches or dizziness.    Patient is a 32 y.o. female presenting with neck injury. The history is provided by the patient.  Neck Injury This is a new problem. The current episode started in the past 7 days. The problem occurs constantly. The problem has been unchanged. Associated symptoms include arthralgias and neck pain. Pertinent negatives include no chest pain, chills, fever, headaches, joint swelling, numbness, rash, vomiting or weakness. The symptoms are aggravated by bending and twisting. She has tried oral narcotics and position changes for the symptoms. The treatment provided no relief.    Past Medical History  Diagnosis Date  . Breast infection 04/04/2010    hospitalized, given antibiotics, referred to surgeon  . Bipolar disorder   . Vaginitis 04/04/2010  . Anemia   . Migraine headache   . Hemorrhoids, internal   . Obesity   . Diabetes mellitus   . Colonic adenoma   . Helicobacter pylori (H. pylori)     s/p treatment  . Anxiety   . Complication of anesthesia   . PONV (postoperative nausea and vomiting)   . Diverticulitis   . Chronic pain   . Vitamin B12 deficiency     Past Surgical History  Procedure Laterality Date  . Cholecystectomy    . Tubal ligation    . Appendectomy  2008    lower abd pain  . S/p hysterectomy  08/2007    No Cancer Cells   . Breast reduction surgery  08/2008    from a 44 DDD to a 44 C  . Bilateral salpingoophorectomy  06/2009  . Tumor removal      from left shoulder  . Colonoscopy  01/04/10    tubular adenoma of cecum, normal TI, patchy fibrotic changes involving sigm, desc s/p bx neg. Next TCS 12/2016  . Esophagogastroduodenoscopy  01/04/10    normal esophagus s/p dilation, small hh, H pylori gastritis  . Esophagogastroduodenoscopy  2008    florid gastroesophageal reflux during exam but normal mucosa  . Abdominal hysterectomy    . Wisdom tooth extraction      Family History  Problem Relation Age of Onset  . Cancer Mother     cervical, deceased age 43  . Cancer Father     lung cancer, deceased age 13  . Anesthesia problems Neg Hx   . Hypotension Neg Hx   . Malignant hyperthermia Neg Hx   . Pseudochol deficiency Neg Hx     History  Substance Use Topics  . Smoking status: Never Smoker   . Smokeless tobacco: Never Used  . Alcohol Use: No    OB History   Grav Para Term Preterm Abortions TAB SAB Ect Mult Living                  Review of Systems  Constitutional: Negative  for fever and chills.  HENT: Positive for neck pain. Negative for facial swelling and neck stiffness.   Eyes: Negative for visual disturbance.  Cardiovascular: Negative for chest pain.  Gastrointestinal: Negative for vomiting.  Genitourinary: Negative for dysuria and difficulty urinating.  Musculoskeletal: Positive for back pain and arthralgias. Negative for joint swelling and gait problem.  Skin: Negative for color change, rash and wound.  Neurological: Negative for dizziness, syncope, facial asymmetry, weakness, numbness and headaches.  Psychiatric/Behavioral: Negative for confusion.  All other systems reviewed and are negative.    Allergies  Demerol; Prednisone; Tomato; Cephalosporins; Ketorolac tromethamine; Lipitor; Nalbuphine; Pineapple; Tramadol hcl; Vicodin; Zofran; and Aspirin  Home Medications   Current  Outpatient Rx  Name  Route  Sig  Dispense  Refill  . ALPRAZolam (XANAX) 1 MG tablet   Oral   Take 1 mg by mouth 2 (two) times daily. For anxiety         . carisoprodol (SOMA) 350 MG tablet   Oral   Take 350 mg by mouth 4 (four) times daily as needed for muscle spasms.         . Cyanocobalamin (VITAMIN B-12 IJ)   Injection   Inject as directed every 30 (thirty) days.         Marland Kitchen lisinopril (PRINIVIL,ZESTRIL) 5 MG tablet   Oral   Take 5 mg by mouth daily.         . pravastatin (PRAVACHOL) 20 MG tablet   Oral   Take 20 mg by mouth daily.         Marland Kitchen venlafaxine (EFFEXOR) 75 MG tablet   Oral   Take 75-150 mg by mouth 2 (two) times daily. **Take one tablet in the morning and take two tablets at bedtime**         . verapamil (COVERA HS) 240 MG (CO) 24 hr tablet   Oral   Take 240 mg by mouth every morning.          . zolpidem (AMBIEN) 10 MG tablet   Oral   Take 10 mg by mouth at bedtime as needed for sleep.          . methocarbamol (ROBAXIN) 500 MG tablet      Two tabs po TID prn muscle spasms   42 tablet   0     BP 107/68  Pulse 86  Temp(Src) 97.9 F (36.6 C) (Oral)  Resp 18  Ht 5\' 6"  (1.676 m)  Wt 246 lb (111.585 kg)  BMI 39.72 kg/m2  SpO2 100%  Physical Exam  Nursing note and vitals reviewed. Constitutional: She is oriented to person, place, and time. She appears well-developed and well-nourished. No distress.  HENT:  Head: Normocephalic and atraumatic.  Mouth/Throat: Oropharynx is clear and moist.  Eyes: EOM are normal. Pupils are equal, round, and reactive to light.  Neck: Phonation normal. Muscular tenderness present. No spinous process tenderness present. No rigidity. Decreased range of motion present. No erythema present. No Brudzinski's sign and no Kernig's sign noted. No thyromegaly present.  ttp of the cervical spine and bilateral paraspinal muscles.  Grip strength is strong and equal bilaterally.  Distal Sensation intact,  CR < 2 sec.  Radial pulses brisk and symmetrical  Cardiovascular: Normal rate, regular rhythm, normal heart sounds and intact distal pulses.   No murmur heard. Pulmonary/Chest: Effort normal and breath sounds normal. No respiratory distress. She exhibits no tenderness.  Musculoskeletal: She exhibits tenderness. She exhibits no edema.  Cervical back: She exhibits tenderness. She exhibits normal range of motion, no bony tenderness, no swelling, no deformity, no spasm and normal pulse.  See neck exam  Lymphadenopathy:    She has no cervical adenopathy.  Neurological: She is alert and oriented to person, place, and time. No cranial nerve deficit or sensory deficit. She exhibits normal muscle tone. Coordination normal.  Reflex Scores:      Tricep reflexes are 2+ on the right side and 2+ on the left side.      Bicep reflexes are 2+ on the right side and 2+ on the left side. Skin: Skin is warm and dry.    ED Course  Procedures (including critical care time)  Labs Reviewed - No data to display No results found.   Dg Cervical Spine Complete  05/18/2012  *RADIOLOGY REPORT*  Clinical Data: Object fell on her neck Saturday, neck pain  CERVICAL SPINE - COMPLETE 4+ VIEW  Comparison: Cervical spine films of 12/29/2009  Findings: The cervical vertebrae are slightly straightened in alignment.  Intervertebral disc spaces appear normal.  No prevertebral soft tissue swelling is seen.  On oblique views the foramina are patent.  The odontoid process intact.  The lung apices are clear.  IMPRESSION: Straightened alignment.  No acute abnormality.   Original Report Authenticated By: Dwyane Dee, M.D.     1. Cervical strain, initial encounter       MDM   Pt feels better after medications, no focal neuro deficits , moves all extremities w/o difficulty.  Doubt emergent neuro or infectious process.     Pt agrees to rest, ice, and close f/u with her PMD.    The patient appears reasonably screened and/or stabilized for  discharge and I doubt any other medical condition or other [facility] requiring further screening, evaluation, or treatment in the ED at this time prior to discharge.     Llewellyn Schoenberger L. Trisha Mangle, PA-C 05/20/12 1714

## 2012-05-21 NOTE — ED Provider Notes (Signed)
Medical screening examination/treatment/procedure(s) were performed by non-physician practitioner and as supervising physician I was immediately available for consultation/collaboration. Devoria Albe, MD, [facility]   Ward Givens, MD 05/21/12 [phone]

## 2012-05-29 ENCOUNTER — Ambulatory Visit: Payer: Self-pay | Admitting: Family Medicine

## 2012-06-23 ENCOUNTER — Encounter (HOSPITAL_COMMUNITY): Payer: Self-pay | Admitting: *Deleted

## 2012-06-23 ENCOUNTER — Emergency Department (HOSPITAL_COMMUNITY)
Admission: EM | Admit: 2012-06-23 | Discharge: 2012-06-24 | Disposition: A | Discharge status: Home / Self Care | Payer: PRIVATE HEALTH INSURANCE | Attending: Emergency Medicine | Admitting: Emergency Medicine

## 2012-06-23 DIAGNOSIS — E119 Type 2 diabetes mellitus without complications: Secondary | ICD-10-CM | POA: Insufficient documentation

## 2012-06-23 DIAGNOSIS — IMO0001 Reserved for inherently not codable concepts without codable children: Secondary | ICD-10-CM | POA: Insufficient documentation

## 2012-06-23 DIAGNOSIS — F411 Generalized anxiety disorder: Secondary | ICD-10-CM | POA: Insufficient documentation

## 2012-06-23 DIAGNOSIS — R109 Unspecified abdominal pain: Secondary | ICD-10-CM | POA: Insufficient documentation

## 2012-06-23 DIAGNOSIS — Z8742 Personal history of other diseases of the female genital tract: Secondary | ICD-10-CM | POA: Insufficient documentation

## 2012-06-23 DIAGNOSIS — Z8619 Personal history of other infectious and parasitic diseases: Secondary | ICD-10-CM | POA: Insufficient documentation

## 2012-06-23 DIAGNOSIS — R197 Diarrhea, unspecified: Secondary | ICD-10-CM | POA: Insufficient documentation

## 2012-06-23 DIAGNOSIS — Z8719 Personal history of other diseases of the digestive system: Secondary | ICD-10-CM | POA: Insufficient documentation

## 2012-06-23 DIAGNOSIS — E669 Obesity, unspecified: Secondary | ICD-10-CM | POA: Insufficient documentation

## 2012-06-23 DIAGNOSIS — R34 Anuria and oliguria: Secondary | ICD-10-CM | POA: Insufficient documentation

## 2012-06-23 DIAGNOSIS — Z862 Personal history of diseases of the blood and blood-forming organs and certain disorders involving the immune mechanism: Secondary | ICD-10-CM | POA: Insufficient documentation

## 2012-06-23 DIAGNOSIS — Z8639 Personal history of other endocrine, nutritional and metabolic disease: Secondary | ICD-10-CM | POA: Insufficient documentation

## 2012-06-23 DIAGNOSIS — R112 Nausea with vomiting, unspecified: Secondary | ICD-10-CM | POA: Insufficient documentation

## 2012-06-23 DIAGNOSIS — Z79899 Other long term (current) drug therapy: Secondary | ICD-10-CM | POA: Insufficient documentation

## 2012-06-23 DIAGNOSIS — Z85038 Personal history of other malignant neoplasm of large intestine: Secondary | ICD-10-CM | POA: Insufficient documentation

## 2012-06-23 DIAGNOSIS — G8929 Other chronic pain: Secondary | ICD-10-CM | POA: Insufficient documentation

## 2012-06-23 DIAGNOSIS — R51 Headache: Secondary | ICD-10-CM | POA: Insufficient documentation

## 2012-06-23 DIAGNOSIS — R42 Dizziness and giddiness: Secondary | ICD-10-CM | POA: Insufficient documentation

## 2012-06-23 DIAGNOSIS — F319 Bipolar disorder, unspecified: Secondary | ICD-10-CM | POA: Insufficient documentation

## 2012-06-23 LAB — CBC WITH DIFFERENTIAL/PLATELET
Basophils Absolute: 0 10*3/uL (ref 0.0–0.1)
Basophils Relative: 0 % (ref 0–1)
Eosinophils Absolute: 0.3 10*3/uL (ref 0.0–0.7)
Eosinophils Relative: 2 % (ref 0–5)
HCT: 35.7 % — ABNORMAL LOW (ref 36.0–46.0)
Hemoglobin: 11.6 g/dL — ABNORMAL LOW (ref 12.0–15.0)
Lymphocytes Relative: 22 % (ref 12–46)
Lymphs Abs: 3 10*3/uL (ref 0.7–4.0)
MCH: 26.1 pg (ref 26.0–34.0)
MCHC: 32.5 g/dL (ref 30.0–36.0)
MCV: 80.2 fL (ref 78.0–100.0)
Monocytes Absolute: 0.5 10*3/uL (ref 0.1–1.0)
Monocytes Relative: 4 % (ref 3–12)
Neutro Abs: 10 10*3/uL — ABNORMAL HIGH (ref 1.7–7.7)
Neutrophils Relative %: 72 % (ref 43–77)
Platelets: 378 10*3/uL (ref 150–400)
RBC: 4.45 MIL/uL (ref 3.87–5.11)
RDW: 14.2 % (ref 11.5–15.5)
WBC: 13.8 10*3/uL — ABNORMAL HIGH (ref 4.0–10.5)

## 2012-06-23 LAB — BASIC METABOLIC PANEL
BUN: 8 mg/dL (ref 6–23)
CO2: 28 mEq/L (ref 19–32)
Calcium: 8.9 mg/dL (ref 8.4–10.5)
Chloride: 104 mEq/L (ref 96–112)
Creatinine, Ser: 0.99 mg/dL (ref 0.50–1.10)
GFR calc Af Amer: 86 mL/min — ABNORMAL LOW (ref >90)
GFR calc non Af Amer: 74 mL/min — ABNORMAL LOW (ref >90)
Glucose, Bld: 101 mg/dL — ABNORMAL HIGH (ref 70–99)
Potassium: 3.4 mEq/L — ABNORMAL LOW (ref 3.5–5.1)
Sodium: 140 mEq/L (ref 135–145)

## 2012-06-23 LAB — URINALYSIS, ROUTINE W REFLEX MICROSCOPIC
Bilirubin Urine: NEGATIVE
Glucose, UA: NEGATIVE mg/dL
Hgb urine dipstick: NEGATIVE
Ketones, ur: NEGATIVE mg/dL
Leukocytes, UA: NEGATIVE
Nitrite: NEGATIVE
Protein, ur: NEGATIVE mg/dL
Specific Gravity, Urine: 1.025 (ref 1.005–1.030)
Urobilinogen, UA: 0.2 mg/dL (ref 0.0–1.0)
pH: 6 (ref 5.0–8.0)

## 2012-06-23 MED ORDER — PROMETHAZINE HCL 25 MG/ML IJ SOLN
12.5000 mg | Freq: Once | INTRAMUSCULAR | Status: AC
  Administered 2012-06-23: 12.5 mg via INTRAVENOUS
  Filled 2012-06-23: qty 1

## 2012-06-23 MED ORDER — SODIUM CHLORIDE 0.9 % IV SOLN
Freq: Once | INTRAVENOUS | Status: AC
  Administered 2012-06-23: 23:00:00 via INTRAVENOUS

## 2012-06-23 NOTE — ED Notes (Signed)
Pt requesting pain med for headache and lower abd pain, ERPA notified

## 2012-06-23 NOTE — ED Provider Notes (Signed)
History     CSN: [id number]  Arrival date & time 06/23/12  2103   First MD Initiated Contact with Patient 06/23/12 2153      Chief Complaint  Patient presents with  . Nausea    (Consider location/radiation/quality/duration/timing/severity/associated sxs/prior treatment) Patient is a 32 y.o. female presenting with diarrhea. The history is provided by the patient.  Diarrhea Quality:  Watery Severity:  Moderate Onset quality:  Gradual Duration:  3 days Timing:  Sporadic Progression:  Worsening Relieved by:  Nothing Exacerbated by: solid foods. Ineffective treatments:  None tried Associated symptoms: abdominal pain, chills, headaches and myalgias  Fever: ?    Brenda Richardson is a 32 y.o. female who presents to the ED with diarrhea and nausea. She states that for the past 3 days every time she eats she has yellow watery diarrhea and even when she doesn't eat she has at least 6 stools per day. She has had headache, feeling weak and feels like she wants to vomit but hasn't. She last tried to eat chicken tonight and felt the nausea and had diarrhea. She is a diabetic controlled with diet.    Past Medical History  Diagnosis Date  . Breast infection 04/04/2010    hospitalized, given antibiotics, referred to surgeon  . Bipolar disorder   . Vaginitis 04/04/2010  . Anemia   . Migraine headache   . Hemorrhoids, internal   . Obesity   . Diabetes mellitus   . Colonic adenoma   . Helicobacter pylori (H. pylori)     s/p treatment  . Anxiety   . Complication of anesthesia   . PONV (postoperative nausea and vomiting)   . Diverticulitis   . Chronic pain   . Vitamin B12 deficiency     Past Surgical History  Procedure Laterality Date  . Cholecystectomy    . Tubal ligation    . Appendectomy  2008    lower abd pain  . S/p hysterectomy  08/2007    No Cancer Cells  . Breast reduction surgery  08/2008    from a 44 DDD to a 44 C  . Bilateral salpingoophorectomy  06/2009  . Tumor  removal      from left shoulder  . Colonoscopy  01/04/10    tubular adenoma of cecum, normal TI, patchy fibrotic changes involving sigm, desc s/p bx neg. Next TCS 12/2016  . Esophagogastroduodenoscopy  01/04/10    normal esophagus s/p dilation, small hh, H pylori gastritis  . Esophagogastroduodenoscopy  2008    florid gastroesophageal reflux during exam but normal mucosa  . Abdominal hysterectomy    . Wisdom tooth extraction      Family History  Problem Relation Age of Onset  . Cancer Mother     cervical, deceased age 23  . Cancer Father     lung cancer, deceased age 58  . Anesthesia problems Neg Hx   . Hypotension Neg Hx   . Malignant hyperthermia Neg Hx   . Pseudochol deficiency Neg Hx     History  Substance Use Topics  . Smoking status: Never Smoker   . Smokeless tobacco: Never Used  . Alcohol Use: No    OB History   Grav Para Term Preterm Abortions TAB SAB Ect Mult Living                  Review of Systems  Constitutional: Positive for chills. Negative for appetite change. Fever: ?  HENT: Negative for congestion.   Respiratory: Negative for  chest tightness and shortness of breath.   Cardiovascular: Negative for chest pain and leg swelling.  Gastrointestinal: Positive for nausea, abdominal pain and diarrhea.  Genitourinary: Positive for decreased urine volume. Negative for dysuria.  Musculoskeletal: Positive for myalgias. Negative for back pain and gait problem.  Skin: Negative for rash.  Allergic/Immunologic: Negative for immunocompromised state.  Neurological: Positive for light-headedness and headaches. Negative for numbness.  Psychiatric/Behavioral: Negative for confusion. The patient is not nervous/anxious.     Allergies  Demerol; Prednisone; Tomato; Cephalosporins; Dust mite extract; Ketorolac tromethamine; Lipitor; Nalbuphine; Pineapple; Tramadol hcl; Vicodin; Zofran; and Aspirin  Home Medications   Current Outpatient Rx  Name  Route  Sig  Dispense   Refill  . ALPRAZolam (XANAX) 1 MG tablet   Oral   Take 1 mg by mouth 2 (two) times daily. For anxiety         . carisoprodol (SOMA) 350 MG tablet   Oral   Take 350 mg by mouth 4 (four) times daily as needed for muscle spasms.         . Cyanocobalamin (VITAMIN B-12 IJ)   Injection   Inject as directed every 30 (thirty) days.         Marland Kitchen levocetirizine (XYZAL) 5 MG tablet   Oral   Take 5 mg by mouth daily.         Marland Kitchen lisinopril (PRINIVIL,ZESTRIL) 5 MG tablet   Oral   Take 5 mg by mouth every morning.          . pravastatin (PRAVACHOL) 20 MG tablet   Oral   Take 20 mg by mouth daily.         Marland Kitchen venlafaxine (EFFEXOR) 75 MG tablet   Oral   Take 75-150 mg by mouth 2 (two) times daily. **Take one tablet in the morning and take two tablets at bedtime**         . verapamil (COVERA HS) 240 MG (CO) 24 hr tablet   Oral   Take 240 mg by mouth every morning.          . zolpidem (AMBIEN) 10 MG tablet   Oral   Take 10 mg by mouth at bedtime as needed for sleep.            BP 120/74  Pulse 82  Temp(Src) 98.1 F (36.7 C) (Oral)  Resp 20  Ht 5\' 6"  (1.676 m)  Wt 248 lb (112.492 kg)  BMI 40.05 kg/m2  SpO2 99%  Physical Exam  Nursing note and vitals reviewed. Constitutional: She is oriented to person, place, and time. No distress.  Obese A/A female   HENT:  Head: Normocephalic.  Eyes: Conjunctivae and EOM are normal.  Neck: Normal range of motion. Neck supple.  Cardiovascular: Normal rate, regular rhythm and normal heart sounds.   Pulmonary/Chest: Effort normal and breath sounds normal.  Abdominal: Soft. Bowel sounds are normal. There is tenderness in the right lower quadrant, epigastric area and left lower quadrant. There is no rigidity, no rebound, no guarding and no CVA tenderness.  Musculoskeletal: Normal range of motion.  Neurological: She is alert and oriented to person, place, and time. No cranial nerve deficit.  Skin: Skin is warm and dry.  Psychiatric:  She has a normal mood and affect. Her behavior is normal. Judgment and thought content normal.   Results for orders placed during the hospital encounter of 06/23/12 (from the past 24 hour(s))  CBC WITH DIFFERENTIAL     Status: Abnormal   Collection  Time    06/23/12 10:26 PM      Result Value Range   WBC 13.8 (*) 4.0 - 10.5 K/uL   RBC 4.45  3.87 - 5.11 MIL/uL   Hemoglobin 11.6 (*) 12.0 - 15.0 g/dL   HCT 40.9 (*) 81.1 - 91.4 %   MCV 80.2  78.0 - 100.0 fL   MCH 26.1  26.0 - 34.0 pg   MCHC 32.5  30.0 - 36.0 g/dL   RDW 78.2  95.6 - 21.3 %   Platelets 378  150 - 400 K/uL   Neutrophils Relative 72  43 - 77 %   Neutro Abs 10.0 (*) 1.7 - 7.7 K/uL   Lymphocytes Relative 22  12 - 46 %   Lymphs Abs 3.0  0.7 - 4.0 K/uL   Monocytes Relative 4  3 - 12 %   Monocytes Absolute 0.5  0.1 - 1.0 K/uL   Eosinophils Relative 2  0 - 5 %   Eosinophils Absolute 0.3  0.0 - 0.7 K/uL   Basophils Relative 0  0 - 1 %   Basophils Absolute 0.0  0.0 - 0.1 K/uL  BASIC METABOLIC PANEL     Status: Abnormal   Collection Time    06/23/12 10:26 PM      Result Value Range   Sodium 140  135 - 145 mEq/L   Potassium 3.4 (*) 3.5 - 5.1 mEq/L   Chloride 104  96 - 112 mEq/L   CO2 28  19 - 32 mEq/L   Glucose, Bld 101 (*) 70 - 99 mg/dL   BUN 8  6 - 23 mg/dL   Creatinine, Ser 0.86  0.50 - 1.10 mg/dL   Calcium 8.9  8.4 - 57.8 mg/dL   GFR calc non Af Amer 74 (*) >90 mL/min   GFR calc Af Amer 86 (*) >90 mL/min  URINALYSIS, ROUTINE W REFLEX MICROSCOPIC     Status: None   Collection Time    06/23/12 10:37 PM      Result Value Range   Color, Urine YELLOW  YELLOW   APPearance CLEAR  CLEAR   Specific Gravity, Urine 1.025  1.005 - 1.030   pH 6.0  5.0 - 8.0   Glucose, UA NEGATIVE  NEGATIVE mg/dL   Hgb urine dipstick NEGATIVE  NEGATIVE   Bilirubin Urine NEGATIVE  NEGATIVE   Ketones, ur NEGATIVE  NEGATIVE mg/dL   Protein, ur NEGATIVE  NEGATIVE mg/dL   Urobilinogen, UA 0.2  0.0 - 1.0 mg/dL   Nitrite NEGATIVE  NEGATIVE    Leukocytes, UA NEGATIVE  NEGATIVE    Assessment: 32 y.o. female with nausea and diarrhea x 3 days   Viral illness  Plan:  IV hydration   Phenergan 12.5 mg IV   Clear liquids  ED Course  Procedures (including critical care time) Re evaluation @ 00:40 patient sleeping, has not been to the bathroom except to get a urine since arrival. I woke patient and discussed lab findings and plan of care. Patient voices understanding.   MDM  I have reviewed this patient's vital signs, nurses notes, appropriate labs and discussed with patient.  Patient is stable for discharge without immediate complications. She is to follow up with PCP. She will return if symptoms do not improve or worsen.    Medication List    TAKE these medications       promethazine 25 MG tablet  Commonly known as:  PHENERGAN  Take 1 tablet (25 mg total) by mouth every  6 (six) hours as needed for nausea.      ASK your doctor about these medications       ALPRAZolam 1 MG tablet  Commonly known as:  XANAX  Take 1 mg by mouth 2 (two) times daily. For anxiety     carisoprodol 350 MG tablet  Commonly known as:  SOMA  Take 350 mg by mouth 4 (four) times daily as needed for muscle spasms.     levocetirizine 5 MG tablet  Commonly known as:  XYZAL  Take 5 mg by mouth daily.     lisinopril 5 MG tablet  Commonly known as:  PRINIVIL,ZESTRIL  Take 5 mg by mouth every morning.     pravastatin 20 MG tablet  Commonly known as:  PRAVACHOL  Take 20 mg by mouth daily.     venlafaxine 75 MG tablet  Commonly known as:  EFFEXOR  Take 75-150 mg by mouth 2 (two) times daily. **Take one tablet in the morning and take two tablets at bedtime**     verapamil 240 MG (CO) 24 hr tablet  Commonly known as:  COVERA HS  Take 240 mg by mouth every morning.     VITAMIN B-12 IJ  Inject as directed every 30 (thirty) days.     zolpidem 10 MG tablet  Commonly known as:  AMBIEN  Take 10 mg by mouth at bedtime as needed for sleep.              [address] [place], [place] 06/24/12 [phone]

## 2012-06-23 NOTE — ED Notes (Signed)
Nausea and diarrhea.  

## 2012-06-24 MED ORDER — PROMETHAZINE HCL 25 MG PO TABS: 25.0000 mg | ORAL_TABLET | Freq: Four times a day (QID) | ORAL | Status: DC | PRN

## 2012-06-24 NOTE — ED Notes (Signed)
dozing 

## 2012-06-24 NOTE — ED Provider Notes (Signed)
Medical screening examination/treatment/procedure(s) were performed by non-physician practitioner and as supervising physician I was immediately available for consultation/collaboration.   Shelda Jakes, MD 06/24/12 1154

## 2012-06-24 NOTE — ED Notes (Signed)
No nausea/vomiting/diarrhea noted while here in ED

## 2012-07-29 ENCOUNTER — Emergency Department (HOSPITAL_COMMUNITY)
Admission: EM | Admit: 2012-07-29 | Discharge: 2012-07-29 | Disposition: A | Discharge status: Home / Self Care | Payer: PRIVATE HEALTH INSURANCE | Attending: Emergency Medicine | Admitting: Emergency Medicine

## 2012-07-29 ENCOUNTER — Encounter (HOSPITAL_COMMUNITY): Payer: Self-pay | Admitting: Emergency Medicine

## 2012-07-29 ENCOUNTER — Emergency Department (HOSPITAL_COMMUNITY): Payer: PRIVATE HEALTH INSURANCE

## 2012-07-29 ENCOUNTER — Other Ambulatory Visit: Payer: Self-pay

## 2012-07-29 DIAGNOSIS — D649 Anemia, unspecified: Secondary | ICD-10-CM | POA: Insufficient documentation

## 2012-07-29 DIAGNOSIS — R61 Generalized hyperhidrosis: Secondary | ICD-10-CM | POA: Insufficient documentation

## 2012-07-29 DIAGNOSIS — R209 Unspecified disturbances of skin sensation: Secondary | ICD-10-CM | POA: Insufficient documentation

## 2012-07-29 DIAGNOSIS — E669 Obesity, unspecified: Secondary | ICD-10-CM | POA: Insufficient documentation

## 2012-07-29 DIAGNOSIS — G8929 Other chronic pain: Secondary | ICD-10-CM | POA: Insufficient documentation

## 2012-07-29 DIAGNOSIS — E119 Type 2 diabetes mellitus without complications: Secondary | ICD-10-CM | POA: Insufficient documentation

## 2012-07-29 DIAGNOSIS — R0602 Shortness of breath: Secondary | ICD-10-CM | POA: Insufficient documentation

## 2012-07-29 DIAGNOSIS — R42 Dizziness and giddiness: Secondary | ICD-10-CM | POA: Insufficient documentation

## 2012-07-29 DIAGNOSIS — E538 Deficiency of other specified B group vitamins: Secondary | ICD-10-CM | POA: Insufficient documentation

## 2012-07-29 DIAGNOSIS — F319 Bipolar disorder, unspecified: Secondary | ICD-10-CM | POA: Insufficient documentation

## 2012-07-29 DIAGNOSIS — G43909 Migraine, unspecified, not intractable, without status migrainosus: Secondary | ICD-10-CM | POA: Insufficient documentation

## 2012-07-29 DIAGNOSIS — R079 Chest pain, unspecified: Secondary | ICD-10-CM | POA: Insufficient documentation

## 2012-07-29 DIAGNOSIS — Z8619 Personal history of other infectious and parasitic diseases: Secondary | ICD-10-CM | POA: Insufficient documentation

## 2012-07-29 DIAGNOSIS — Z8719 Personal history of other diseases of the digestive system: Secondary | ICD-10-CM | POA: Insufficient documentation

## 2012-07-29 DIAGNOSIS — Z79899 Other long term (current) drug therapy: Secondary | ICD-10-CM | POA: Insufficient documentation

## 2012-07-29 DIAGNOSIS — Z8679 Personal history of other diseases of the circulatory system: Secondary | ICD-10-CM | POA: Insufficient documentation

## 2012-07-29 DIAGNOSIS — F411 Generalized anxiety disorder: Secondary | ICD-10-CM | POA: Insufficient documentation

## 2012-07-29 DIAGNOSIS — Z8742 Personal history of other diseases of the female genital tract: Secondary | ICD-10-CM | POA: Insufficient documentation

## 2012-07-29 LAB — BASIC METABOLIC PANEL
BUN: 11 mg/dL (ref 6–23)
CO2: 26 mEq/L (ref 19–32)
Calcium: 9.2 mg/dL (ref 8.4–10.5)
Chloride: 102 mEq/L (ref 96–112)
Creatinine, Ser: 0.85 mg/dL (ref 0.50–1.10)
GFR calc Af Amer: >90 mL/min (ref >90)
GFR calc non Af Amer: 89 mL/min — ABNORMAL LOW (ref >90)
Glucose, Bld: 118 mg/dL — ABNORMAL HIGH (ref 70–99)
Potassium: 3.7 mEq/L (ref 3.5–5.1)
Sodium: 139 mEq/L (ref 135–145)

## 2012-07-29 LAB — CBC
HCT: 38 % (ref 36.0–46.0)
Hemoglobin: 12.3 g/dL (ref 12.0–15.0)
MCH: 26.1 pg (ref 26.0–34.0)
MCHC: 32.4 g/dL (ref 30.0–36.0)
MCV: 80.7 fL (ref 78.0–100.0)
Platelets: 418 10*3/uL — ABNORMAL HIGH (ref 150–400)
RBC: 4.71 MIL/uL (ref 3.87–5.11)
RDW: 14.2 % (ref 11.5–15.5)
WBC: 12.7 10*3/uL — ABNORMAL HIGH (ref 4.0–10.5)

## 2012-07-29 LAB — TROPONIN I: Troponin I: <0.3 ng/mL (ref <0.30)

## 2012-07-29 NOTE — ED Notes (Signed)
Patient complaining of sudden onset of chest pain with shortness of breath, dizziness, diaphoresis, and numbness and tingling to hands bilaterally.

## 2012-07-29 NOTE — ED Notes (Signed)
Pt states she is very upset that she has been evaluated several times for her chest pain and "they never find anything wrong with me"    Pt states "I want to know all of my test results and since nobody did anything for me, I'm not signing for any discharge paperwork"  Pt did not sign for discharge at this time.  I offered to get the er doctor to come and tell her that all of her tests were normal tonight and that it is safe to go home.  Pt denied that offer, and got dressed and walked out of dept.

## 2012-07-29 NOTE — ED Notes (Signed)
Pt c/o chest pain that began tonight while at church. Pt also reports SOB, dizziness, and tingling in hands bilaterally. Pt states symptoms has decreased since onset but "wants to get checked out".

## 2012-07-29 NOTE — ED Provider Notes (Signed)
History    Scribed for Brenda Hutching, MD, the patient was seen in room APA06/APA06. This chart was scribed by Lewanda Rife, ED scribe. Patient's care was started at 2127    CSN: [id number]  Arrival date & time 07/29/12  2108   None     Chief Complaint  Patient presents with  . Chest Pain  . Numbness    (Consider location/radiation/quality/duration/timing/severity/associated sxs/prior treatment) HPI HPI Comments: Brenda Richardson is a 32 y.o. female who presents to the Emergency Department complaining of waxing and waning mild chest pain with acute onset while at church. Describes pain as stabbing and sharp. Reports chest pains last 3-4 seconds each time. Reports chest pain with radiation, numbness and tingling to bilateral hands. Reports increased dizziness, diaphoresis, and shortness of breath. Denies any aggravating or alleviating factors. Reports hx hypertension, diabetes mellitus type 2, and B12 deficiency.   Family hx: Reports sister at age 4 died of COPD. Reports cardiac hx on father's side. Past Medical History  Diagnosis Date  . Breast infection 04/04/2010    hospitalized, given antibiotics, referred to surgeon  . Bipolar disorder   . Vaginitis 04/04/2010  . Anemia   . Migraine headache   . Hemorrhoids, internal   . Obesity   . Diabetes mellitus   . Colonic adenoma   . Helicobacter pylori (H. pylori)     s/p treatment  . Anxiety   . Complication of anesthesia   . PONV (postoperative nausea and vomiting)   . Diverticulitis   . Chronic pain   . Vitamin B12 deficiency     Past Surgical History  Procedure Laterality Date  . Cholecystectomy    . Tubal ligation    . Appendectomy  2008    lower abd pain  . S/p hysterectomy  08/2007    No Cancer Cells  . Breast reduction surgery  08/2008    from a 44 DDD to a 44 C  . Bilateral salpingoophorectomy  06/2009  . Tumor removal      from left shoulder  . Colonoscopy  01/04/10    tubular adenoma of cecum, normal  TI, patchy fibrotic changes involving sigm, desc s/p bx neg. Next TCS 12/2016  . Esophagogastroduodenoscopy  01/04/10    normal esophagus s/p dilation, small hh, H pylori gastritis  . Esophagogastroduodenoscopy  2008    florid gastroesophageal reflux during exam but normal mucosa  . Abdominal hysterectomy    . Wisdom tooth extraction      Family History  Problem Relation Age of Onset  . Cancer Mother     cervical, deceased age 33  . Cancer Father     lung cancer, deceased age 65  . Anesthesia problems Neg Hx   . Hypotension Neg Hx   . Malignant hyperthermia Neg Hx   . Pseudochol deficiency Neg Hx     History  Substance Use Topics  . Smoking status: Never Smoker   . Smokeless tobacco: Never Used  . Alcohol Use: No    OB History   Grav Para Term Preterm Abortions TAB SAB Ect Mult Living                  Review of Systems  Cardiovascular: Positive for chest pain.  Neurological: Positive for numbness.  All other systems reviewed and are negative.   A complete 10 system review of systems was obtained and all systems are negative except as noted in the HPI and PMH.    Allergies  Demerol; Prednisone;  Tomato; Cephalosporins; Dust mite extract; Ketorolac tromethamine; Lipitor; Nalbuphine; Pineapple; Tramadol hcl; Vicodin; Zofran; and Aspirin  Home Medications   Current Outpatient Rx  Name  Route  Sig  Dispense  Refill  . ALPRAZolam (XANAX) 1 MG tablet   Oral   Take 1 mg by mouth 2 (two) times daily. For anxiety         . carisoprodol (SOMA) 350 MG tablet   Oral   Take 350 mg by mouth 4 (four) times daily as needed for muscle spasms.         . Cyanocobalamin (VITAMIN B-12 IJ)   Injection   Inject as directed every 30 (thirty) days.         Marland Kitchen levocetirizine (XYZAL) 5 MG tablet   Oral   Take 5 mg by mouth daily.         Marland Kitchen lisinopril (PRINIVIL,ZESTRIL) 5 MG tablet   Oral   Take 5 mg by mouth every morning.          . pravastatin (PRAVACHOL) 20 MG  tablet   Oral   Take 20 mg by mouth daily.         . promethazine (PHENERGAN) 25 MG tablet   Oral   Take 1 tablet (25 mg total) by mouth every 6 (six) hours as needed for nausea.   30 tablet   0   . venlafaxine (EFFEXOR) 75 MG tablet   Oral   Take 75-150 mg by mouth 2 (two) times daily. **Take one tablet in the morning and take two tablets at bedtime**         . verapamil (COVERA HS) 240 MG (CO) 24 hr tablet   Oral   Take 240 mg by mouth every morning.          . zolpidem (AMBIEN) 10 MG tablet   Oral   Take 10 mg by mouth at bedtime as needed for sleep.            There were no vitals taken for this visit.  Physical Exam  Nursing note and vitals reviewed. Constitutional: She is oriented to person, place, and time. She appears well-developed and well-nourished.  Obesity   HENT:  Head: Normocephalic and atraumatic.  Eyes: Conjunctivae and EOM are normal. Pupils are equal, round, and reactive to light.  Neck: Normal range of motion. Neck supple.  Cardiovascular: Normal rate, regular rhythm and normal heart sounds.   Pulmonary/Chest: Effort normal and breath sounds normal.  Abdominal: Soft. Bowel sounds are normal.  Musculoskeletal: Normal range of motion.  Neurological: She is alert and oriented to person, place, and time.  Skin: Skin is warm and dry.  Psychiatric: She has a normal mood and affect.    ED Course  Procedures (including critical care time) Medications - No data to display  Labs Reviewed  CBC - Abnormal; Notable for the following:    WBC 12.7 (*)    Platelets 418 (*)    All other components within normal limits  BASIC METABOLIC PANEL - Abnormal; Notable for the following:    Glucose, Bld 118 (*)    GFR calc non Af Amer 89 (*)    All other components within normal limits  TROPONIN I   No results found.   No diagnosis found.   Date: 07/29/2012  Rate: 98  Rhythm: normal sinus rhythm  QRS Axis: normal  Intervals: normal  ST/T Wave  abnormalities: normal  Conduction Disutrbances: none  Narrative Interpretation: unremarkable  MDM  History and physical atypical for ACS or PE.   Vital signs normal .  EKG and troponin negative      I personally performed the services described in this documentation, which was scribed in my presence. The recorded information has been reviewed and is accurate.    Brenda Hutching, MD 07/29/12 [phone]

## 2012-08-06 ENCOUNTER — Encounter (HOSPITAL_BASED_OUTPATIENT_CLINIC_OR_DEPARTMENT_OTHER): Payer: PRIVATE HEALTH INSURANCE | Attending: Internal Medicine

## 2012-08-06 DIAGNOSIS — S90919A Unspecified superficial injury of unspecified ankle, initial encounter: Secondary | ICD-10-CM | POA: Insufficient documentation

## 2012-08-06 DIAGNOSIS — Y9301 Activity, walking, marching and hiking: Secondary | ICD-10-CM | POA: Insufficient documentation

## 2012-08-06 DIAGNOSIS — S70929A Unspecified superficial injury of unspecified thigh, initial encounter: Secondary | ICD-10-CM | POA: Insufficient documentation

## 2012-08-06 DIAGNOSIS — Z79899 Other long term (current) drug therapy: Secondary | ICD-10-CM | POA: Insufficient documentation

## 2012-08-06 DIAGNOSIS — S70919A Unspecified superficial injury of unspecified hip, initial encounter: Secondary | ICD-10-CM | POA: Insufficient documentation

## 2012-08-06 DIAGNOSIS — S80929A Unspecified superficial injury of unspecified lower leg, initial encounter: Secondary | ICD-10-CM | POA: Insufficient documentation

## 2012-08-06 DIAGNOSIS — W19XXXA Unspecified fall, initial encounter: Secondary | ICD-10-CM | POA: Insufficient documentation

## 2012-08-06 DIAGNOSIS — E119 Type 2 diabetes mellitus without complications: Secondary | ICD-10-CM | POA: Insufficient documentation

## 2012-08-06 LAB — GLUCOSE, CAPILLARY: Glucose-Capillary: 101 mg/dL — ABNORMAL HIGH (ref 70–99)

## 2012-08-07 NOTE — Progress Notes (Signed)
[facility]  NAMESAKAI, Brenda Richardson              ACCOUNT NO.:  [id number]  MEDICAL RECORD NO.:  [id number]      DATE OF BIRTH:  06-09-1980  PHYSICIAN:  Maxwell Caul, M.D. VISIT DATE:  08/06/2012                                  OFFICE VISIT   HISTORY:  This is a 32 year old type 2 diabetic who was walking holding on to an young child and fell on the pavement a week ago.  She suffered some superficial excoriations to her right and left knee and is here for our review of this.  These are somewhat painful.  She does not have a history of diabetic vascular or neuropathy problems that she is aware of.  She has been using Neosporin and topical coverings on this.  PAST MEDICAL HISTORY:  Bipolar affective disorder, migraine headaches, type 2 diabetes, colonic adenoma, Helicobacter pylori positive status post treatment, diverticulitis, chronic pain, vitamin B12 deficiency.  PAST SURGICAL HISTORY:  Cholecystectomy, tubal ligation, appendectomy, hysterectomy, breast reduction surgery, bilateral salpingo oophorectomy, tumor removal from left shoulder.  MEDICATIONS:  Medication list is reviewed.  She is on B12 every 30 days, Xyzal 5 mg daily, lisinopril 5 daily, Pravachol 20 daily, Norco 7.5/3.5 daily.  PHYSICAL EXAMINATION:  Temperature 98.8, pulse 85, blood pressure 142/104.  Capillary blood glucose is 101.  ABIs as calculated in this clinic were 1 bilaterally.  Peripheral pulses were palpable.  There was no evidence of sensory loss via the microfilament test.  Her wounds were large, but superficial areas especially over the right anterior knee versus a left anterior knee, nevertheless these are clean wounds with no evidence of surrounding infection and no depth.  I do not think there is anything to be overly concerned about here.  IMPRESSIONS:  Traumatic wounds in a woman with type 2 diabetes, but no evidence of significant complications.  I think these wounds  can be taken care of with gentle daily cleansing, topical antibiotic cream as she is using to keep these moist and free of infection and protective dressings.  I do not expect any particular difficulties healing these, however, I advised her to call me back in 2 weeks if these are not resolved or if there are further complications.          ______________________________ Maxwell Caul, M.D.     MGR/MEDQ  D:  08/06/2012  T:  08/07/2012  Job:  [id number]

## 2012-09-03 ENCOUNTER — Encounter (HOSPITAL_COMMUNITY): Payer: Self-pay | Admitting: Emergency Medicine

## 2012-09-03 ENCOUNTER — Emergency Department (HOSPITAL_COMMUNITY)
Admission: EM | Admit: 2012-09-03 | Discharge: 2012-09-04 | Disposition: A | Discharge status: Home / Self Care | Payer: PRIVATE HEALTH INSURANCE | Attending: Emergency Medicine | Admitting: Emergency Medicine

## 2012-09-03 DIAGNOSIS — H9319 Tinnitus, unspecified ear: Secondary | ICD-10-CM | POA: Insufficient documentation

## 2012-09-03 DIAGNOSIS — Z8679 Personal history of other diseases of the circulatory system: Secondary | ICD-10-CM | POA: Insufficient documentation

## 2012-09-03 DIAGNOSIS — R569 Unspecified convulsions: Secondary | ICD-10-CM | POA: Insufficient documentation

## 2012-09-03 DIAGNOSIS — R42 Dizziness and giddiness: Secondary | ICD-10-CM | POA: Insufficient documentation

## 2012-09-03 DIAGNOSIS — Z8742 Personal history of other diseases of the female genital tract: Secondary | ICD-10-CM | POA: Insufficient documentation

## 2012-09-03 DIAGNOSIS — R112 Nausea with vomiting, unspecified: Secondary | ICD-10-CM | POA: Insufficient documentation

## 2012-09-03 DIAGNOSIS — E119 Type 2 diabetes mellitus without complications: Secondary | ICD-10-CM | POA: Insufficient documentation

## 2012-09-03 DIAGNOSIS — F319 Bipolar disorder, unspecified: Secondary | ICD-10-CM | POA: Insufficient documentation

## 2012-09-03 DIAGNOSIS — H538 Other visual disturbances: Secondary | ICD-10-CM | POA: Insufficient documentation

## 2012-09-03 DIAGNOSIS — Z8619 Personal history of other infectious and parasitic diseases: Secondary | ICD-10-CM | POA: Insufficient documentation

## 2012-09-03 DIAGNOSIS — Z862 Personal history of diseases of the blood and blood-forming organs and certain disorders involving the immune mechanism: Secondary | ICD-10-CM | POA: Insufficient documentation

## 2012-09-03 DIAGNOSIS — R51 Headache: Secondary | ICD-10-CM | POA: Insufficient documentation

## 2012-09-03 DIAGNOSIS — F411 Generalized anxiety disorder: Secondary | ICD-10-CM | POA: Insufficient documentation

## 2012-09-03 DIAGNOSIS — R0602 Shortness of breath: Secondary | ICD-10-CM | POA: Insufficient documentation

## 2012-09-03 DIAGNOSIS — R0789 Other chest pain: Secondary | ICD-10-CM | POA: Insufficient documentation

## 2012-09-03 DIAGNOSIS — R519 Headache, unspecified: Secondary | ICD-10-CM

## 2012-09-03 DIAGNOSIS — R091 Pleurisy: Secondary | ICD-10-CM | POA: Insufficient documentation

## 2012-09-03 DIAGNOSIS — G43909 Migraine, unspecified, not intractable, without status migrainosus: Secondary | ICD-10-CM | POA: Insufficient documentation

## 2012-09-03 DIAGNOSIS — E669 Obesity, unspecified: Secondary | ICD-10-CM | POA: Insufficient documentation

## 2012-09-03 DIAGNOSIS — Z8719 Personal history of other diseases of the digestive system: Secondary | ICD-10-CM | POA: Insufficient documentation

## 2012-09-03 DIAGNOSIS — Z79899 Other long term (current) drug therapy: Secondary | ICD-10-CM | POA: Insufficient documentation

## 2012-09-03 IMAGING — RF DG ESOPHAGUS
16 of 24 series · 16 of 24 positions shown · non-contrast
Comparison: None

CLINICAL DATA: The proximal to mid thoracic dysphagia, previous
history of esophageal dilatation

BARIUM SWALLOW/ESOPHAGRAM:
TECHNIQUE: Single contrast, air contrast, and tablet imaging of
the esophagus were performed.
Fluoroscopy time:  2.4 minutes

[Series 1: run · 1 of 1 slices shown (1 of 16)]
[im 1/1]
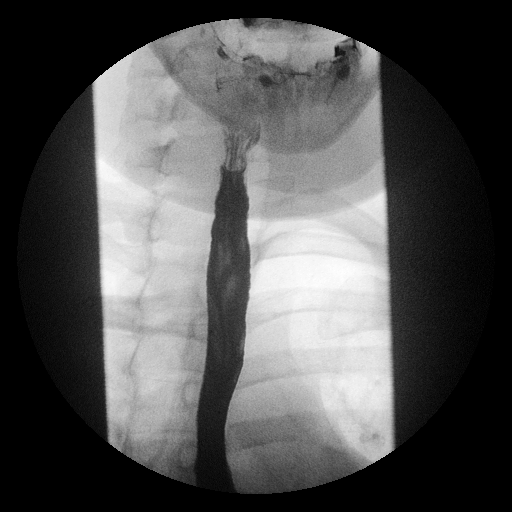

[Series 3: run · 1 of 1 slices shown (2 of 16)]
[im 1/1]
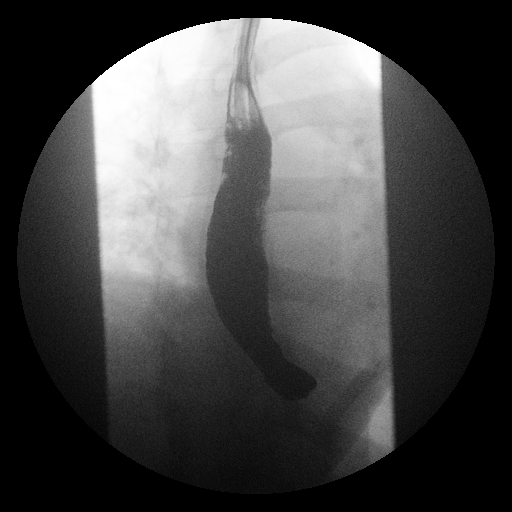

[Series 4: run · 1 of 1 slices shown (3 of 16)]
[im 1/1]
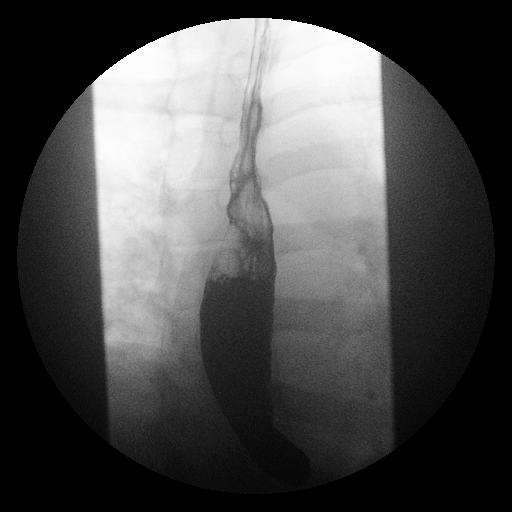

[Series 6: run · 1 of 1 slices shown (4 of 16)]
[im 1/1]
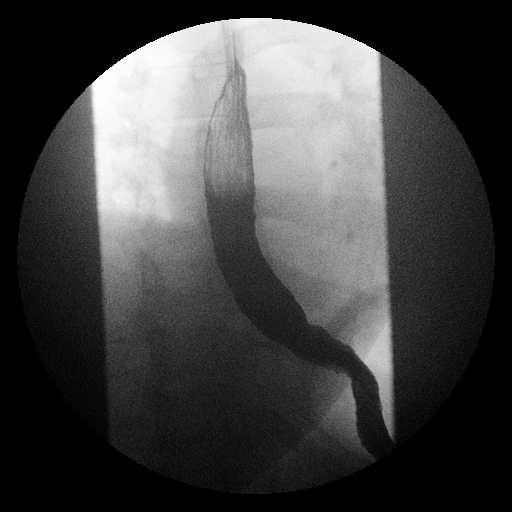

[Series 7: run · 1 of 1 slices shown (5 of 16)]
[im 1/1]
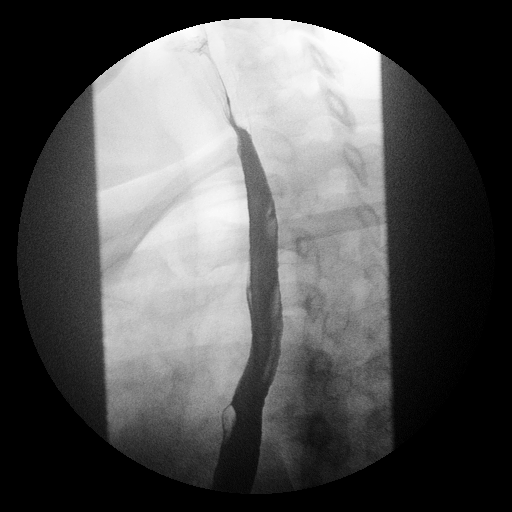

[Series 9: run · 1 of 1 slices shown (6 of 16)]
[im 1/1]
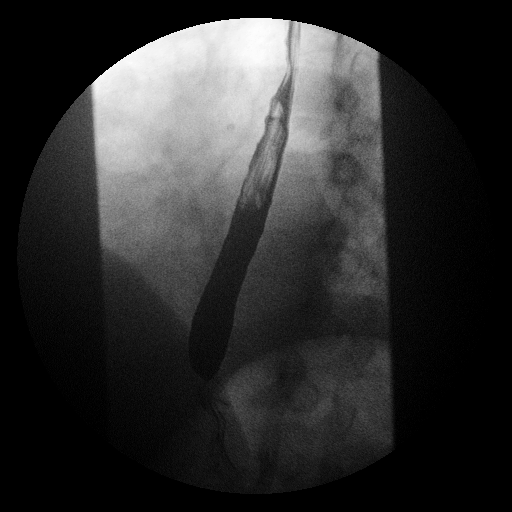

[Series 10: run · 1 of 1 slices shown (7 of 16)]
[im 1/1]
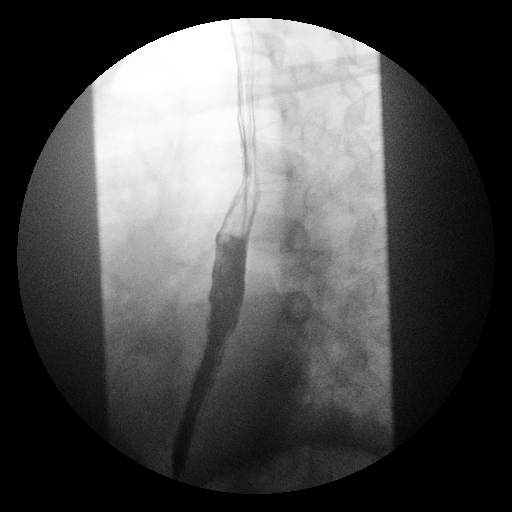

[Series 12: run · 1 of 1 slices shown (8 of 16)]
[im 1/1]
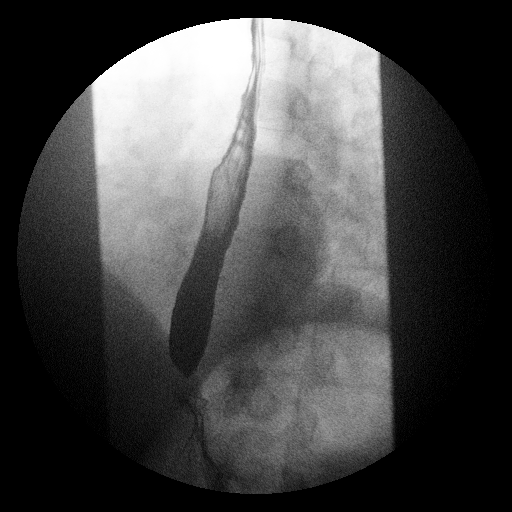

[Series 13: run · 1 of 8 slices shown (9 of 16)]
[im 1/8]
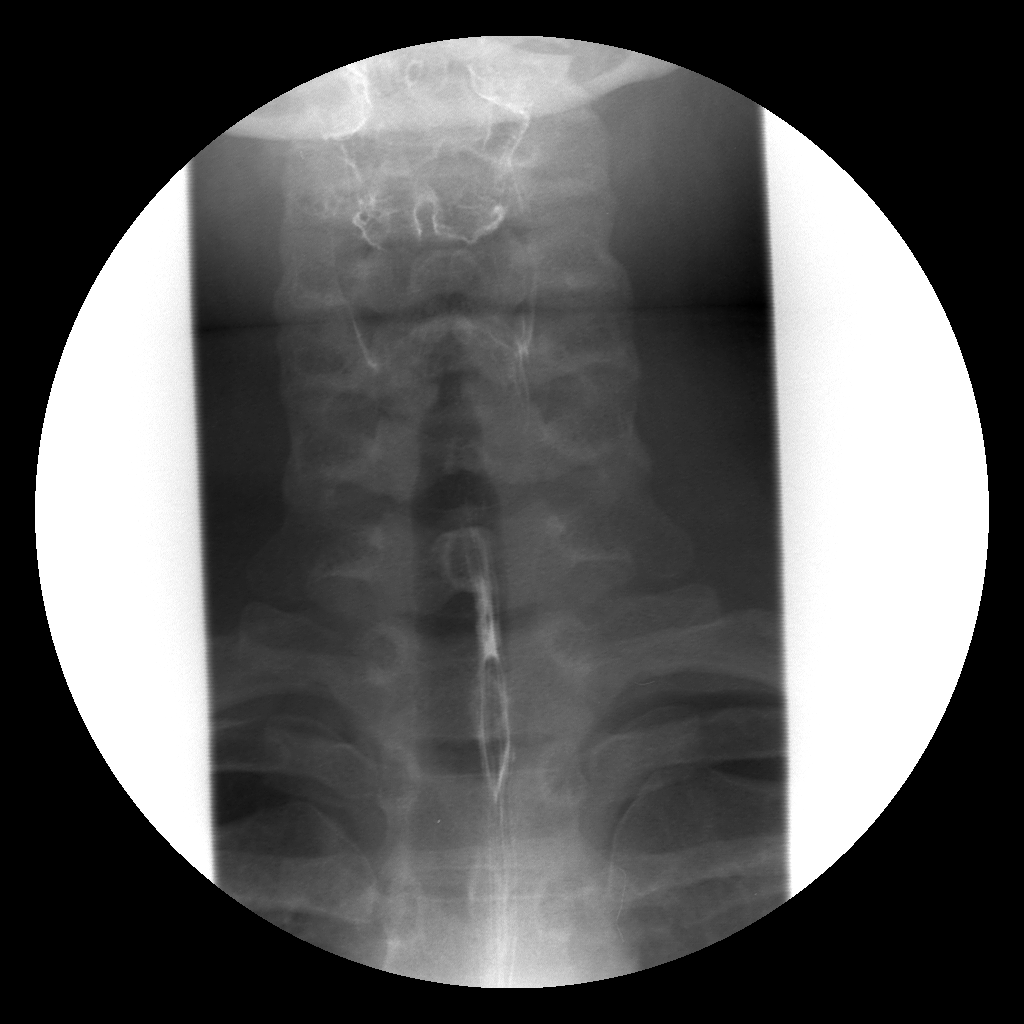

[Series 15: run · 1 of 1 slices shown (10 of 16)]
[im 1/1]
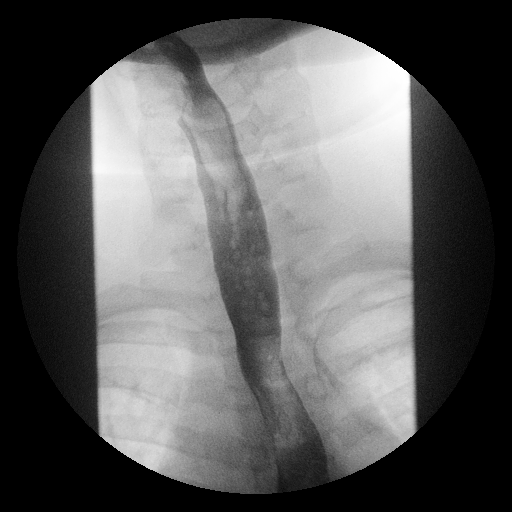

[Series 16: run · 1 of 1 slices shown (11 of 16)]
[im 1/1]
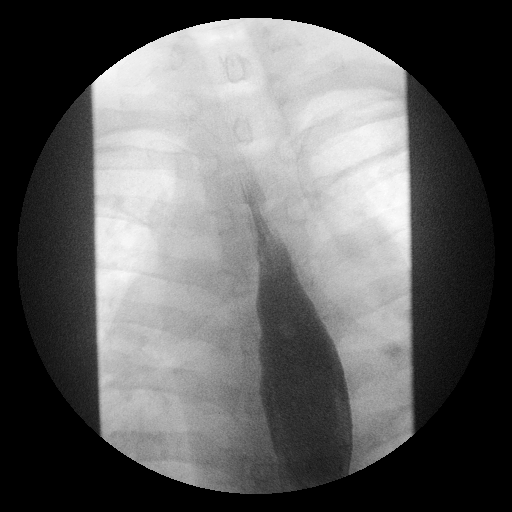

[Series 18: run · 1 of 1 slices shown (12 of 16)]
[im 1/1]
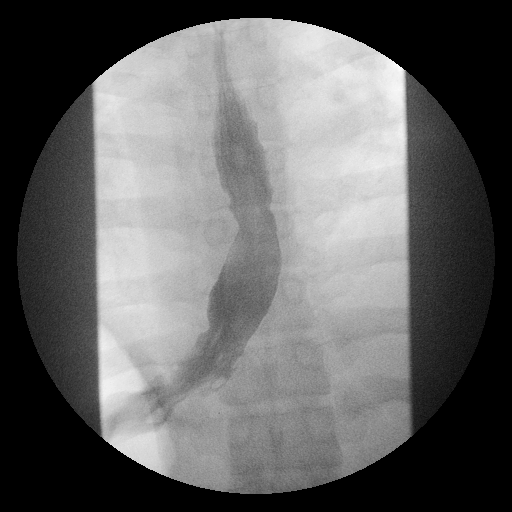

[Series 19: run · 1 of 1 slices shown (13 of 16)]
[im 1/1]
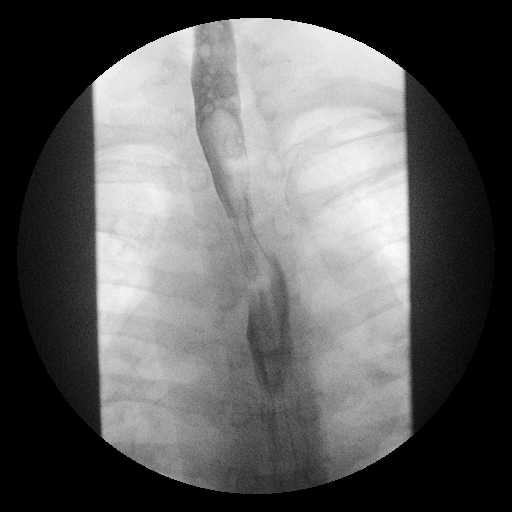

[Series 21: run · 1 of 1 slices shown (14 of 16)]
[im 1/1]
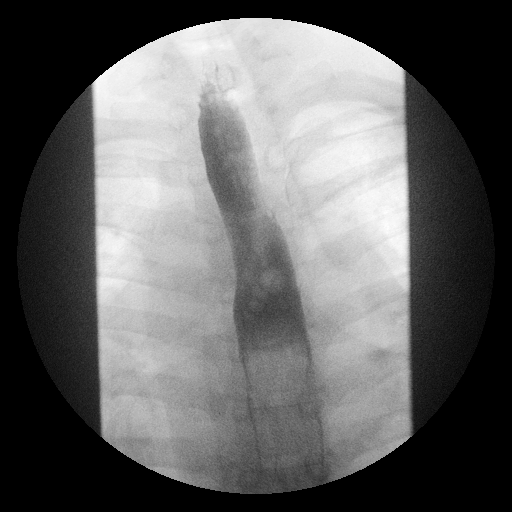

[Series 22: run · 1 of 1 slices shown (15 of 16)]
[im 1/1]
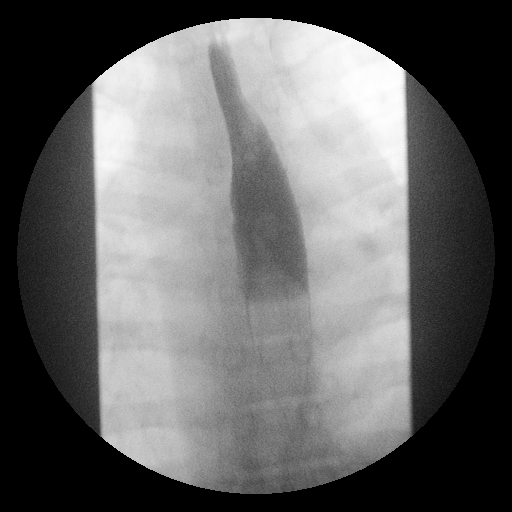

[Series 24: run · 1 of 1 slices shown (16 of 16)]
[im 1/1]
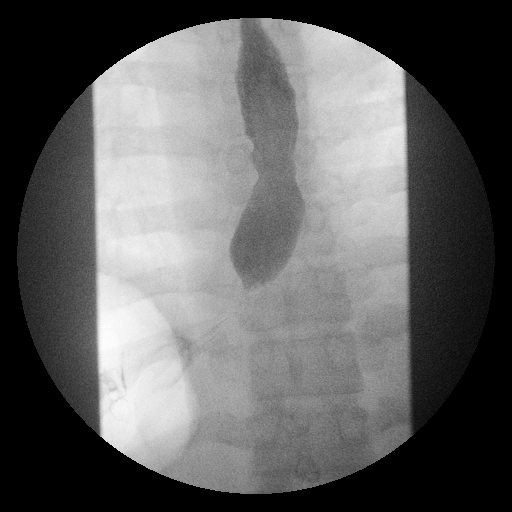

[16 of 24 positions shown; findings below may reference images not displayed]

FINDINGS: Normal esophageal distention and motility.
12.5 mm diameter barium tablet passes from oral cavity to stomach
without delay.
No esophageal stricture, obstruction, or persistent intraluminal
filling defect.
Rapid sequence imaging of cervical esophagus and hypopharynx is
unremarkable.
Esophageal mucosa appears smooth and regular on air contrast images
without irregularity or ulceration.
No hiatal hernia identified.
IMPRESSION: Normal exam.

## 2012-09-03 NOTE — ED Notes (Signed)
Pt states she has a "bad headache" since yesterday and that she also had two seizures last night,  Denies any injury to head,  States was lying on bed at time of seizure,  Past history of pseudo seizures,  Pt is under a lot of stress and states also that her legs were tingly last night,  Pt is alert and oriented in NAD.

## 2012-09-03 NOTE — ED Notes (Signed)
Pt states she is having headache, dizziness, and states her chest hurts  Pt states she has been feeling bad since Monday  Pt states she had 2 seizures yesterday and has not had one prior to that for 2.5 years

## 2012-09-04 ENCOUNTER — Emergency Department (HOSPITAL_COMMUNITY): Payer: PRIVATE HEALTH INSURANCE

## 2012-09-04 LAB — RAPID URINE DRUG SCREEN, HOSP PERFORMED
Amphetamines: NOT DETECTED
Barbiturates: NOT DETECTED
Benzodiazepines: POSITIVE — AB
Cocaine: NOT DETECTED
Opiates: NOT DETECTED
Tetrahydrocannabinol: NOT DETECTED

## 2012-09-04 LAB — BASIC METABOLIC PANEL
BUN: 7 mg/dL (ref 6–23)
CO2: 23 mEq/L (ref 19–32)
Calcium: 8.9 mg/dL (ref 8.4–10.5)
Chloride: 101 mEq/L (ref 96–112)
Creatinine, Ser: 0.65 mg/dL (ref 0.50–1.10)
GFR calc Af Amer: >90 mL/min (ref >90)
GFR calc non Af Amer: >90 mL/min (ref >90)
Glucose, Bld: 93 mg/dL (ref 70–99)
Potassium: 5 mEq/L (ref 3.5–5.1)
Sodium: 135 mEq/L (ref 135–145)

## 2012-09-04 LAB — CBC WITH DIFFERENTIAL/PLATELET
Basophils Absolute: 0 10*3/uL (ref 0.0–0.1)
Basophils Relative: 0 % (ref 0–1)
Eosinophils Absolute: 0.3 10*3/uL (ref 0.0–0.7)
Eosinophils Relative: 2 % (ref 0–5)
HCT: 37.1 % (ref 36.0–46.0)
Hemoglobin: 12.1 g/dL (ref 12.0–15.0)
Lymphocytes Relative: 24 % (ref 12–46)
Lymphs Abs: 2.9 10*3/uL (ref 0.7–4.0)
MCH: 25.8 pg — ABNORMAL LOW (ref 26.0–34.0)
MCHC: 32.6 g/dL (ref 30.0–36.0)
MCV: 79.1 fL (ref 78.0–100.0)
Monocytes Absolute: 0.5 10*3/uL (ref 0.1–1.0)
Monocytes Relative: 4 % (ref 3–12)
Neutro Abs: 8.5 10*3/uL — ABNORMAL HIGH (ref 1.7–7.7)
Neutrophils Relative %: 69 % (ref 43–77)
Platelets: 301 10*3/uL (ref 150–400)
RBC: 4.69 MIL/uL (ref 3.87–5.11)
RDW: 14.3 % (ref 11.5–15.5)
WBC: 12.2 10*3/uL — ABNORMAL HIGH (ref 4.0–10.5)

## 2012-09-04 LAB — URINALYSIS, ROUTINE W REFLEX MICROSCOPIC
Bilirubin Urine: NEGATIVE
Glucose, UA: NEGATIVE mg/dL
Hgb urine dipstick: NEGATIVE
Ketones, ur: NEGATIVE mg/dL
Nitrite: NEGATIVE
Protein, ur: NEGATIVE mg/dL
Specific Gravity, Urine: 1.02 (ref 1.005–1.030)
Urobilinogen, UA: 0.2 mg/dL (ref 0.0–1.0)
pH: 6 (ref 5.0–8.0)

## 2012-09-04 LAB — GLUCOSE, CAPILLARY: Glucose-Capillary: 94 mg/dL (ref 70–99)

## 2012-09-04 LAB — URINE MICROSCOPIC-ADD ON

## 2012-09-04 MED ORDER — DIPHENHYDRAMINE HCL 50 MG/ML IJ SOLN
25.0000 mg | Freq: Once | INTRAMUSCULAR | Status: AC
  Administered 2012-09-04: 25 mg via INTRAMUSCULAR
  Filled 2012-09-04: qty 1

## 2012-09-04 MED ORDER — HYDROMORPHONE HCL PF 1 MG/ML IJ SOLN
1.0000 mg | Freq: Once | INTRAMUSCULAR | Status: AC
  Administered 2012-09-04: 1 mg via INTRAVENOUS
  Filled 2012-09-04: qty 1

## 2012-09-04 MED ORDER — SODIUM CHLORIDE 0.9 % IV BOLUS (SEPSIS)
1000.0000 mL | Freq: Once | INTRAVENOUS | Status: AC
  Administered 2012-09-04: 1000 mL via INTRAVENOUS

## 2012-09-04 MED ORDER — PROMETHAZINE HCL 25 MG/ML IJ SOLN
12.5000 mg | Freq: Once | INTRAMUSCULAR | Status: AC
  Administered 2012-09-04: 12.5 mg via INTRAVENOUS
  Filled 2012-09-04: qty 1

## 2012-09-04 NOTE — ED Notes (Signed)
Pt resting quietly no complaints,  Family at bedside

## 2012-09-04 NOTE — ED Provider Notes (Signed)
History    CSN: [id number] Arrival date & time 09/03/12  2235  First MD Initiated Contact with Patient 09/03/12 2317     Chief Complaint  Patient presents with  . Dizziness  . Headache  . Pleurisy   (Consider location/radiation/quality/duration/timing/severity/associated sxs/prior Treatment) HPI Comments: Patient is a 32 y/o female with hx of bipolar d/o, migraine headaches, and chronic pain, as well as unconfirmed hx of pseudoseizures (per patient who states "I used to be on Keppra 2 years ago") who presents for headache x 3 days. Patient states headache was without thunderclap onset and is sharp in nature without aggravating or alleviating factors. Headaches have been associated with dizziness, intermittent tinnitus, and blurry vision. Patient endorsing having 2 seizure like episodes yesterday with symptoms lasting 15 seconds (per her daughters) with a post ictal phase of 1-5 seconds. Family denies tonic clonic type movements and tongue biting; however patient states episodes associated with nausea and NB/NB emesis x 1 yesterday post seizure. As an aside, patient also complaining of pleuritic type chest tightness with intermittent SOB. She denies fevers, difficulty speaking or swallowing, cough, abdominal pain, urinary symptoms, numbness/tingling, and extremity weakness.  The history is provided by the patient. No language interpreter was used.   Past Medical History  Diagnosis Date  . Breast infection 04/04/2010    hospitalized, given antibiotics, referred to surgeon  . Bipolar disorder   . Vaginitis 04/04/2010  . Anemia   . Migraine headache   . Hemorrhoids, internal   . Obesity   . Diabetes mellitus   . Colonic adenoma   . Helicobacter pylori (H. pylori)     s/p treatment  . Anxiety   . Complication of anesthesia   . PONV (postoperative nausea and vomiting)   . Diverticulitis   . Chronic pain   . Vitamin B12 deficiency    Past Surgical History  Procedure Laterality Date   . Cholecystectomy    . Tubal ligation    . Appendectomy  2008    lower abd pain  . S/p hysterectomy  08/2007    No Cancer Cells  . Breast reduction surgery  08/2008    from a 44 DDD to a 44 C  . Bilateral salpingoophorectomy  06/2009  . Tumor removal      from left shoulder  . Colonoscopy  01/04/10    tubular adenoma of cecum, normal TI, patchy fibrotic changes involving sigm, desc s/p bx neg. Next TCS 12/2016  . Esophagogastroduodenoscopy  01/04/10    normal esophagus s/p dilation, small hh, H pylori gastritis  . Esophagogastroduodenoscopy  2008    florid gastroesophageal reflux during exam but normal mucosa  . Abdominal hysterectomy    . Wisdom tooth extraction     Family History  Problem Relation Age of Onset  . Cancer Mother     cervical, deceased age 32  . Cancer Father     lung cancer, deceased age 42  . Anesthesia problems Neg Hx   . Hypotension Neg Hx   . Malignant hyperthermia Neg Hx   . Pseudochol deficiency Neg Hx   . COPD Sister   . CAD Sister   . Heart attack Other    History  Substance Use Topics  . Smoking status: Never Smoker   . Smokeless tobacco: Never Used  . Alcohol Use: No   OB History   Grav Para Term Preterm Abortions TAB SAB Ect Mult Living  Review of Systems  Constitutional: Negative for fever, chills and diaphoresis.  HENT: Positive for tinnitus (intermittent). Negative for trouble swallowing, neck pain and neck stiffness.   Eyes: Positive for visual disturbance (intermittent blurry vision).  Respiratory: Positive for chest tightness and shortness of breath (intermittent).   Cardiovascular: Negative for chest pain.  Gastrointestinal: Positive for nausea and vomiting (x 1 yesterday; NB/NB). Negative for abdominal pain and blood in stool.  Genitourinary: Negative for dysuria and hematuria.  Skin: Negative for color change, pallor and rash.  Neurological: Positive for dizziness, seizures (x2 yesterday) and headaches  (generalized). Negative for speech difficulty and weakness.  All other systems reviewed and are negative.    Allergies  Demerol; Prednisone; Tomato; Cephalosporins; Dust mite extract; Ketorolac tromethamine; Lipitor; Nalbuphine; Pineapple; Tramadol hcl; Vicodin; Zofran; and Aspirin  Home Medications   Current Outpatient Rx  Name  Route  Sig  Dispense  Refill  . ALPRAZolam (XANAX) 1 MG tablet   Oral   Take 1 mg by mouth 3 (three) times daily as needed for anxiety.         . busPIRone (BUSPAR) 15 MG tablet   Oral   Take 15 mg by mouth 2 (two) times daily.         . Cyanocobalamin (VITAMIN B-12 IJ)   Injection   Inject 1,000 mg as directed every 30 (thirty) days.          . eszopiclone (LUNESTA) 2 MG TABS   Oral   Take 2 mg by mouth at bedtime. Take immediately before bedtime         . lisinopril (PRINIVIL,ZESTRIL) 5 MG tablet   Oral   Take 5 mg by mouth every morning.          . metFORMIN (GLUCOPHAGE-XR) 500 MG 24 hr tablet   Oral   Take 500 mg by mouth daily with breakfast.         . sertraline (ZOLOFT) 50 MG tablet   Oral   Take 50 mg by mouth daily.          BP 140/89  Pulse 66  Temp(Src) 98 F (36.7 C) (Oral)  Resp 20  SpO2 96% Physical Exam  Nursing note and vitals reviewed. Constitutional: She is oriented to person, place, and time. She appears well-developed and well-nourished. No distress.  Morbidly obese female. Patient is well and nontoxic appearing and in no acute distress.  HENT:  Head: Normocephalic and atraumatic.  Mouth/Throat: Oropharynx is clear and moist. No oropharyngeal exudate.  Symmetric rise of the uvula with phonation  Eyes: Conjunctivae and EOM are normal. Pupils are equal, round, and reactive to light. No scleral icterus.  Neck: Normal range of motion. Neck supple.  Cardiovascular: Normal rate, regular rhythm, normal heart sounds and intact distal pulses.   Pulmonary/Chest: Effort normal and breath sounds normal. No  respiratory distress. She has no wheezes. She has no rales.  Abdominal: Soft. She exhibits no mass. There is no tenderness. There is no rebound and no guarding.  Soft, morbidly obese abdomen without tenderness to palpation. No peritoneal signs.  Musculoskeletal: Normal range of motion. She exhibits no edema.  Lymphadenopathy:    She has no cervical adenopathy.  Neurological: She is alert and oriented to person, place, and time. She has normal reflexes. No cranial nerve deficit. She exhibits normal muscle tone.  Patient speaks in full goal oriented sentences. Cranial nerves III through XII grossly intact. Patient exhibits equal grip strength bilaterally with 5 out of 5  strength against resistance in her upper and lower extremities. DTRs normal and symmetric. No sensory or motor deficits appreciated and patient was extremities without ataxia.  Skin: Skin is warm and dry. No rash noted. She is not diaphoretic. No erythema. No pallor.  Psychiatric: She has a normal mood and affect. Her behavior is normal.    ED Course  Procedures (including critical care time) Labs Reviewed  CBC WITH DIFFERENTIAL - Abnormal; Notable for the following:    WBC 12.2 (*)    MCH 25.8 (*)    Neutro Abs 8.5 (*)    All other components within normal limits  URINALYSIS, ROUTINE W REFLEX MICROSCOPIC - Abnormal; Notable for the following:    Leukocytes, UA SMALL (*)    All other components within normal limits  URINE RAPID DRUG SCREEN (HOSP PERFORMED) - Abnormal; Notable for the following:    Benzodiazepines POSITIVE (*)    All other components within normal limits  BASIC METABOLIC PANEL  URINE MICROSCOPIC-ADD ON  GLUCOSE, CAPILLARY    Date: 09/04/2012  Rate: 71  Rhythm: normal sinus rhythm  QRS Axis: normal  Intervals: normal  ST/T Wave abnormalities: normal  Conduction Disutrbances:none  Narrative Interpretation: NSR without STEMI or ischemic changes  Old EKG Reviewed: unchanged from 07/29/12 I have  personally reviewed and interpreted this EKG  Ct Head Wo Contrast  09/04/2012   *RADIOLOGY REPORT*  Clinical Data: Dizziness, headache.  CT HEAD WITHOUT CONTRAST  Technique:  Contiguous axial images were obtained from the base of the skull through the vertex without contrast.  Comparison: 05/29/2012  Findings: No acute intracranial abnormality.  Specifically, no hemorrhage, hydrocephalus, mass lesion, acute infarction, or significant intracranial injury.  No acute calvarial abnormality. Visualized paranasal sinuses and mastoids clear.  Orbital soft tissues unremarkable.  IMPRESSION: Normal study.   Original Report Authenticated By: Charlett Nose, M.D.    1. Headache     MDM  Headache with ?seizure yesterday. Physical exam as above without significant findings or focal neurologic deficits. Patient neurovascularly intact. Labs with mild leukocytosis, otherwise c/w prior work ups. UA without infection or hematuria. CT head obtained in light of headache in presence of questionable seizure activity which shows no acute findings. Patient hemodynamically stable without tachycardia, tachypnea, or hypoxia. Headache improved after IVF, benadryl, and dilaudid. Patient appropriate for d/c with PCP and neurology follow up for further evaluation of symptoms. Indications for ED return discussed with patient who verbalizes comfort and understanding with plan. Patient work up and management also discussed with Dr. Karma Ganja prior to d/c who is in agreement.   Filed Vitals:   09/03/12 2249 09/04/12 0347  BP: 140/89 121/68  Pulse: 66 68  Temp: 98 F (36.7 C)   TempSrc: Oral   Resp: 20 18  SpO2: 96% 96%        Antony Madura, PA-C 09/09/12 [phone]

## 2012-09-06 DIAGNOSIS — R569 Unspecified convulsions: Secondary | ICD-10-CM

## 2012-09-09 NOTE — ED Provider Notes (Signed)
Medical screening examination/treatment/procedure(s) were performed by non-physician practitioner and as supervising physician I was immediately available for consultation/collaboration.  Ethelda Chick, MD 09/09/12 [phone]

## 2012-09-17 ENCOUNTER — Emergency Department (HOSPITAL_COMMUNITY)
Admission: EM | Admit: 2012-09-17 | Discharge: 2012-09-18 | Disposition: A | Discharge status: Home / Self Care | Payer: PRIVATE HEALTH INSURANCE | Attending: Emergency Medicine | Admitting: Emergency Medicine

## 2012-09-17 ENCOUNTER — Encounter (HOSPITAL_COMMUNITY): Payer: Self-pay

## 2012-09-17 DIAGNOSIS — F411 Generalized anxiety disorder: Secondary | ICD-10-CM | POA: Insufficient documentation

## 2012-09-17 DIAGNOSIS — G43909 Migraine, unspecified, not intractable, without status migrainosus: Secondary | ICD-10-CM | POA: Insufficient documentation

## 2012-09-17 DIAGNOSIS — Z860100 Personal history of colon polyps, unspecified: Secondary | ICD-10-CM | POA: Insufficient documentation

## 2012-09-17 DIAGNOSIS — IMO0001 Reserved for inherently not codable concepts without codable children: Secondary | ICD-10-CM | POA: Insufficient documentation

## 2012-09-17 DIAGNOSIS — Z8719 Personal history of other diseases of the digestive system: Secondary | ICD-10-CM | POA: Insufficient documentation

## 2012-09-17 DIAGNOSIS — E669 Obesity, unspecified: Secondary | ICD-10-CM | POA: Insufficient documentation

## 2012-09-17 DIAGNOSIS — E538 Deficiency of other specified B group vitamins: Secondary | ICD-10-CM | POA: Insufficient documentation

## 2012-09-17 DIAGNOSIS — G8929 Other chronic pain: Secondary | ICD-10-CM | POA: Insufficient documentation

## 2012-09-17 DIAGNOSIS — Z8742 Personal history of other diseases of the female genital tract: Secondary | ICD-10-CM | POA: Insufficient documentation

## 2012-09-17 DIAGNOSIS — R209 Unspecified disturbances of skin sensation: Secondary | ICD-10-CM | POA: Insufficient documentation

## 2012-09-17 DIAGNOSIS — F319 Bipolar disorder, unspecified: Secondary | ICD-10-CM | POA: Insufficient documentation

## 2012-09-17 DIAGNOSIS — G40909 Epilepsy, unspecified, not intractable, without status epilepticus: Secondary | ICD-10-CM | POA: Insufficient documentation

## 2012-09-17 DIAGNOSIS — R079 Chest pain, unspecified: Secondary | ICD-10-CM | POA: Insufficient documentation

## 2012-09-17 DIAGNOSIS — M542 Cervicalgia: Secondary | ICD-10-CM | POA: Insufficient documentation

## 2012-09-17 DIAGNOSIS — Z862 Personal history of diseases of the blood and blood-forming organs and certain disorders involving the immune mechanism: Secondary | ICD-10-CM | POA: Insufficient documentation

## 2012-09-17 DIAGNOSIS — Z79899 Other long term (current) drug therapy: Secondary | ICD-10-CM | POA: Insufficient documentation

## 2012-09-17 DIAGNOSIS — Z8679 Personal history of other diseases of the circulatory system: Secondary | ICD-10-CM | POA: Insufficient documentation

## 2012-09-17 DIAGNOSIS — R51 Headache: Secondary | ICD-10-CM | POA: Insufficient documentation

## 2012-09-17 DIAGNOSIS — Z8619 Personal history of other infectious and parasitic diseases: Secondary | ICD-10-CM | POA: Insufficient documentation

## 2012-09-17 DIAGNOSIS — R519 Headache, unspecified: Secondary | ICD-10-CM

## 2012-09-17 DIAGNOSIS — E119 Type 2 diabetes mellitus without complications: Secondary | ICD-10-CM | POA: Insufficient documentation

## 2012-09-17 LAB — CBC WITH DIFFERENTIAL/PLATELET
Basophils Absolute: 0 10*3/uL (ref 0.0–0.1)
Basophils Relative: 0 % (ref 0–1)
Eosinophils Absolute: 0.3 10*3/uL (ref 0.0–0.7)
Eosinophils Relative: 2 % (ref 0–5)
HCT: 38.9 % (ref 36.0–46.0)
Hemoglobin: 12.4 g/dL (ref 12.0–15.0)
Lymphocytes Relative: 25 % (ref 12–46)
Lymphs Abs: 3.4 10*3/uL (ref 0.7–4.0)
MCH: 25.4 pg — ABNORMAL LOW (ref 26.0–34.0)
MCHC: 31.9 g/dL (ref 30.0–36.0)
MCV: 79.7 fL (ref 78.0–100.0)
Monocytes Absolute: 0.5 10*3/uL (ref 0.1–1.0)
Monocytes Relative: 4 % (ref 3–12)
Neutro Abs: 9.6 10*3/uL — ABNORMAL HIGH (ref 1.7–7.7)
Neutrophils Relative %: 69 % (ref 43–77)
Platelets: 394 10*3/uL (ref 150–400)
RBC: 4.88 MIL/uL (ref 3.87–5.11)
RDW: 14.9 % (ref 11.5–15.5)
WBC: 13.8 10*3/uL — ABNORMAL HIGH (ref 4.0–10.5)

## 2012-09-17 LAB — GLUCOSE, CAPILLARY: Glucose-Capillary: 107 mg/dL — ABNORMAL HIGH (ref 70–99)

## 2012-09-17 NOTE — ED Notes (Signed)
Friends called out and stated that patient was having a seizure, pt was helped to the floor and was responsive, pt helped to stretcher and placed in room

## 2012-09-17 NOTE — ED Notes (Signed)
Pt complains of a headache, mid chest pain, neck pain and numbness since Monday

## 2012-09-17 NOTE — ED Notes (Signed)
Pt from triage area-pt is awake and alert-appropriately conversive-family at bedside/placed on Monitor-MP SR with no ectopy-rate 80's-PIV started-O2 sat 99% on RA-pt has multiple generalized complaints of neck pain/chest pain/c/o generalized headache-speaking full sentences-pt also c/o feeling dizzy and nauseated-states "I just don't feel good"

## 2012-09-18 LAB — COMPREHENSIVE METABOLIC PANEL
ALT: 21 U/L (ref 0–35)
AST: 21 U/L (ref 0–37)
Albumin: 3.3 g/dL — ABNORMAL LOW (ref 3.5–5.2)
Alkaline Phosphatase: 92 U/L (ref 39–117)
BUN: 9 mg/dL (ref 6–23)
CO2: 22 mEq/L (ref 19–32)
Calcium: 9.3 mg/dL (ref 8.4–10.5)
Chloride: 105 mEq/L (ref 96–112)
Creatinine, Ser: 0.81 mg/dL (ref 0.50–1.10)
GFR calc Af Amer: >90 mL/min (ref >90)
GFR calc non Af Amer: >90 mL/min (ref >90)
Glucose, Bld: 115 mg/dL — ABNORMAL HIGH (ref 70–99)
Potassium: 3.6 mEq/L (ref 3.5–5.1)
Sodium: 138 mEq/L (ref 135–145)
Total Bilirubin: 0.2 mg/dL — ABNORMAL LOW (ref 0.3–1.2)
Total Protein: 7.7 g/dL (ref 6.0–8.3)

## 2012-09-18 MED ORDER — DIPHENHYDRAMINE HCL 50 MG/ML IJ SOLN
25.0000 mg | Freq: Once | INTRAMUSCULAR | Status: AC
  Administered 2012-09-18: 25 mg via INTRAVENOUS
  Filled 2012-09-18: qty 1

## 2012-09-18 MED ORDER — SODIUM CHLORIDE 0.9 % IV BOLUS (SEPSIS)
500.0000 mL | Freq: Once | INTRAVENOUS | Status: AC
  Administered 2012-09-18: 500 mL via INTRAVENOUS

## 2012-09-18 MED ORDER — LORAZEPAM 2 MG/ML IJ SOLN
0.5000 mg | Freq: Once | INTRAMUSCULAR | Status: AC
  Administered 2012-09-18: 0.5 mg via INTRAVENOUS
  Filled 2012-09-18: qty 1

## 2012-09-18 MED ORDER — METOCLOPRAMIDE HCL 5 MG/ML IJ SOLN
10.0000 mg | Freq: Once | INTRAMUSCULAR | Status: AC
  Administered 2012-09-18: 10 mg via INTRAVENOUS
  Filled 2012-09-18: qty 2

## 2012-09-18 NOTE — ED Notes (Signed)
MD at bedside. 

## 2012-09-18 NOTE — ED Provider Notes (Signed)
History    CSN: [id number] Arrival date & time 09/17/12  2153  First MD Initiated Contact with Patient 09/17/12 2357     Chief Complaint  Patient presents with  . Headache  . Chest Pain  . Numbness   (Consider location/radiation/quality/duration/timing/severity/associated sxs/prior Treatment) HPI 32 yo female presents to the ER with complaint of headache ongoing for the last several weeks, change in her seizure pattern.  Pt has referral to neurology in 2 weeks.  She reports past history of pseudoseizures for which she was on keppra several years ago.  Those seizures were characterized per her friends and twitching and memory loss.  Over the last several weeks she has had a new kind of seizure where "my whole body locks up".  This lasts seconds, and then resolves.  She is sometimes conscious during these episodes, and sometimes "goes to sleep some"  After the seizures she reports worsening headaches, chest soreness, neck pain, muscle pain.  She has seen her PA who started her on topamax and percocet for seizures/headaches.  She reports she has not been taking the percocet as often as she would like as she gets too sleepy to care for her kids.  HA has been constant since Monday. No fevers, no weakness, no focal sxs.  Pt noted to have seizure like activity in waiting room-no LOC, no incontinence, interactive with staff during and immediately after event. Past Medical History  Diagnosis Date  . Breast infection 04/04/2010    hospitalized, given antibiotics, referred to surgeon  . Bipolar disorder   . Vaginitis 04/04/2010  . Anemia   . Migraine headache   . Hemorrhoids, internal   . Obesity   . Diabetes mellitus   . Colonic adenoma   . Helicobacter pylori (H. pylori)     s/p treatment  . Anxiety   . Complication of anesthesia   . PONV (postoperative nausea and vomiting)   . Diverticulitis   . Chronic pain   . Vitamin B12 deficiency    Past Surgical History  Procedure Laterality  Date  . Cholecystectomy    . Tubal ligation    . Appendectomy  2008    lower abd pain  . S/p hysterectomy  08/2007    No Cancer Cells  . Breast reduction surgery  08/2008    from a 44 DDD to a 44 C  . Bilateral salpingoophorectomy  06/2009  . Tumor removal      from left shoulder  . Colonoscopy  01/04/10    tubular adenoma of cecum, normal TI, patchy fibrotic changes involving sigm, desc s/p bx neg. Next TCS 12/2016  . Esophagogastroduodenoscopy  01/04/10    normal esophagus s/p dilation, small hh, H pylori gastritis  . Esophagogastroduodenoscopy  2008    florid gastroesophageal reflux during exam but normal mucosa  . Abdominal hysterectomy    . Wisdom tooth extraction     Family History  Problem Relation Age of Onset  . Cancer Mother     cervical, deceased age 49  . Cancer Father     lung cancer, deceased age 86  . Anesthesia problems Neg Hx   . Hypotension Neg Hx   . Malignant hyperthermia Neg Hx   . Pseudochol deficiency Neg Hx   . COPD Sister   . CAD Sister   . Heart attack Other    History  Substance Use Topics  . Smoking status: Never Smoker   . Smokeless tobacco: Never Used  . Alcohol Use: No  OB History   Grav Para Term Preterm Abortions TAB SAB Ect Mult Living                 Review of Systems  All other systems reviewed and are negative.    Allergies  Demerol; Prednisone; Tomato; Cephalosporins; Dust mite extract; Ketorolac tromethamine; Lipitor; Nalbuphine; Pineapple; Tramadol hcl; Vicodin; Zofran; and Aspirin  Home Medications   Current Outpatient Rx  Name  Route  Sig  Dispense  Refill  . ALPRAZolam (XANAX) 1 MG tablet   Oral   Take 1 mg by mouth 3 (three) times daily as needed for anxiety.         . busPIRone (BUSPAR) 15 MG tablet   Oral   Take 15 mg by mouth 2 (two) times daily.         . ciprofloxacin (CIPRO) 500 MG tablet   Oral   Take 500 mg by mouth 2 (two) times daily.         . Cyanocobalamin (VITAMIN B-12 IJ)    Injection   Inject 1,000 mg as directed every 30 (thirty) days.          . eszopiclone (LUNESTA) 2 MG TABS   Oral   Take 2 mg by mouth at bedtime. Take immediately before bedtime         . lisinopril (PRINIVIL,ZESTRIL) 5 MG tablet   Oral   Take 5 mg by mouth every morning.          Marland Kitchen oxyCODONE-acetaminophen (PERCOCET) 7.5-325 MG per tablet   Oral   Take 1 tablet by mouth every 6 (six) hours as needed for pain.         Marland Kitchen sertraline (ZOLOFT) 50 MG tablet   Oral   Take 50 mg by mouth daily.         Marland Kitchen topiramate (TOPAMAX) 25 MG tablet   Oral   Take 25 mg by mouth 2 (two) times daily.          BP 102/61  Pulse 83  Temp(Src) 99 F (37.2 C) (Oral)  Resp 18  Ht 5\' 6"  (1.676 m)  Wt 249 lb (112.946 kg)  BMI 40.21 kg/m2  SpO2 97% Physical Exam  Nursing note and vitals reviewed. Constitutional: She is oriented to person, place, and time. She appears well-developed and well-nourished.  Obese female NAD  HENT:  Head: Normocephalic and atraumatic.  Right Ear: External ear normal.  Left Ear: External ear normal.  Nose: Nose normal.  Mouth/Throat: Oropharynx is clear and moist.  Eyes: Conjunctivae and EOM are normal. Pupils are equal, round, and reactive to light.  Neck: Normal range of motion. Neck supple. No JVD present. No tracheal deviation present. No thyromegaly present.  Diffuse posterior soft tissue tenderness no step off or crepitus  Cardiovascular: Normal rate, regular rhythm, normal heart sounds and intact distal pulses.  Exam reveals no gallop and no friction rub.   No murmur heard. Pulmonary/Chest: Effort normal and breath sounds normal. No stridor. No respiratory distress. She has no wheezes. She has no rales. She exhibits no tenderness.  Abdominal: Soft. Bowel sounds are normal. She exhibits no distension and no mass. There is no tenderness. There is no rebound and no guarding.  Musculoskeletal: Normal range of motion. She exhibits no edema and no  tenderness.  Lymphadenopathy:    She has no cervical adenopathy.  Neurological: She is alert and oriented to person, place, and time. She has normal reflexes. No cranial nerve deficit. She exhibits  normal muscle tone. Coordination normal.  Skin: Skin is warm and dry. No rash noted. No erythema. No pallor.  Psychiatric: She has a normal mood and affect. Her behavior is normal. Judgment and thought content normal.    ED Course  Procedures (including critical care time) Labs Reviewed  GLUCOSE, CAPILLARY - Abnormal; Notable for the following:    Glucose-Capillary 107 (*)    All other components within normal limits  CBC WITH DIFFERENTIAL - Abnormal; Notable for the following:    WBC 13.8 (*)    MCH 25.4 (*)    Neutro Abs 9.6 (*)    All other components within normal limits  COMPREHENSIVE METABOLIC PANEL - Abnormal; Notable for the following:    Glucose, Bld 115 (*)    Albumin 3.3 (*)    Total Bilirubin 0.2 (*)    All other components within normal limits   No results found. 1. Headache   2. Seizure disorder     MDM  32 yo female with headache and seizure vs pseudoseizures.  HA improved after cocktail.  Pt has appropriate followup.  Given precautions for driving etc with seizures.  Olivia Mackie, MD 09/18/12 1750

## 2012-09-28 ENCOUNTER — Encounter: Payer: Self-pay | Admitting: Neurology

## 2012-09-29 ENCOUNTER — Encounter: Payer: Self-pay | Admitting: Neurology

## 2012-09-29 ENCOUNTER — Ambulatory Visit (INDEPENDENT_AMBULATORY_CARE_PROVIDER_SITE_OTHER): Payer: PRIVATE HEALTH INSURANCE | Admitting: Neurology

## 2012-09-29 VITALS — BP 119/84 | HR 73 | Ht 65.0 in | Wt 256.0 lb

## 2012-09-29 DIAGNOSIS — G43909 Migraine, unspecified, not intractable, without status migrainosus: Secondary | ICD-10-CM

## 2012-09-29 DIAGNOSIS — K5900 Constipation, unspecified: Secondary | ICD-10-CM

## 2012-09-29 DIAGNOSIS — I10 Essential (primary) hypertension: Secondary | ICD-10-CM

## 2012-09-29 DIAGNOSIS — E669 Obesity, unspecified: Secondary | ICD-10-CM

## 2012-09-29 DIAGNOSIS — R569 Unspecified convulsions: Secondary | ICD-10-CM | POA: Insufficient documentation

## 2012-09-29 MED ORDER — LAMOTRIGINE ER 200 MG PO TB24: 200.0000 mg | ORAL_TABLET | Freq: Every day | ORAL | Status: DC

## 2012-09-29 MED ORDER — LAMOTRIGINE ER 25 MG PO TB24: 25.0000 mg | ORAL_TABLET | Freq: Every day | ORAL | Status: DC

## 2012-09-29 NOTE — Progress Notes (Signed)
[facility]  PATIENT: Brenda Richardson DOB: 06-14-1980  HISTORICAL  Brenda Richardson is a 32 years old right-handed African American female, referred by her primary care physician Dr. Greggory Stallion Osei-Bonsu for evaluation of seizure  She had a past medical history of hypertension, depression anxiety, was diagnosed with pseudoseizure in 2012, had excessive stress during that time, was treated temporarily with keppra, keppra later was tapered off, when she quit having pseudoseizure.  She began to have seizure again since July 2014, her seizures described as staring into space, body stiffness, jerking movement, lasting one to 2 minutes, she has no recollection of the event, there was occasionally tongue biting, urinary incontinence,  CT of the brain was reported normal, she has seizure about 2-3 times each week, she was started on topiramate 25 mg 2 tablets twice a day there was no significant improvement in her seizure frequency, there is no significant side effect.  She denies trouble walking, no memory loss, no visual change,  Laboratory evaluation showed elevated WBC 12.7.laboratory showed negative UDS normal BMP,    REVIEW OF SYSTEMS: Full 14 system review of systems performed and notable only for SEIZURE  ALLERGIES: Allergies  Allergen Reactions  . Demerol (Meperidine) Anaphylaxis and Hives  . Prednisone Anaphylaxis and Other (See Comments)  . Tomato Anaphylaxis, Shortness Of Breath, Itching and Swelling  . Cephalosporins Other (See Comments)    Jitters, Shaking, Cold Sweats.   . Dust Mite Extract   . Ketorolac Tromethamine Itching  . Lipitor (Atorvastatin) Other (See Comments)    Yellow Spots   . Nalbuphine Nausea And Vomiting  . Pineapple Swelling    Tongue   . Tramadol Hcl Hives, Itching and Nausea Only  . Vicodin (Hydrocodone-Acetaminophen) Nausea And Vomiting  . Zofran Itching    To be given Benadryl prior upon administration of this medication  . Aspirin Other  (See Comments)    G.I. Upset.     HOME MEDICATIONS: Outpatient Prescriptions Prior to Visit  Medication Sig Dispense Refill  . ALPRAZolam (XANAX) 1 MG tablet Take 1 mg by mouth 3 (three) times daily as needed for anxiety.      . busPIRone (BUSPAR) 15 MG tablet Take 15 mg by mouth 2 (two) times daily.      . cholecalciferol (VITAMIN D) 1000 UNITS tablet Take 1,000 Units by mouth daily.      . ciprofloxacin (CIPRO) 500 MG tablet Take 500 mg by mouth 2 (two) times daily.      . Cyanocobalamin (VITAMIN B-12 IJ) Inject 1,000 mg as directed every 30 (thirty) days.       . eszopiclone (LUNESTA) 2 MG TABS Take 2 mg by mouth at bedtime. Take immediately before bedtime      . Hydrocodone-Acetaminophen (NORCO PO) Take by mouth as directed.      Marland Kitchen lisinopril (PRINIVIL,ZESTRIL) 5 MG tablet Take 5 mg by mouth every morning.       Marland Kitchen oxyCODONE-acetaminophen (PERCOCET) 7.5-325 MG per tablet Take 1 tablet by mouth every 6 (six) hours as needed for pain.      Marland Kitchen sertraline (ZOLOFT) 50 MG tablet Take 50 mg by mouth daily.      Marland Kitchen topiramate (TOPAMAX) 25 MG tablet Take 50 mg by mouth 2 (two) times daily.       . metFORMIN (GLUCOPHAGE) 500 MG tablet Take 500 mg by mouth 2 (two) times daily with a meal.      . zolpidem (AMBIEN) 10 MG tablet Take 10 mg by mouth  at bedtime as needed for sleep.       No facility-administered medications prior to visit.    PAST MEDICAL HISTORY: Past Medical History  Diagnosis Date  . Breast infection 04/04/2010    hospitalized, given antibiotics, referred to surgeon  . Bipolar disorder   . Vaginitis 04/04/2010  . Anemia   . Migraine headache   . Hemorrhoids, internal   . Obesity   . Diabetes mellitus   . Colonic adenoma   . Helicobacter pylori (H. pylori)     s/p treatment  . Anxiety   . Complication of anesthesia   . PONV (postoperative nausea and vomiting)   . Diverticulitis   . Chronic pain   . Vitamin B12 deficiency     PAST SURGICAL HISTORY: Past Surgical  History  Procedure Laterality Date  . Cholecystectomy    . Tubal ligation    . Appendectomy  2008    lower abd pain  . S/p hysterectomy  08/2007    No Cancer Cells  . Breast reduction surgery  08/2008    from a 44 DDD to a 44 C  . Bilateral salpingoophorectomy  06/2009  . Tumor removal      from left shoulder  . Colonoscopy  01/04/10    tubular adenoma of cecum, normal TI, patchy fibrotic changes involving sigm, desc s/p bx neg. Next TCS 12/2016  . Esophagogastroduodenoscopy  01/04/10    normal esophagus s/p dilation, small hh, H pylori gastritis  . Esophagogastroduodenoscopy  2008    florid gastroesophageal reflux during exam but normal mucosa  . Abdominal hysterectomy    . Wisdom tooth extraction      FAMILY HISTORY: Family History  Problem Relation Age of Onset  . Cancer Mother     cervical, deceased age 44  . Cancer Father     lung cancer, deceased age 21  . Anesthesia problems Neg Hx   . Hypotension Neg Hx   . Malignant hyperthermia Neg Hx   . Pseudochol deficiency Neg Hx   . COPD Sister   . CAD Sister   . Heart attack Other     SOCIAL HISTORY:  History   Social History  . Marital Status: Married    Spouse Name: Ivar Drape    Number of Children: 3  . Years of Education: 7th   Occupational History  .      Disabled   Social History Main Topics  . Smoking status: Never Smoker   . Smokeless tobacco: Never Used  . Alcohol Use: No  . Drug Use: No  . Sexually Active: Yes    Birth Control/ Protection: Surgical   Other Topics Concern  . Not on file   Social History Narrative   Patient lives at home with her husband Ivar Drape). Patient has 7th grade education. Patient is disabled.   Right handed.   Caffeine- Very rare.     PHYSICAL EXAM   09/29/12 1055  BP: 119/84  Pulse: 73  Height: 5\' 5"  (1.651 m)  Weight: 256 lb (116.121 kg)     Body mass index is 42.6 kg/(m^2).   Generalized: In no acute distress  Neck: Supple, no carotid bruits   Cardiac:  Regular rate rhythm  Pulmonary: Clear to auscultation bilaterally  Musculoskeletal: No deformity  Neurological examination  Mentation: Alert oriented to time, place, history taking, and causual conversation  Cranial nerve II-XII: Pupils were equal round reactive to light extraocular movements were full, visual field were full on confrontational test. facial sensation  and strength were normal. hearing was intact to finger rubbing bilaterally. Uvula tongue midline.  head turning and shoulder shrug and were normal and symmetric.Tongue protrusion into cheek strength was normal.  Motor: normal tone, bulk and strength.  Sensory: Intact to fine touch, pinprick, preserved vibratory sensation, and proprioception at toes.  Coordination: Normal finger to nose, heel-to-shin bilaterally there was no truncal ataxia  Gait: Rising up from seated position without assistance, normal stance, without trunk ataxia, moderate stride, good arm swing, smooth turning, able to perform tiptoe, and heel walking without difficulty.   Romberg signs: Negative  Deep tendon reflexes: Brachioradialis 2/2, biceps 2/2, triceps 2/2, patellar 2/2, Achilles 2/2, plantar responses were flexor bilaterally.   DIAGNOSTIC DATA (LABS, IMAGING, TESTING) - I reviewed patient records, labs, notes, testing and imaging myself where available.  Lab Results  Component Value Date   WBC 13.8* 09/17/2012   HGB 12.4 09/17/2012   HCT 38.9 09/17/2012   MCV 79.7 09/17/2012   PLT 394 09/17/2012      Component Value Date/Time   NA 138 09/18/2012 0000   K 3.6 09/18/2012 0000   CL 105 09/18/2012 0000   CO2 22 09/18/2012 0000   GLUCOSE 115* 09/18/2012 0000   BUN 9 09/18/2012 0000   CREATININE 0.81 09/18/2012 0000   CALCIUM 9.3 09/18/2012 0000   PROT 7.7 09/18/2012 0000   ALBUMIN 3.3* 09/18/2012 0000   AST 21 09/18/2012 0000   ALT 21 09/18/2012 0000   ALKPHOS 92 09/18/2012 0000   BILITOT 0.2* 09/18/2012 0000   GFRNONAA >90 09/18/2012 0000   GFRAA  >90 09/18/2012 0000   Lab Results  Component Value Date   CHOL  Value: 170        ATP III CLASSIFICATION:  <200     mg/dL   Desirable  161-096  mg/dL   Borderline High  >=045    mg/dL   High        4/0/9811   HDL 49 08/16/2009   LDLCALC  Value: 103        Total Cholesterol/HDL:CHD Risk Coronary Heart Disease Risk Table                     Men   Women  1/2 Average Risk   3.4   3.3  Average Risk       5.0   4.4  2 X Average Risk   9.6   7.1  3 X Average Risk  23.4   11.0        Use the calculated Patient Ratio above and the CHD Risk Table to determine the patient's CHD Risk.        ATP III CLASSIFICATION (LDL):  <100     mg/dL   Optimal  914-782  mg/dL   Near or Above                    Optimal  130-159  mg/dL   Borderline  956-213  mg/dL   High  >086     mg/dL   Very High* 07/16/8467   TRIG 88 08/16/2009   CHOLHDL 3.5 08/16/2009   Lab Results  Component Value Date   HGBA1C  Value: 7.3 (NOTE)  According to the ADA Clinical Practice Recommendations for 2011, when HbA1c is used as a screening test:   >=6.5%   Diagnostic of Diabetes Mellitus           (if abnormal result  is confirmed)  5.7-6.4%   Increased risk of developing Diabetes Mellitus  References:Diagnosis and Classification of Diabetes Mellitus,Diabetes Care,2011,34(Suppl 1):S62-S69 and Standards of Medical Care in         Diabetes - 2011,Diabetes Care,2011,34  (Suppl 1):S11-S61.* 04/01/2010   Lab Results  Component Value Date   VITAMINB12 340 12/19/2010   Lab Results  Component Value Date   TSH 1.472  08/15/2009    ASSESSMENT AND PLAN  32 years old [place] American female, with history of pseudoseizure, now presenting with seizure like activity again, normal neurological examination, possibility including generalized seizure vs. Pseudoseizure,  1. Complete evaluation with MRI of the brain without contrast, EEG 2.  Lamictal xr 200 mg every day 3.  return to clinic in  3 months         Levert Feinstein, M.D. Ph.D.  [facility] [address] [place], [place] [address] [phone]

## 2012-09-30 ENCOUNTER — Telehealth: Payer: Self-pay | Admitting: Neurology

## 2012-09-30 NOTE — Telephone Encounter (Signed)
Patient called stating her insurance isn't covering Lamictal and she wants to know if there's another medication that can be prescribed.

## 2012-10-01 ENCOUNTER — Other Ambulatory Visit: Payer: Self-pay | Admitting: Radiology

## 2012-10-01 ENCOUNTER — Telehealth: Payer: Self-pay

## 2012-10-01 ENCOUNTER — Ambulatory Visit (INDEPENDENT_AMBULATORY_CARE_PROVIDER_SITE_OTHER): Payer: PRIVATE HEALTH INSURANCE

## 2012-10-01 DIAGNOSIS — K5900 Constipation, unspecified: Secondary | ICD-10-CM

## 2012-10-01 DIAGNOSIS — G43909 Migraine, unspecified, not intractable, without status migrainosus: Secondary | ICD-10-CM

## 2012-10-01 DIAGNOSIS — E669 Obesity, unspecified: Secondary | ICD-10-CM

## 2012-10-01 DIAGNOSIS — R569 Unspecified convulsions: Secondary | ICD-10-CM

## 2012-10-01 DIAGNOSIS — I10 Essential (primary) hypertension: Secondary | ICD-10-CM

## 2012-10-01 MED ORDER — LAMOTRIGINE 200 MG PO TABS: 200.0000 mg | ORAL_TABLET | Freq: Every day | ORAL | Status: DC

## 2012-10-01 MED ORDER — LAMOTRIGINE 25 MG PO TABS: 25.0000 mg | ORAL_TABLET | ORAL | Status: DC

## 2012-10-01 NOTE — Telephone Encounter (Signed)
[facility] has faxed [place] saying although the patients insurance will not cover Lamictal XR, they will cover Lamictal regular release.  Okay to change Rx?  Please advise.  Thank you.

## 2012-10-01 NOTE — Telephone Encounter (Signed)
Rx has been updated and sent to the pharmacy.  I called the patient.  Explained med change.  She verbalized understanding.

## 2012-10-01 NOTE — Telephone Encounter (Signed)
Duplicate Encounter

## 2012-10-01 NOTE — Procedures (Signed)
HISTORY: 32 yo female with history of pseudoseizure, now presenting with seizure like activity again, normal neurological examination, generalized seizure vs. Pseudoseizure   TECHNIQUE:  16 channel EEG was performed based on standard 10-16 international system. One channel was dedicated to EKG, which has demonstrates normal sinus rhythm of 72 beats per minutes.  Upon awakening, the posterior background activity was well-developed, in alpha range, 8 hz,  with amplitude of 40 microvoltage, reactive to eye opening and closure.  There was no evidence of epilepsy for discharge.  Photic stimulation was performed, which induced a symmetric photic driving.  Hyperventilation was performed, there was no abnormality elicit.  No sleep was achieved.  CONCLUSION: This is a  Normal awake EEG.  There is no electrodiagnostic evidence of epileptiform discharge

## 2012-10-01 NOTE — Telephone Encounter (Signed)
Yes, it is Ok to switch.

## 2012-10-01 NOTE — Telephone Encounter (Deleted)
[facility] sent [place] a fax saying the patients insurance will not cover Lamictal XR.  They require the use of regular release Lamictal instead.  Ok to change?  Please advise.  Thank you.

## 2012-10-07 ENCOUNTER — Telehealth: Payer: Self-pay | Admitting: Neurology

## 2012-10-07 NOTE — Telephone Encounter (Signed)
Pt needs Dr. Terrace Arabia or her nurse to call her back about some pain medicine for headaches. Thanks

## 2012-10-08 NOTE — Telephone Encounter (Signed)
I have left a message,   Lupita Leash, please call her again

## 2012-10-13 DIAGNOSIS — R51 Headache: Secondary | ICD-10-CM

## 2012-10-13 NOTE — Telephone Encounter (Signed)
Left message for patient that we are seeing her for seizures.  Dr. Terrace Arabia had left her a message and I told her to follow what she advised.

## 2012-10-15 ENCOUNTER — Other Ambulatory Visit: Payer: Self-pay | Admitting: Diagnostic Neuroimaging

## 2012-10-15 DIAGNOSIS — R569 Unspecified convulsions: Secondary | ICD-10-CM

## 2012-10-15 DIAGNOSIS — G43909 Migraine, unspecified, not intractable, without status migrainosus: Secondary | ICD-10-CM

## 2012-10-15 DIAGNOSIS — I10 Essential (primary) hypertension: Secondary | ICD-10-CM

## 2012-10-15 DIAGNOSIS — K5900 Constipation, unspecified: Secondary | ICD-10-CM

## 2012-10-15 DIAGNOSIS — E669 Obesity, unspecified: Secondary | ICD-10-CM

## 2012-10-16 ENCOUNTER — Telehealth: Payer: Self-pay | Admitting: Neurology

## 2012-10-16 NOTE — Telephone Encounter (Signed)
Patient left message that she needed a refill on her Topamax.  When I spoke to her she had contacted her PCP who was the original Topamax prescriber.  He has refilled the rx for patient.

## 2012-10-29 ENCOUNTER — Telehealth: Payer: Self-pay | Admitting: *Deleted

## 2012-10-29 NOTE — Telephone Encounter (Signed)
done

## 2012-10-29 NOTE — Progress Notes (Signed)
Quick Note:  Left voicemail message w/ patient of normal MR Brain results ______

## 2012-12-30 ENCOUNTER — Ambulatory Visit: Payer: PRIVATE HEALTH INSURANCE | Admitting: Nurse Practitioner

## 2013-01-10 ENCOUNTER — Encounter (HOSPITAL_COMMUNITY): Payer: Self-pay | Admitting: Emergency Medicine

## 2013-01-10 ENCOUNTER — Emergency Department (HOSPITAL_COMMUNITY): Payer: PRIVATE HEALTH INSURANCE

## 2013-01-10 ENCOUNTER — Emergency Department (HOSPITAL_COMMUNITY)
Admission: EM | Admit: 2013-01-10 | Discharge: 2013-01-10 | Disposition: A | Discharge status: Home / Self Care | Payer: PRIVATE HEALTH INSURANCE | Attending: Emergency Medicine | Admitting: Emergency Medicine

## 2013-01-10 DIAGNOSIS — G40909 Epilepsy, unspecified, not intractable, without status epilepticus: Secondary | ICD-10-CM | POA: Insufficient documentation

## 2013-01-10 DIAGNOSIS — G43909 Migraine, unspecified, not intractable, without status migrainosus: Secondary | ICD-10-CM | POA: Insufficient documentation

## 2013-01-10 DIAGNOSIS — E669 Obesity, unspecified: Secondary | ICD-10-CM | POA: Insufficient documentation

## 2013-01-10 DIAGNOSIS — Z860100 Personal history of colon polyps, unspecified: Secondary | ICD-10-CM | POA: Insufficient documentation

## 2013-01-10 DIAGNOSIS — M7989 Other specified soft tissue disorders: Secondary | ICD-10-CM | POA: Diagnosis not present

## 2013-01-10 DIAGNOSIS — Z79899 Other long term (current) drug therapy: Secondary | ICD-10-CM | POA: Insufficient documentation

## 2013-01-10 DIAGNOSIS — Z8619 Personal history of other infectious and parasitic diseases: Secondary | ICD-10-CM | POA: Diagnosis not present

## 2013-01-10 DIAGNOSIS — R071 Chest pain on breathing: Secondary | ICD-10-CM | POA: Diagnosis not present

## 2013-01-10 DIAGNOSIS — E119 Type 2 diabetes mellitus without complications: Secondary | ICD-10-CM | POA: Insufficient documentation

## 2013-01-10 DIAGNOSIS — Z8742 Personal history of other diseases of the female genital tract: Secondary | ICD-10-CM | POA: Diagnosis not present

## 2013-01-10 DIAGNOSIS — F319 Bipolar disorder, unspecified: Secondary | ICD-10-CM | POA: Diagnosis not present

## 2013-01-10 DIAGNOSIS — F411 Generalized anxiety disorder: Secondary | ICD-10-CM | POA: Insufficient documentation

## 2013-01-10 DIAGNOSIS — G8929 Other chronic pain: Secondary | ICD-10-CM | POA: Diagnosis not present

## 2013-01-10 DIAGNOSIS — R079 Chest pain, unspecified: Secondary | ICD-10-CM | POA: Diagnosis present

## 2013-01-10 DIAGNOSIS — Z862 Personal history of diseases of the blood and blood-forming organs and certain disorders involving the immune mechanism: Secondary | ICD-10-CM | POA: Diagnosis not present

## 2013-01-10 DIAGNOSIS — R0789 Other chest pain: Secondary | ICD-10-CM

## 2013-01-10 HISTORY — DX: Unspecified convulsions: R56.9

## 2013-01-10 LAB — CBC WITH DIFFERENTIAL/PLATELET
Basophils Absolute: 0 10*3/uL (ref 0.0–0.1)
Basophils Relative: 0 % (ref 0–1)
Eosinophils Absolute: 0.2 10*3/uL (ref 0.0–0.7)
Eosinophils Relative: 2 % (ref 0–5)
HCT: 38.3 % (ref 36.0–46.0)
Hemoglobin: 12.2 g/dL (ref 12.0–15.0)
Lymphocytes Relative: 21 % (ref 12–46)
Lymphs Abs: 2.1 10*3/uL (ref 0.7–4.0)
MCH: 25.7 pg — ABNORMAL LOW (ref 26.0–34.0)
MCHC: 31.9 g/dL (ref 30.0–36.0)
MCV: 80.6 fL (ref 78.0–100.0)
Monocytes Absolute: 0.4 10*3/uL (ref 0.1–1.0)
Monocytes Relative: 4 % (ref 3–12)
Neutro Abs: 7.3 10*3/uL (ref 1.7–7.7)
Neutrophils Relative %: 73 % (ref 43–77)
Platelets: 392 10*3/uL (ref 150–400)
RBC: 4.75 MIL/uL (ref 3.87–5.11)
RDW: 14.9 % (ref 11.5–15.5)
WBC: 10.1 10*3/uL (ref 4.0–10.5)

## 2013-01-10 LAB — COMPREHENSIVE METABOLIC PANEL
ALT: 14 U/L (ref 0–35)
AST: 13 U/L (ref 0–37)
Albumin: 3.5 g/dL (ref 3.5–5.2)
Alkaline Phosphatase: 99 U/L (ref 39–117)
BUN: 12 mg/dL (ref 6–23)
CO2: 27 mEq/L (ref 19–32)
Calcium: 9.7 mg/dL (ref 8.4–10.5)
Chloride: 105 mEq/L (ref 96–112)
Creatinine, Ser: 0.89 mg/dL (ref 0.50–1.10)
GFR calc Af Amer: >90 mL/min (ref >90)
GFR calc non Af Amer: 84 mL/min — ABNORMAL LOW (ref >90)
Glucose, Bld: 102 mg/dL — ABNORMAL HIGH (ref 70–99)
Potassium: 3.9 mEq/L (ref 3.5–5.1)
Sodium: 139 mEq/L (ref 135–145)
Total Bilirubin: <0.1 mg/dL — ABNORMAL LOW (ref 0.3–1.2)
Total Protein: 7.7 g/dL (ref 6.0–8.3)

## 2013-01-10 LAB — TROPONIN I: Troponin I: <0.3 ng/mL (ref <0.30)

## 2013-01-10 MED ORDER — OXYCODONE-ACETAMINOPHEN 5-325 MG PO TABS
2.0000 | ORAL_TABLET | Freq: Once | ORAL | Status: DC
  Filled 2013-01-10: qty 2

## 2013-01-10 NOTE — ED Provider Notes (Signed)
CSN: [id number]     Arrival date & time 01/10/13  2130 History  This chart was scribed for Hilario Quarry, MD by Valera Castle, ED Scribe. This patient was seen in room APA19/APA19 and the patient's care was started at 10:49 AM.    Chief Complaint  Patient presents with  . Chest Pain   Patient is a 32 y.o. female presenting with chest pain. The history is provided by the patient. No language interpreter was used.  Chest Pain Pain location:  L chest Pain quality: sharp   Radiates to: left flank. Pain severity:  Moderate Onset quality:  Sudden Duration: earlier this morning. Timing:  Constant Progression:  Unchanged Chronicity:  Recurrent Relieved by:  Nothing Worsened by:  Nothing tried Associated symptoms: lower extremity edema (left leg)   Associated symptoms: no abdominal pain and no nausea    HPI Comments: Brenda Richardson is a 32 y.o. female with a h/o chest pain who presents to the Emergency Department complaining of sudden, sharp, constant, central chest pain, that radiates to her left flank, onset since 2:00 AM this morning. She states that usually she just stays home for her chest pain, and deals with it, but that this morning the pain is worse than usual. She states she was seen by Dr. Julio Sicks who did an echocardiogram, that came back abnormal. She reports he then sent her to cardiologist who did another echocardiogram, as well as a stress test upon her f/u. She states that nothing makes the pain worse. She denies taking any medication for the pain. She reports associated left leg swelling. She denies abdominal pain, nausea, diarrhea, and any other associated symptoms. She reports being allergic to Aspirin. She reports h/o DM (weight based control), iron diffenciency, anemic, and seizures. She denies h/o blood clots, being on any estrogen, birth control.   PCP Jackie Plum, MD Dr. Gevena Mart  Past Medical History  Diagnosis Date  . Breast infection 04/04/2010     hospitalized, given antibiotics, referred to surgeon  . Bipolar disorder   . Vaginitis 04/04/2010  . Anemia   . Migraine headache   . Hemorrhoids, internal   . Obesity   . Diabetes mellitus   . Colonic adenoma   . Helicobacter pylori (H. pylori)     s/p treatment  . Anxiety   . Complication of anesthesia   . PONV (postoperative nausea and vomiting)   . Diverticulitis   . Chronic pain   . Vitamin B12 deficiency   . Seizures    Past Surgical History  Procedure Laterality Date  . Cholecystectomy    . Tubal ligation    . Appendectomy  2008    lower abd pain  . S/p hysterectomy  08/2007    No Cancer Cells  . Breast reduction surgery  08/2008    from a 44 DDD to a 44 C  . Bilateral salpingoophorectomy  06/2009  . Tumor removal      from left shoulder  . Colonoscopy  01/04/10    tubular adenoma of cecum, normal TI, patchy fibrotic changes involving sigm, desc s/p bx neg. Next TCS 12/2016  . Esophagogastroduodenoscopy  01/04/10    normal esophagus s/p dilation, small hh, H pylori gastritis  . Esophagogastroduodenoscopy  2008    florid gastroesophageal reflux during exam but normal mucosa  . Abdominal hysterectomy    . Wisdom tooth extraction     Family History  Problem Relation Age of Onset  . Cancer Mother  cervical, deceased age 2  . Cancer Father     lung cancer, deceased age 76  . Anesthesia problems Neg Hx   . Hypotension Neg Hx   . Malignant hyperthermia Neg Hx   . Pseudochol deficiency Neg Hx   . COPD Sister   . CAD Sister   . Heart attack Other    History  Substance Use Topics  . Smoking status: Never Smoker   . Smokeless tobacco: Never Used  . Alcohol Use: No   OB History   Grav Para Term Preterm Abortions TAB SAB Ect Mult Living                 Review of Systems  Cardiovascular: Positive for chest pain and leg swelling (left leg).  Gastrointestinal: Negative for nausea, abdominal pain and diarrhea.  All other systems reviewed and are  negative.    Allergies  Demerol; Prednisone; Tomato; Cephalosporins; Dust mite extract; Ketorolac tromethamine; Lipitor; Nalbuphine; Pineapple; Tramadol hcl; Vicodin; Zofran; and Aspirin  Home Medications   Current Outpatient Rx  Name  Route  Sig  Dispense  Refill  . ALPRAZolam (XANAX) 1 MG tablet   Oral   Take 1 mg by mouth 3 (three) times daily as needed for anxiety.         . busPIRone (BUSPAR) 15 MG tablet   Oral   Take 15 mg by mouth 2 (two) times daily.         . Cyanocobalamin (VITAMIN B-12 IJ)   Injection   Inject 1,000 mg as directed every 30 (thirty) days.          . eszopiclone (LUNESTA) 2 MG TABS   Oral   Take 2 mg by mouth at bedtime. Take immediately before bedtime         . lamoTRIgine (LAMICTAL) 200 MG tablet   Oral   Take 1 tablet (200 mg total) by mouth at bedtime. After first month titration is completed   30 tablet   12   . lisinopril (PRINIVIL,ZESTRIL) 5 MG tablet   Oral   Take 5 mg by mouth every morning.          Marland Kitchen oxyCODONE-acetaminophen (PERCOCET) 10-325 MG per tablet   Oral   Take 1 tablet by mouth every 4 (four) hours as needed for pain.         Marland Kitchen sertraline (ZOLOFT) 50 MG tablet   Oral   Take 50 mg by mouth daily.         Marland Kitchen topiramate (TOPAMAX) 25 MG tablet   Oral   Take 50 mg by mouth 2 (two) times daily.           Triage Vitals: BP 136/89  Pulse 62  Temp(Src) 98 F (36.7 C)  Resp 20  Ht 5\' 6"  (1.676 m)  Wt 236 lb (107.049 kg)  BMI 38.11 kg/m2  SpO2 97%  Physical Exam  Nursing note and vitals reviewed. Constitutional: She is oriented to person, place, and time. She appears well-developed and well-nourished. No distress.  HENT:  Head: Normocephalic and atraumatic.  Eyes: EOM are normal.  Cardiovascular: Normal rate, regular rhythm and normal heart sounds.  Exam reveals no gallop and no friction rub.   No murmur heard. Pulmonary/Chest: Effort normal. No respiratory distress. She has no wheezes. She has no  rales. She exhibits tenderness.  Chest wall tender to palpation that produces pain over the sternum to left of midline.  Neurological: She is alert and oriented to person, place,  and time.  Skin: Skin is warm and dry.  Psychiatric: She has a normal mood and affect. Her behavior is normal.    ED Course  Procedures (including critical care time)  DIAGNOSTIC STUDIES: Oxygen Saturation is 97% on room air, normal by my interpretation.    COORDINATION OF CARE: 10:54 AM-Discussed treatment plan which includes EKG, Cardiac monitoring, Oxycodone, CBC, CXR, Troponin, and CMP with pt at bedside and pt agreed to plan.   Labs Review Labs Reviewed  CBC WITH DIFFERENTIAL - Abnormal; Notable for the following:    MCH 25.7 (*)    All other components within normal limits  COMPREHENSIVE METABOLIC PANEL - Abnormal; Notable for the following:    Glucose, Bld 102 (*)    GFR calc non Af Amer 84 (*)    All other components within normal limits  TROPONIN I   Imaging Review Dg Chest Port 1 View  01/10/2013   CLINICAL DATA:  Chest pain.  EXAM: PORTABLE CHEST - 1 VIEW  COMPARISON:  07/29/2012  FINDINGS: The heart size and mediastinal contours are within normal limits. Both lungs are clear. The visualized skeletal structures are unremarkable.  IMPRESSION: No active disease.   Electronically Signed   By: Rosalie Gums M.D.   On: 01/10/2013 11:32    EKG Interpretation   None       Date: 01/10/2013  Rate: 62  Rhythm: normal sinus rhythm  QRS Axis: normal  Intervals: normal  ST/T Wave abnormalities: nonspecific ST changes  Conduction Disutrbances:none  Narrative Interpretation:   Old EKG Reviewed: none available and unchanged   MDM   Patient with chest pain that appears to be chest wall in etiology.  However, patient gives some history of recent abnormal echo and stress test.  Patient has had ongoing chest pain for at least 8 hours and has negative troponin and rules out for mi.  She is advised to   Check with her cardiologisst tomorrow.   I personally performed the services described in this documentation, which was scribed in my presence. The recorded information has been reviewed and considered.   Hilario Quarry, MD 01/10/13 1230

## 2013-01-10 NOTE — ED Notes (Signed)
Pt given meal tray, tolerating well.  Husband at bedside.  md notified of pt taking her meds from home.

## 2013-01-10 NOTE — ED Notes (Signed)
Pt c/o mid center chest pain that radiates to left chest area described as a sharp pain associated with sob, hoarseness that woke her up from a sleep this am pt states that she has been evaluated by cardiology office in [place] who advised pt that she has abnormal ekg and echo. The pain that had this am is the same type of pain that pt has been having and evaluated by cardiology for.

## 2013-01-10 NOTE — ED Notes (Signed)
Pt given detailed discharge instructions, verbalized understanding of all, no scripts given.  Declined offer for wheelchair to car.

## 2013-10-09 ENCOUNTER — Other Ambulatory Visit: Payer: Self-pay | Admitting: Neurology

## 2013-10-10 NOTE — Telephone Encounter (Signed)
Patient No Showed Last Appt  

## 2013-10-21 ENCOUNTER — Other Ambulatory Visit: Payer: Self-pay | Admitting: Neurology

## 2013-11-22 ENCOUNTER — Telehealth: Payer: Self-pay | Admitting: Neurology

## 2013-11-22 ENCOUNTER — Encounter (INDEPENDENT_AMBULATORY_CARE_PROVIDER_SITE_OTHER): Payer: Self-pay

## 2013-11-22 ENCOUNTER — Telehealth: Payer: Self-pay | Admitting: *Deleted

## 2013-11-22 ENCOUNTER — Ambulatory Visit (INDEPENDENT_AMBULATORY_CARE_PROVIDER_SITE_OTHER): Payer: PRIVATE HEALTH INSURANCE | Admitting: Neurology

## 2013-11-22 ENCOUNTER — Encounter: Payer: Self-pay | Admitting: Neurology

## 2013-11-22 VITALS — BP 126/83 | HR 66 | Ht 66.0 in | Wt 224.0 lb

## 2013-11-22 DIAGNOSIS — I10 Essential (primary) hypertension: Secondary | ICD-10-CM

## 2013-11-22 DIAGNOSIS — R569 Unspecified convulsions: Secondary | ICD-10-CM

## 2013-11-22 DIAGNOSIS — G43909 Migraine, unspecified, not intractable, without status migrainosus: Secondary | ICD-10-CM

## 2013-11-22 MED ORDER — BUTALBITAL-APAP-CAFFEINE 50-325-40 MG PO TABS: 1.0000 | ORAL_TABLET | Freq: Four times a day (QID) | ORAL | Status: DC | PRN

## 2013-11-22 MED ORDER — TOPIRAMATE 100 MG PO TABS: 100.0000 mg | ORAL_TABLET | Freq: Two times a day (BID) | ORAL | Status: DC

## 2013-11-22 MED ORDER — RIZATRIPTAN BENZOATE 10 MG PO TBDP: 10.0000 mg | ORAL_TABLET | ORAL | Status: DC | PRN

## 2013-11-22 MED ORDER — LAMOTRIGINE 100 MG PO TABS: 100.0000 mg | ORAL_TABLET | Freq: Two times a day (BID) | ORAL | Status: DC

## 2013-11-22 NOTE — Telephone Encounter (Signed)
Pt called requesting appt for today.  She is having migraine, since Thursday.   She states having tingling, dizziness, confusion.   She stated that she feels like she may have a seizure.  She has not taken her lamictal since last Wednesday.   I relayed that important to keep on seizure medications so she does not have seizures.  Made appt for 1230 today with Dr. Krista Blue.    Did not want to see NP.

## 2013-11-22 NOTE — Progress Notes (Signed)
[facility]  PATIENT: Brenda Richardson DOB: Jul 25, 1980  HISTORICAL (initial visit in July 2015)  Brenda Richardson is a 33 years old right-handed African American female, referred by her primary care physician Dr. Iona Beard Richardson for evaluation of seizure  She had a past medical history of hypertension, depression anxiety, was diagnosed with pseudoseizure in 2012, had excessive stress during that time, was treated temporarily with keppra, keppra later was tapered off, when she quit having pseudoseizure.  She began to have seizure again since July 2014, her seizures described as staring into space, body stiffness, jerking movement, lasting one to 2 minutes, she has no recollection of the event, there was occasionally tongue biting, urinary incontinence,  CT of the brain was reported normal, she has seizure about 2-3 times each week, she was started on topiramate 25 mg 2 tablets twice a day there was no significant improvement in her seizure frequency, there is no significant side effect.  She denies trouble walking, no memory loss, no visual change,  Laboratory evaluation showed elevated WBC 12.7.laboratory showed negative UDS normal BMP,   UPDATE Sep 14th 2015:  MRI brain and EEG was normal. Sleep study   She has 2 more seizure like activity since July 2015. This usually follows headaches, She complains of dizziness, bad headaches, to the points to nause, confusions, can lasting for one week, she has one every other week.  She had few years history of migraine headaches, previously has tried Imitrex, Zomig, Maxalt, with limited help.   REVIEW OF SYSTEMS: Full 14 system review of systems performed and notable only for  Light sensitivity, chest pain, leg swelling, food allergy, anemia, memory loss, dizziness, headaches, numbness, seizure, weakness, tremor, passing out, confusion, depression, back pain, muscle cramps, neck pain, stiffness  ALLERGIES: Allergies  Allergen  Reactions  . Demerol [Meperidine] Anaphylaxis and Hives  . Prednisone Anaphylaxis and Other (See Comments)  . Tomato Anaphylaxis, Shortness Of Breath, Itching and Swelling  . Cephalosporins Other (See Comments)    Jitters, Shaking, Cold Sweats.   . Dust Mite Extract   . Ketorolac Tromethamine Itching  . Lipitor [Atorvastatin] Other (See Comments)    Yellow Spots   . Nalbuphine Nausea And Vomiting  . Pineapple Swelling    Tongue   . Tramadol Hcl Hives, Itching and Nausea Only  . Vicodin [Hydrocodone-Acetaminophen] Nausea And Vomiting  . Zofran Itching    To be given Benadryl prior upon administration of this medication  . Aspirin Other (See Comments)    G.I. Upset.     HOME MEDICATIONS: Outpatient Prescriptions Prior to Visit  Medication Sig Dispense Refill  . ALPRAZolam (XANAX) 1 MG tablet Take 1 mg by mouth 3 (three) times daily as needed for anxiety.      . busPIRone (BUSPAR) 15 MG tablet Take 15 mg by mouth 2 (two) times daily.      . Cyanocobalamin (VITAMIN B-12 IJ) Inject 1,000 mg as directed every 30 (thirty) days.       . eszopiclone (LUNESTA) 2 MG TABS Take 2 mg by mouth at bedtime. Take immediately before bedtime      . lamoTRIgine (LAMICTAL) 200 MG tablet TAKE 1 TABLET (200 MG TOTAL) BY MOUTH AT BEDTIME. AFTER FIRST MONTH TITRATION IS COMPLETED  10 tablet  0  . lisinopril (PRINIVIL,ZESTRIL) 5 MG tablet Take 5 mg by mouth every morning.       Marland Kitchen oxyCODONE-acetaminophen (PERCOCET) 10-325 MG per tablet Take 1 tablet by mouth every 4 (four) hours as  needed for pain.      Marland Kitchen sertraline (ZOLOFT) 50 MG tablet Take 50 mg by mouth daily.      Marland Kitchen topiramate (TOPAMAX) 25 MG tablet Take 50 mg by mouth 2 (two) times daily.        No facility-administered medications prior to visit.    PAST MEDICAL HISTORY: Past Medical History  Diagnosis Date  . Breast infection 04/04/2010    hospitalized, given antibiotics, referred to surgeon  . Bipolar disorder   . Vaginitis 04/04/2010  .  Anemia   . Migraine headache   . Hemorrhoids, internal   . Obesity   . Diabetes mellitus   . Colonic adenoma   . Helicobacter pylori (H. pylori)     s/p treatment  . Anxiety   . Complication of anesthesia   . PONV (postoperative nausea and vomiting)   . Diverticulitis   . Chronic pain   . Vitamin B12 deficiency   . Seizures     PAST SURGICAL HISTORY: Past Surgical History  Procedure Laterality Date  . Cholecystectomy    . Tubal ligation    . Appendectomy  2008    lower abd pain  . S/p hysterectomy  08/2007    No Cancer Cells  . Breast reduction surgery  08/2008    from a 44 DDD to a 44 C  . Bilateral salpingoophorectomy  06/2009  . Tumor removal      from left shoulder  . Colonoscopy  01/04/10    tubular adenoma of cecum, normal TI, patchy fibrotic changes involving sigm, desc s/p bx neg. Next TCS 12/2016  . Esophagogastroduodenoscopy  01/04/10    normal esophagus s/p dilation, small hh, H pylori gastritis  . Esophagogastroduodenoscopy  2008    florid gastroesophageal reflux during exam but normal mucosa  . Abdominal hysterectomy    . Wisdom tooth extraction      FAMILY HISTORY: Family History  Problem Relation Age of Onset  . Cancer Mother     cervical, deceased age 56  . Cancer Father     lung cancer, deceased age 30  . Anesthesia problems Neg Hx   . Hypotension Neg Hx   . Malignant hyperthermia Neg Hx   . Pseudochol deficiency Neg Hx   . COPD Sister   . CAD Sister   . Heart attack Other     SOCIAL HISTORY:  History   Social History  . Marital Status: Married    Spouse Name: Izell [place]    Number of Children: 3  . Years of Education: 7th   Occupational History  .      Disabled   Social History Main Topics  . Smoking status: Never Smoker   . Smokeless tobacco: Never Used  . Alcohol Use: No  . Drug Use: No  . Sexual Activity: Yes    Birth Control/ Protection: Surgical   Other Topics Concern  . Not on file   Social History Narrative   Patient  lives at home with her husband Izell [place]). Patient has 7th grade education. Patient is disabled.   Right handed.   Caffeine- Very rare.     PHYSICAL EXAM   09/29/12 1055  BP: 119/84  Pulse: 73  Height: 5\' 5"  (1.651 m)  Weight: 256 lb (116.121 kg)     There is no weight on file to calculate BMI.   Generalized: In no acute distress  Neck: Supple, no carotid bruits   Cardiac: Regular rate rhythm  Pulmonary: Clear to auscultation bilaterally  Musculoskeletal: No deformity  Neurological examination  Mentation: Alert oriented to time, place, history taking, and causual conversation  Cranial nerve II-XII: Pupils were equal round reactive to light extraocular movements were full, visual field were full on confrontational test. facial sensation and strength were normal. hearing was intact to finger rubbing bilaterally. Uvula tongue midline.  head turning and shoulder shrug and were normal and symmetric.Tongue protrusion into cheek strength was normal.  Motor: normal tone, bulk and strength.  Sensory: Intact to fine touch, pinprick, preserved vibratory sensation, and proprioception at toes.  Coordination: Normal finger to nose, heel-to-shin bilaterally there was no truncal ataxia  Gait: Rising up from seated position without assistance, normal stance, without trunk ataxia, moderate stride, good arm swing, smooth turning, able to perform tiptoe, and heel walking without difficulty.   Romberg signs: Negative  Deep tendon reflexes: Brachioradialis 2/2, biceps 2/2, triceps 2/2, patellar 2/2, Achilles 2/2, plantar responses were flexor bilaterally.   DIAGNOSTIC DATA (LABS, IMAGING, TESTING) - I reviewed patient records, labs, notes, testing and imaging myself where available.  Lab Results  Component Value Date   WBC 10.1 01/10/2013   HGB 12.2 01/10/2013   HCT 38.3 01/10/2013   MCV 80.6 01/10/2013   PLT 392 01/10/2013      Component Value Date/Time   NA 139 01/10/2013 1105   K  3.9 01/10/2013 1105   CL 105 01/10/2013 1105   CO2 27 01/10/2013 1105   GLUCOSE 102* 01/10/2013 1105   BUN 12 01/10/2013 1105   CREATININE 0.89 01/10/2013 1105   CALCIUM 9.7 01/10/2013 1105   PROT 7.7 01/10/2013 1105   ALBUMIN 3.5 01/10/2013 1105   AST 13 01/10/2013 1105   ALT 14 01/10/2013 1105   ALKPHOS 99 01/10/2013 1105   BILITOT <0.1* 01/10/2013 1105   GFRNONAA 84* 01/10/2013 1105   GFRAA >90 01/10/2013 1105   Lab Results  Component Value Date   CHOL  Value: 170        ATP III CLASSIFICATION:  <200     mg/dL   Desirable  200-239  mg/dL   Borderline High  >=240    mg/dL   High        08/16/2009   HDL 49 08/16/2009   LDLCALC  Value: 103        Total Cholesterol/HDL:CHD Risk Coronary Heart Disease Risk Table                     Men   Women  1/2 Average Risk   3.4   3.3  Average Risk       5.0   4.4  2 X Average Risk   9.6   7.1  3 X Average Risk  23.4   11.0        Use the calculated Patient Ratio above and the CHD Risk Table to determine the patient's CHD Risk.        ATP III CLASSIFICATION (LDL):  <100     mg/dL   Optimal  100-129  mg/dL   Near or Above                    Optimal  130-159  mg/dL   Borderline  160-189  mg/dL   High  >190     mg/dL   Very High* 08/16/2009   TRIG 88 08/16/2009   CHOLHDL 3.5 08/16/2009   Lab Results  Component Value Date   HGBA1C  Value: 7.3 (NOTE)  According to the ADA Clinical Practice Recommendations for 2011, when HbA1c is used as a screening test:   >=6.5%   Diagnostic of Diabetes Mellitus           (if abnormal result  is confirmed)  5.7-6.4%   Increased risk of developing Diabetes Mellitus  References:Diagnosis and Classification of Diabetes Mellitus,Diabetes PQZR,0076,22(QJFHL 1):S62-S69 and Standards of Medical Care in         Diabetes - 2011,Diabetes KTGY,5638,93  (Suppl 1):S11-S61.* 04/01/2010   Lab Results  Component Value Date   VITAMINB12 340 12/19/2010   Lab Results  Component Value Date    TSH 1.472  08/15/2009    ASSESSMENT AND PLAN  33 years old [place] American female, with history of pseudoseizure, now presenting with seizure like activity again, normal neurological examination, history most consistent with pseudoseizure, her seizure-like activity mostly happens under stress, or following a prolonged migraine headaches,  MRI of the brain without contrast, EEG and exams were normal.  She is to continue to take lamotrigine 100 mg twice a day, I have increased her Topamax from 50, to 100 mg twice a day, Maxalt as needed Only return to clinic for new issues, she is also under the care of her primary care physician, and psychologist,       Marcial Pacas, M.D. Ph.D.  [facility] [address] [place], [place] [address] [phone]

## 2013-11-28 ENCOUNTER — Emergency Department (HOSPITAL_COMMUNITY)
Admission: EM | Admit: 2013-11-28 | Discharge: 2013-11-28 | Disposition: A | Discharge status: Home / Self Care | Payer: PRIVATE HEALTH INSURANCE | Attending: Emergency Medicine | Admitting: Emergency Medicine

## 2013-11-28 ENCOUNTER — Encounter (HOSPITAL_COMMUNITY): Payer: Self-pay | Admitting: Emergency Medicine

## 2013-11-28 ENCOUNTER — Emergency Department (HOSPITAL_COMMUNITY): Payer: PRIVATE HEALTH INSURANCE

## 2013-11-28 DIAGNOSIS — F319 Bipolar disorder, unspecified: Secondary | ICD-10-CM | POA: Insufficient documentation

## 2013-11-28 DIAGNOSIS — E669 Obesity, unspecified: Secondary | ICD-10-CM | POA: Diagnosis not present

## 2013-11-28 DIAGNOSIS — R519 Headache, unspecified: Secondary | ICD-10-CM

## 2013-11-28 DIAGNOSIS — G8929 Other chronic pain: Secondary | ICD-10-CM | POA: Insufficient documentation

## 2013-11-28 DIAGNOSIS — R51 Headache: Secondary | ICD-10-CM | POA: Diagnosis present

## 2013-11-28 DIAGNOSIS — Z79899 Other long term (current) drug therapy: Secondary | ICD-10-CM | POA: Diagnosis not present

## 2013-11-28 DIAGNOSIS — Z862 Personal history of diseases of the blood and blood-forming organs and certain disorders involving the immune mechanism: Secondary | ICD-10-CM | POA: Insufficient documentation

## 2013-11-28 DIAGNOSIS — G40909 Epilepsy, unspecified, not intractable, without status epilepticus: Secondary | ICD-10-CM | POA: Diagnosis not present

## 2013-11-28 DIAGNOSIS — G43909 Migraine, unspecified, not intractable, without status migrainosus: Secondary | ICD-10-CM | POA: Insufficient documentation

## 2013-11-28 DIAGNOSIS — E119 Type 2 diabetes mellitus without complications: Secondary | ICD-10-CM | POA: Insufficient documentation

## 2013-11-28 DIAGNOSIS — Z8719 Personal history of other diseases of the digestive system: Secondary | ICD-10-CM | POA: Insufficient documentation

## 2013-11-28 DIAGNOSIS — F411 Generalized anxiety disorder: Secondary | ICD-10-CM | POA: Insufficient documentation

## 2013-11-28 DIAGNOSIS — Z8619 Personal history of other infectious and parasitic diseases: Secondary | ICD-10-CM | POA: Diagnosis not present

## 2013-11-28 DIAGNOSIS — N644 Mastodynia: Secondary | ICD-10-CM | POA: Diagnosis not present

## 2013-11-28 MED ORDER — METOCLOPRAMIDE HCL 5 MG/ML IJ SOLN
10.0000 mg | Freq: Once | INTRAMUSCULAR | Status: AC
  Administered 2013-11-28: 10 mg via INTRAVENOUS
  Filled 2013-11-28: qty 2

## 2013-11-28 MED ORDER — METOCLOPRAMIDE HCL 10 MG PO TABS: 10.0000 mg | ORAL_TABLET | Freq: Four times a day (QID) | ORAL | Status: DC

## 2013-11-28 MED ORDER — DIPHENHYDRAMINE HCL 50 MG/ML IJ SOLN
50.0000 mg | Freq: Once | INTRAMUSCULAR | Status: AC
  Administered 2013-11-28: 50 mg via INTRAVENOUS
  Filled 2013-11-28: qty 1

## 2013-11-28 MED ORDER — SODIUM CHLORIDE 0.9 % IV BOLUS (SEPSIS)
1000.0000 mL | Freq: Once | INTRAVENOUS | Status: AC
  Administered 2013-11-28: 1000 mL via INTRAVENOUS

## 2013-11-28 MED ORDER — MORPHINE SULFATE 4 MG/ML IJ SOLN
4.0000 mg | Freq: Once | INTRAMUSCULAR | Status: AC
  Administered 2013-11-28: 4 mg via INTRAVENOUS
  Filled 2013-11-28: qty 1

## 2013-11-28 NOTE — ED Provider Notes (Signed)
CSN: [id number]     Arrival date & time 11/28/13  1137 History  This chart was scribed for Mariea Clonts, MD by Tula Nakayama, ED Scribe. This patient was seen in room APA15/APA15 and the patient's care was started at 1:00 PM.    Chief Complaint  Patient presents with  . Seizures  . Headache   The history is provided by the patient and the spouse. No language interpreter was used.   HPI Comments: SHADEN HIGLEY is a 33 y.o. female who presents to the Emergency Department complaining of gradually worsening HA with associated nausea and photophobia that started two weeks ago. She denies vomiting, tick bites, neck stiffness, fever and chills. Pt has hx of migraines, but states that current symptoms are overall similar. Pt has hx of pseudo-seizures and DM. Pt denies hx of brain bleeding, aneurysm. Pt recently increased dosage of Topamax, Lamictal and Maxalt with no relief to her symptoms.  Pt also complains of constant right breast pain that started 3 days ago. She had a breast reduction a couple of years ago followed by an infection. Pt denies discharge from breast or rash.  Past Medical History  Diagnosis Date  . Breast infection 04/04/2010    hospitalized, given antibiotics, referred to surgeon  . Bipolar disorder   . Vaginitis 04/04/2010  . Anemia   . Migraine headache   . Hemorrhoids, internal   . Obesity   . Diabetes mellitus   . Colonic adenoma   . Helicobacter pylori (H. pylori)     s/p treatment  . Anxiety   . Complication of anesthesia   . PONV (postoperative nausea and vomiting)   . Diverticulitis   . Chronic pain   . Vitamin B12 deficiency   . Seizures    Past Surgical History  Procedure Laterality Date  . Cholecystectomy    . Tubal ligation    . Appendectomy  2008    lower abd pain  . S/p hysterectomy  08/2007    No Cancer Cells  . Breast reduction surgery  08/2008    from a 44 DDD to a 44 C  . Bilateral salpingoophorectomy  06/2009  . Tumor removal     from left shoulder  . Colonoscopy  01/04/10    tubular adenoma of cecum, normal TI, patchy fibrotic changes involving sigm, desc s/p bx neg. Next TCS 12/2016  . Esophagogastroduodenoscopy  01/04/10    normal esophagus s/p dilation, small hh, H pylori gastritis  . Esophagogastroduodenoscopy  2008    florid gastroesophageal reflux during exam but normal mucosa  . Abdominal hysterectomy    . Wisdom tooth extraction     Family History  Problem Relation Age of Onset  . Cancer Mother     cervical, deceased age 35  . Cancer Father     lung cancer, deceased age 71  . Anesthesia problems Neg Hx   . Hypotension Neg Hx   . Malignant hyperthermia Neg Hx   . Pseudochol deficiency Neg Hx   . COPD Sister   . CAD Sister   . Heart attack Other    History  Substance Use Topics  . Smoking status: Never Smoker   . Smokeless tobacco: Never Used  . Alcohol Use: No   OB History   Grav Para Term Preterm Abortions TAB SAB Ect Mult Living                 Review of Systems  Constitutional: Negative for fever and chills.  Eyes: Positive for photophobia.  Gastrointestinal: Positive for nausea. Negative for vomiting.  Musculoskeletal: Negative for neck stiffness.       Breast pain  Skin: Negative for wound.  Neurological: Positive for headaches.  All other systems reviewed and are negative.  Allergies  Demerol; Prednisone; Tomato; Cephalosporins; Dust mite extract; Ketorolac tromethamine; Lipitor; Nalbuphine; Pineapple; Tramadol hcl; Vicodin; Zofran; and Aspirin  Home Medications   Prior to Admission medications   Medication Sig Start Date End Date Taking? Authorizing Provider  ALPRAZolam Duanne Moron) 1 MG tablet Take 1 mg by mouth 3 (three) times daily as needed for anxiety.    Historical Provider, MD  busPIRone (BUSPAR) 15 MG tablet Take 15 mg by mouth 2 (two) times daily.    Historical Provider, MD  Cyanocobalamin (VITAMIN B-12 IJ) Inject 1,000 mg as directed every 30 (thirty) days.      Historical Provider, MD  eszopiclone (LUNESTA) 2 MG TABS Take 2 mg by mouth at bedtime. Take immediately before bedtime    Historical Provider, MD  lamoTRIgine (LAMICTAL) 100 MG tablet Take 1 tablet (100 mg total) by mouth 2 (two) times daily. 11/22/13   Marcial Pacas, MD  lisinopril (PRINIVIL,ZESTRIL) 5 MG tablet Take 5 mg by mouth every morning.     Historical Provider, MD  OxyCODONE (OXYCONTIN) 10 mg T12A 12 hr tablet Take 10 mg by mouth every 12 (twelve) hours.    Historical Provider, MD  rizatriptan (MAXALT-MLT) 10 MG disintegrating tablet Take 1 tablet (10 mg total) by mouth as needed. May repeat in 2 hours if needed 11/22/13   Marcial Pacas, MD  sertraline (ZOLOFT) 50 MG tablet Take 50 mg by mouth daily.    Historical Provider, MD  topiramate (TOPAMAX) 100 MG tablet Take 1 tablet (100 mg total) by mouth 2 (two) times daily. 11/22/13   Marcial Pacas, MD   BP 112/70  Pulse 72  Temp(Src) 98.7 F (37.1 C) (Oral)  Resp 20  Ht 5\' 6"  (1.676 m)  Wt 215 lb (97.523 kg)  BMI 34.72 kg/m2  SpO2 100% Physical Exam  Nursing note and vitals reviewed. Constitutional: She appears well-developed and well-nourished. No distress.  HENT:  Head: Normocephalic and atraumatic.  Eyes: Conjunctivae and EOM are normal. Pupils are equal, round, and reactive to light.  Photophobia Visual fields grossly intact  Neck: Neck supple. No tracheal deviation present.  Cardiovascular: Normal rate.   Pulmonary/Chest: Effort normal. No respiratory distress.  Tenderness inferior right breast No obvious sign of skin infection, draining, infection, induration      Abdominal: Soft. There is no tenderness.  Musculoskeletal: She exhibits no tenderness.  Lymphadenopathy:    She has no axillary adenopathy.       Right axillary: No pectoral adenopathy present.  Neurological: No cranial nerve deficit. GCS eye subscore is 4. GCS verbal subscore is 5. GCS motor subscore is 6.  No obvious arm drift Finger-nose intact Sensation intact  to palpation bilateral LE 5+ strength bilateral LE 1+ reflexes bilateral LE No meningismus  Skin: Skin is warm and dry.  No tenderness, erythema, warmth or mass appreciated right breast, no dc  Psychiatric: She has a normal mood and affect. Her behavior is normal.    ED Course  Procedures (including critical care time)  DIAGNOSTIC STUDIES: Oxygen Saturation is 100% on RA, normal by my interpretation.    COORDINATION OF CARE: 1:04 PM Discussed hx of breast infection and advised pt to follow up with OBGYN. Will order morphine, Phentanol and Benadryl and CT Head.  Pt agreed to plan.   Labs Review Labs Reviewed - No data to display  Imaging Review No results found. Ct Head Wo Contrast  11/28/2013   CLINICAL DATA:  Seizures.  Headache.  EXAM: CT HEAD WITHOUT CONTRAST  TECHNIQUE: Contiguous axial images were obtained from the base of the skull through the vertex without intravenous contrast.  COMPARISON:  09/04/2012.  FINDINGS: Ventricles normal in size and configuration. No parenchymal masses or mass effect. No evidence of an infarct. There are no extra-axial masses or abnormal fluid collections.  No intracranial hemorrhage.  Visualized sinuses and mastoid air cells are clear. No skull lesion.  IMPRESSION: Normal unenhanced CT scan of the brain.   Electronically Signed   By: Lajean Manes M.D.   On: 11/28/2013 15:21    EKG Interpretation None      MDM   Final diagnoses:  Headache, unspecified headache type  Breast pain, right   I personally performed the services described in this documentation, which was scribed in my presence. The recorded information has been reviewed and is accurate.  Patient does not have clinical data infection the right breast and recommended ultrasound and primary care/OB/GYN followup.  Headache overall similar previous but more severe. CT head no acute findings. Patient has outpatient followup with neurology and primary Dr. for headache control. Headache  improved in ER with migraine cocktail.  Results and differential diagnosis were discussed with the patient/parent/guardian. Close follow up outpatient was discussed, comfortable with the plan.   Medications  sodium chloride 0.9 % bolus 1,000 mL (0 mLs Intravenous Stopped 11/28/13 1558)  metoCLOPramide (REGLAN) injection 10 mg (10 mg Intravenous Given 11/28/13 1400)  diphenhydrAMINE (BENADRYL) injection 50 mg (50 mg Intravenous Given 11/28/13 1400)  morphine 4 MG/ML injection 4 mg (4 mg Intravenous Given 11/28/13 1400)    Filed Vitals:   11/28/13 1157 11/28/13 1421 11/28/13 1559  BP: 112/70 132/86 108/79  Pulse: 72 74 64  Temp: 98.7 F (37.1 C)    TempSrc: Oral    Resp: 20 18 16   Height: 5\' 6"  (1.676 m)    Weight: 215 lb (97.523 kg)    SpO2: 100% 99% 100%    Final diagnoses:  Headache, unspecified headache type  Breast pain, right      Mariea Clonts, MD 12/03/13 [phone]

## 2013-11-28 NOTE — Discharge Instructions (Signed)
Take Benadryl with Reglan for significant headaches. Follow up with your outpatient Dr. and OB/GYN for right breast pain. See a physician if you develop redness, warmth, discharge or fevers with the breast pain.  If you were given medicines take as directed.  If you are on coumadin or contraceptives realize their levels and effectiveness is altered by many different medicines.  If you have any reaction (rash, tongues swelling, other) to the medicines stop taking and see a physician.   Please follow up as directed and return to the ER or see a physician for new or worsening symptoms.  Thank you. Filed Vitals:   11/28/13 1157 11/28/13 1421  BP: 112/70 132/86  Pulse: 72 74  Temp: 98.7 F (37.1 C)   TempSrc: Oral   Resp: 20 18  Height: 5\' 6"  (1.676 m)   Weight: 215 lb (97.523 kg)   SpO2: 100% 99%

## 2013-11-28 NOTE — ED Notes (Signed)
C/O throbbing frontal HA x 1.5 weeks.  According to husband had a seizure this morning, tonic in nature.  Associated dizziness and vision blurring over week. Nausea but no vomiting.  Visited neurologist Monday and had Lamictal and Topamax increased and was placed on new med (?Myletrex) as well, but none of this has helped. Patient also c/o R breast pain, states it feels like the time she had an abscess in same breast.

## 2013-12-10 DIAGNOSIS — E1165 Type 2 diabetes mellitus with hyperglycemia: Secondary | ICD-10-CM

## 2013-12-10 DIAGNOSIS — I10 Essential (primary) hypertension: Secondary | ICD-10-CM | POA: Insufficient documentation

## 2013-12-10 DIAGNOSIS — G629 Polyneuropathy, unspecified: Secondary | ICD-10-CM | POA: Insufficient documentation

## 2013-12-10 DIAGNOSIS — E1149 Type 2 diabetes mellitus with other diabetic neurological complication: Secondary | ICD-10-CM | POA: Insufficient documentation

## 2013-12-10 DIAGNOSIS — IMO0001 Reserved for inherently not codable concepts without codable children: Secondary | ICD-10-CM | POA: Insufficient documentation

## 2013-12-10 DIAGNOSIS — G43019 Migraine without aura, intractable, without status migrainosus: Secondary | ICD-10-CM | POA: Insufficient documentation

## 2013-12-10 DIAGNOSIS — F3130 Bipolar disorder, current episode depressed, mild or moderate severity, unspecified: Secondary | ICD-10-CM | POA: Insufficient documentation

## 2013-12-10 DIAGNOSIS — R6889 Other general symptoms and signs: Secondary | ICD-10-CM

## 2013-12-10 DIAGNOSIS — M545 Low back pain, unspecified: Secondary | ICD-10-CM | POA: Insufficient documentation

## 2013-12-10 HISTORY — DX: Polyneuropathy, unspecified: G62.9

## 2013-12-24 NOTE — Telephone Encounter (Signed)
error 

## 2014-01-22 ENCOUNTER — Other Ambulatory Visit: Payer: Self-pay | Admitting: Family Medicine

## 2014-02-26 ENCOUNTER — Encounter (HOSPITAL_COMMUNITY): Payer: Self-pay | Admitting: Emergency Medicine

## 2014-02-26 ENCOUNTER — Emergency Department (HOSPITAL_COMMUNITY)
Admission: EM | Admit: 2014-02-26 | Discharge: 2014-02-26 | Disposition: A | Discharge status: Home / Self Care | Payer: PRIVATE HEALTH INSURANCE | Attending: Emergency Medicine | Admitting: Emergency Medicine

## 2014-02-26 DIAGNOSIS — Z862 Personal history of diseases of the blood and blood-forming organs and certain disorders involving the immune mechanism: Secondary | ICD-10-CM | POA: Diagnosis not present

## 2014-02-26 DIAGNOSIS — Z8719 Personal history of other diseases of the digestive system: Secondary | ICD-10-CM | POA: Insufficient documentation

## 2014-02-26 DIAGNOSIS — G8929 Other chronic pain: Secondary | ICD-10-CM | POA: Insufficient documentation

## 2014-02-26 DIAGNOSIS — E119 Type 2 diabetes mellitus without complications: Secondary | ICD-10-CM | POA: Diagnosis not present

## 2014-02-26 DIAGNOSIS — F319 Bipolar disorder, unspecified: Secondary | ICD-10-CM | POA: Insufficient documentation

## 2014-02-26 DIAGNOSIS — Z79891 Long term (current) use of opiate analgesic: Secondary | ICD-10-CM | POA: Insufficient documentation

## 2014-02-26 DIAGNOSIS — F419 Anxiety disorder, unspecified: Secondary | ICD-10-CM | POA: Diagnosis not present

## 2014-02-26 DIAGNOSIS — G40909 Epilepsy, unspecified, not intractable, without status epilepticus: Secondary | ICD-10-CM | POA: Insufficient documentation

## 2014-02-26 DIAGNOSIS — M545 Low back pain, unspecified: Secondary | ICD-10-CM

## 2014-02-26 DIAGNOSIS — Z79899 Other long term (current) drug therapy: Secondary | ICD-10-CM | POA: Insufficient documentation

## 2014-02-26 DIAGNOSIS — Z8619 Personal history of other infectious and parasitic diseases: Secondary | ICD-10-CM | POA: Insufficient documentation

## 2014-02-26 DIAGNOSIS — E538 Deficiency of other specified B group vitamins: Secondary | ICD-10-CM | POA: Insufficient documentation

## 2014-02-26 DIAGNOSIS — Z8742 Personal history of other diseases of the female genital tract: Secondary | ICD-10-CM | POA: Diagnosis not present

## 2014-02-26 DIAGNOSIS — M5432 Sciatica, left side: Secondary | ICD-10-CM | POA: Diagnosis not present

## 2014-02-26 DIAGNOSIS — E669 Obesity, unspecified: Secondary | ICD-10-CM | POA: Diagnosis not present

## 2014-02-26 MED ORDER — OXYCODONE-ACETAMINOPHEN 5-325 MG PO TABS: 1.0000 | ORAL_TABLET | Freq: Once | ORAL | Status: DC

## 2014-02-26 MED ORDER — IBUPROFEN 800 MG PO TABS: 800.0000 mg | ORAL_TABLET | Freq: Three times a day (TID) | ORAL | Status: DC

## 2014-02-26 MED ORDER — DIAZEPAM 5 MG PO TABS: 10.0000 mg | ORAL_TABLET | Freq: Once | ORAL | Status: DC

## 2014-02-26 NOTE — ED Notes (Addendum)
Pt c/o left sided lower back into buttocks and leg x several days; pt noted to have distended abd which pt sts has hx of same but is not her norm; pt sts some swelling in legs; pt sts hx of "mass on my liver"

## 2014-02-26 NOTE — Discharge Instructions (Signed)
Back Exercises Back exercises help treat and prevent back injuries. The goal is to increase your strength in your belly (abdominal) and back muscles. These exercises can also help with flexibility. Start these exercises when told by your doctor. HOME CARE Back exercises include: Pelvic Tilt.  Lie on your back with your knees bent. Tilt your pelvis until the lower part of your back is against the floor. Hold this position 5 to 10 sec. Repeat this exercise 5 to 10 times. Knee to Chest.  Pull 1 knee up against your chest and hold for 20 to 30 seconds. Repeat this with the other knee. This may be done with the other leg straight or bent, whichever feels better. Then, pull both knees up against your chest. Sit-Ups or Curl-Ups.  Bend your knees 90 degrees. Start with tilting your pelvis, and do a partial, slow sit-up. Only lift your upper half 30 to 45 degrees off the floor. Take at least 2 to 3 seonds for each sit-up. Do not do sit-ups with your knees out straight. If partial sit-ups are difficult, simply do the above but with only tightening your belly (abdominal) muscles and holding it as told. Hip-Lift.  Lie on your back with your knees flexed 90 degrees. Push down with your feet and shoulders as you raise your hips 2 inches off the floor. Hold for 10 seconds, repeat 5 to 10 times. Back Arches.  Lie on your stomach. Prop yourself up on bent elbows. Slowly press on your hands, causing an arch in your low back. Repeat 3 to 5 times. Shoulder-Lifts.  Lie face down with arms beside your body. Keep hips and belly pressed to floor as you slowly lift your head and shoulders off the floor. Do not overdo your exercises. Be careful in the beginning. Exercises may cause you some mild back discomfort. If the pain lasts for more than 15 minutes, stop the exercises until you see your doctor. Improvement with exercise for back problems is slow.  Document Released: 03/30/2010 Document Revised: 05/20/2011  Document Reviewed: 12/27/2010 [facility] Patient Information 2015 [place], [place]. This information is not intended to replace advice given to you by your health care provider. Make sure you discuss any questions you have with your health care provider.  Back Injury Prevention Back injuries can be extremely painful and difficult to heal. After having one back injury, you are much more likely to experience another later on. It is important to learn how to avoid injuring or re-injuring your back. The following tips can help you to prevent a back injury. PHYSICAL FITNESS  Exercise regularly and try to develop good tone in your abdominal muscles. Your abdominal muscles provide a lot of the support needed by your back.  Do aerobic exercises (walking, jogging, biking, swimming) regularly.  Do exercises that increase balance and strength (tai chi, yoga) regularly. This can decrease your risk of falling and injuring your back.  Stretch before and after exercising.  Maintain a healthy weight. The more you weigh, the more stress is placed on your back. For every pound of weight, 10 times that amount of pressure is placed on the back. DIET  Talk to your caregiver about how much calcium and vitamin D you need per day. These nutrients help to prevent weakening of the bones (osteoporosis). Osteoporosis can cause broken (fractured) bones that lead to back pain.  Include good sources of calcium in your diet, such as dairy products, green, leafy vegetables, and products with calcium added (fortified).  Include good sources of vitamin D in your diet, such as milk and foods that are fortified with vitamin D. °· Consider taking a nutritional supplement or a multivitamin if needed. °· Stop smoking if you smoke. °POSTURE °· Sit and stand up straight. Avoid leaning forward when you sit or hunching over when you stand. °· Choose chairs with good low back (lumbar) support. °· If you work at a desk, sit close to your work  so you do not need to lean over. Keep your chin tucked in. Keep your neck drawn back and elbows bent at a right angle. Your arms should look like the letter "L." °· Sit high and close to the steering wheel when you drive. Add a lumbar support to your car seat if needed. °· Avoid sitting or standing in one position for too long. Take breaks to get up, stretch, and walk around at least once every hour. Take breaks if you are driving for long periods of time. °· Sleep on your side with your knees slightly bent, or sleep on your back with a pillow under your knees. Do not sleep on your stomach. °LIFTING, TWISTING, AND REACHING °· Avoid heavy lifting, especially repetitive lifting. If you must do heavy lifting: °¨ Stretch before lifting. °¨ Work slowly. °¨ Rest between lifts. °¨ Use carts and dollies to move objects when possible. °¨ Make several small trips instead of carrying 1 heavy load. °¨ Ask for help when you need it. °¨ Ask for help when moving big, awkward objects. °· Follow these steps when lifting: °¨ Stand with your feet shoulder-width apart. °¨ Get as close to the object as you can. Do not try to pick up heavy objects that are far from your body. °¨ Use handles or lifting straps if they are available. °¨ Bend at your knees. Squat down, but keep your heels off the floor. °¨ Keep your shoulders pulled back, your chin tucked in, and your back straight. °¨ Lift the object slowly, tightening the muscles in your legs, abdomen, and buttocks. Keep the object as close to the center of your body as possible. °¨ When you put a load down, use these same guidelines in reverse. °· Do not: °¨ Lift the object above your waist. °¨ Twist at the waist while lifting or carrying a load. Move your feet if you need to turn, not your waist. °¨ Bend over without bending at your knees. °· Avoid reaching over your head, across a table, or for an object on a high surface. °OTHER TIPS °· Avoid wet floors and keep sidewalks clear of ice  to prevent falls. °· Do not sleep on a mattress that is too soft or too hard. °· Keep items that are used frequently within easy reach. °· Put heavier objects on shelves at waist level and lighter objects on lower or higher shelves. °· Find ways to decrease your stress, such as exercise, massage, or relaxation techniques. Stress can build up in your muscles. Tense muscles are more vulnerable to injury. °· Seek treatment for depression or anxiety if needed. These conditions can increase your risk of developing back pain. °SEEK MEDICAL CARE IF: °· You injure your back. °· You have questions about diet, exercise, or other ways to prevent back injuries. °MAKE SURE YOU: °· Understand these instructions. °· Will watch your condition. °· Will get help right away if you are not doing well or get worse. °Document Released: 04/04/2004 Document Revised: 05/20/2011 Document Reviewed: 04/08/2011 °[employer]® Patient Information ©  2015 [employer]. This information is not intended to replace advice given to you by your health care provider. Make sure you discuss any questions you have with your health care provider.

## 2014-02-26 NOTE — ED Provider Notes (Signed)
CSN: [id number]     Arrival date & time 02/26/14  1221 History   First MD Initiated Contact with Patient 02/26/14 1245     Chief Complaint  Patient presents with  . Back Pain  . Leg Pain     (Consider location/radiation/quality/duration/timing/severity/associated sxs/prior Treatment) HPI  Pt is a 33yo female with hx of bipolar disorder and chronic pain who goes to a pain clinic for chronic back pain, presenting to ED with c/o gradually worsening lower back pain on left side, "it's over 10/10" sharp and aching, radiating down left leg past her knee.  Pain is worse with ambulation and certain movements.  Pt reports symptoms started after she lifted a heavy box. Pt states she knows she is not suppose to lift   Past Medical History  Diagnosis Date  . Breast infection 04/04/2010    hospitalized, given antibiotics, referred to surgeon  . Bipolar disorder   . Vaginitis 04/04/2010  . Anemia   . Migraine headache   . Hemorrhoids, internal   . Obesity   . Diabetes mellitus   . Colonic adenoma   . Helicobacter pylori (H. pylori)     s/p treatment  . Anxiety   . Complication of anesthesia   . PONV (postoperative nausea and vomiting)   . Diverticulitis   . Chronic pain   . Vitamin B12 deficiency   . Seizures    Past Surgical History  Procedure Laterality Date  . Cholecystectomy    . Tubal ligation    . Appendectomy  2008    lower abd pain  . S/p hysterectomy  08/2007    No Cancer Cells  . Breast reduction surgery  08/2008    from a 44 DDD to a 44 C  . Bilateral salpingoophorectomy  06/2009  . Tumor removal      from left shoulder  . Colonoscopy  01/04/10    tubular adenoma of cecum, normal TI, patchy fibrotic changes involving sigm, desc s/p bx neg. Next TCS 12/2016  . Esophagogastroduodenoscopy  01/04/10    normal esophagus s/p dilation, small hh, H pylori gastritis  . Esophagogastroduodenoscopy  2008    florid gastroesophageal reflux during exam but normal mucosa  .  Abdominal hysterectomy    . Wisdom tooth extraction     Family History  Problem Relation Age of Onset  . Cancer Mother     cervical, deceased age 35  . Cancer Father     lung cancer, deceased age 32  . Anesthesia problems Neg Hx   . Hypotension Neg Hx   . Malignant hyperthermia Neg Hx   . Pseudochol deficiency Neg Hx   . COPD Sister   . CAD Sister   . Heart attack Other    History  Substance Use Topics  . Smoking status: Never Smoker   . Smokeless tobacco: Never Used  . Alcohol Use: No   OB History    No data available     Review of Systems  Constitutional: Negative for fever, chills, appetite change and fatigue.  Respiratory: Negative for cough and shortness of breath.   Cardiovascular: Negative for chest pain and palpitations.  Gastrointestinal: Negative for nausea, vomiting, abdominal pain and diarrhea.  Musculoskeletal: Positive for myalgias ( left leg pain) and back pain ( lower back, left side). Negative for neck pain and neck stiffness.  Neurological: Negative for weakness and numbness.  All other systems reviewed and are negative.     Allergies  Demerol; Prednisone; Tomato; Cephalosporins; Dust mite  extract; Ketorolac tromethamine; Lipitor; Nalbuphine; Pineapple; Tramadol hcl; Vicodin; Zofran; and Aspirin  Home Medications   Prior to Admission medications   Medication Sig Start Date End Date Taking? Authorizing Provider  ALPRAZolam Duanne Moron) 1 MG tablet Take 1 mg by mouth 3 (three) times daily as needed for anxiety.   Yes Historical Provider, MD  busPIRone (BUSPAR) 15 MG tablet Take 15 mg by mouth 2 (two) times daily.   Yes Historical Provider, MD  Cyanocobalamin (VITAMIN B-12 IJ) Inject 1,000 mg as directed every 30 (thirty) days.    Yes Historical Provider, MD  eszopiclone (LUNESTA) 2 MG TABS Take 2 mg by mouth at bedtime. Take immediately before bedtime   Yes Historical Provider, MD  lamoTRIgine (LAMICTAL) 100 MG tablet Take 1 tablet (100 mg total) by mouth  2 (two) times daily. 11/22/13  Yes Marcial Pacas, MD  lisinopril (PRINIVIL,ZESTRIL) 5 MG tablet Take 5 mg by mouth every morning.    Yes Historical Provider, MD  Oxycodone HCl 10 MG TABS Take 10 mg by mouth every 6 (six) hours as needed (for pain).   Yes Historical Provider, MD  sertraline (ZOLOFT) 50 MG tablet Take 50 mg by mouth daily.   Yes Historical Provider, MD  topiramate (TOPAMAX) 100 MG tablet Take 1 tablet (100 mg total) by mouth 2 (two) times daily. 11/22/13  Yes Marcial Pacas, MD  Vitamin D, Ergocalciferol, (DRISDOL) 50000 UNITS CAPS capsule Take 50,000 Units by mouth every 7 (seven) days.   Yes Historical Provider, MD  ibuprofen (ADVIL,MOTRIN) 800 MG tablet Take 1 tablet (800 mg total) by mouth 3 (three) times daily. Take three times daily for 3 days for low back pain. 02/26/14   Noland Fordyce, PA-C  metoCLOPramide (REGLAN) 10 MG tablet Take 1 tablet (10 mg total) by mouth every 6 (six) hours. Patient not taking: Reported on 02/26/2014 11/28/13   Mariea Clonts, MD  OxyCODONE (OXYCONTIN) 10 mg T12A 12 hr tablet Take 10 mg by mouth every 12 (twelve) hours.    Historical Provider, MD  rizatriptan (MAXALT-MLT) 10 MG disintegrating tablet Take 1 tablet (10 mg total) by mouth as needed. May repeat in 2 hours if needed 11/22/13   Marcial Pacas, MD   BP 122/91 mmHg  Pulse 78  Temp(Src) 97.6 F (36.4 C) (Oral)  Resp 14  SpO2 100% Physical Exam  Constitutional: She appears well-developed and well-nourished. No distress.  HENT:  Head: Normocephalic and atraumatic.  Eyes: Conjunctivae are normal. No scleral icterus.  Neck: Normal range of motion.  Cardiovascular: Normal rate, regular rhythm and normal heart sounds.   Pulmonary/Chest: Effort normal and breath sounds normal. No respiratory distress. She has no wheezes. She has no rales. She exhibits no tenderness.  Abdominal: Soft. Bowel sounds are normal. She exhibits no distension and no mass. There is no tenderness. There is no rebound and no guarding.   Musculoskeletal: Normal range of motion. She exhibits no edema or tenderness.  FROM all extremities, negative straight leg raise bilaterally. 5/5 strength in upper and lower extremities bilaterally. No midline cervical, thoracic, or lumbar tenderness. No muscular tenderness. Antalgic gait.   Neurological: She is alert.  Sensation to light and sharp touch in tact.  Skin: Skin is warm and dry. She is not diaphoretic.  Nursing note and vitals reviewed.   ED Course  Procedures (including critical care time) Labs Review Labs Reviewed - No data to display  Imaging Review No results found.   EKG Interpretation None      MDM  Final diagnoses:  Low back pain  Left sciatic nerve pain    pt is a 32yo female with hx of chronic pain and a "pinched nerve" presenting to ED with c/o exacerbation of lower back pain after lifting a heavy box 4 days ago.  Denies hx of back surgery, cancer, or IVDU. Denies change in bowel or bladder habits. No focal neuro deficits on exam. No hx of direct trauma to spine. Pt does not have any bony tenderness, however, pt insisted on getting an xray even after it was explained that xrays will likely not be of benefit as pt has chronic pain. Doubt acute fracture. Doubt cauda equina or spinal abscess.  1:17 PM Pt offered 10mg  valium as well as percocet for pain as she is "allergic" to NSAIDs and steroids. Pt declined stating her home medication is stronger, even though pt states she has been taking her home medications w/o relief.   Discussed pt with Dr. Vanita Panda who also examined pt. Advised once xray was complete, pt may be D/C home to f/u with ortho and tx with ibuprofen.   2:46 PM Pt now declining imaging of her back.  Requesting to be discharged home. Will discharged home to f/u with PCP, pain management as well as [employer]. Home care instructions provided. Will tx with ibuprofen 800mg  TID for 3 days. Pt verbalized understanding and agreement with tx  plan.    Noland Fordyce, PA-C 02/26/14 1541  Carmin Muskrat, MD 02/26/14 [phone]

## 2014-02-26 NOTE — ED Notes (Signed)
Xray transporter to get patient. Pt refusing answer, states she doesn't want it now. Dr. Vanita Panda made aware.

## 2014-02-28 DIAGNOSIS — F4310 Post-traumatic stress disorder, unspecified: Secondary | ICD-10-CM | POA: Insufficient documentation

## 2014-03-15 ENCOUNTER — Ambulatory Visit (HOSPITAL_COMMUNITY): Payer: PRIVATE HEALTH INSURANCE | Admitting: Physical Therapy

## 2014-03-22 ENCOUNTER — Ambulatory Visit (HOSPITAL_COMMUNITY): Payer: No Typology Code available for payment source | Attending: Physician Assistant | Admitting: Physical Therapy

## 2014-03-30 ENCOUNTER — Ambulatory Visit (HOSPITAL_COMMUNITY): Payer: No Typology Code available for payment source | Admitting: Physical Therapy

## 2014-05-12 ENCOUNTER — Encounter: Payer: Medicare Other | Attending: Family Medicine | Admitting: Nutrition

## 2014-05-12 ENCOUNTER — Telehealth: Payer: Self-pay | Admitting: Nutrition

## 2014-05-12 NOTE — Telephone Encounter (Signed)
Called and left message on home vm to call to reschedule missed appointment. Cell phone didn't have VM set up. Jearld Fenton, RDN CDE

## 2014-09-15 ENCOUNTER — Other Ambulatory Visit: Payer: Self-pay | Admitting: Neurology

## 2014-09-15 NOTE — Telephone Encounter (Signed)
[facility] is requesting a Rx for Topamax 100mg  two tabs twice daily.  Our records show the patent should be taking one tab twice daily.  I called the pharmacy.  Spoke with Coreen.  She said the patient was prescribed new dose of two tabs twice daily by Donald Pore from [facility].  This dose was started in March.  They will contact that office instead to request new Rx and will callus back if anything further is needed.

## 2014-10-14 ENCOUNTER — Telehealth (HOSPITAL_BASED_OUTPATIENT_CLINIC_OR_DEPARTMENT_OTHER): Payer: Self-pay

## 2014-10-14 NOTE — Telephone Encounter (Signed)
Tried calling pt to schedule appt but we got disconnected. Tried calling back, no answer

## 2014-12-16 ENCOUNTER — Other Ambulatory Visit: Payer: Self-pay | Admitting: Neurology

## 2015-06-12 DIAGNOSIS — G4701 Insomnia due to medical condition: Secondary | ICD-10-CM | POA: Insufficient documentation

## 2015-06-27 DIAGNOSIS — E559 Vitamin D deficiency, unspecified: Secondary | ICD-10-CM | POA: Insufficient documentation

## 2015-06-27 DIAGNOSIS — E538 Deficiency of other specified B group vitamins: Secondary | ICD-10-CM | POA: Insufficient documentation

## 2015-06-27 HISTORY — DX: Deficiency of other specified B group vitamins: E53.8

## 2015-09-20 DIAGNOSIS — R7301 Impaired fasting glucose: Secondary | ICD-10-CM | POA: Insufficient documentation

## 2015-10-06 DIAGNOSIS — M47816 Spondylosis without myelopathy or radiculopathy, lumbar region: Secondary | ICD-10-CM | POA: Insufficient documentation

## 2015-10-19 DIAGNOSIS — M47817 Spondylosis without myelopathy or radiculopathy, lumbosacral region: Secondary | ICD-10-CM | POA: Insufficient documentation

## 2015-10-26 ENCOUNTER — Encounter (HOSPITAL_COMMUNITY): Payer: Self-pay

## 2015-10-26 ENCOUNTER — Emergency Department (HOSPITAL_COMMUNITY)
Admission: EM | Admit: 2015-10-26 | Discharge: 2015-10-26 | Disposition: A | Discharge status: Home / Self Care | Payer: Medicare Other | Attending: Emergency Medicine | Admitting: Emergency Medicine

## 2015-10-26 DIAGNOSIS — Z79899 Other long term (current) drug therapy: Secondary | ICD-10-CM | POA: Diagnosis not present

## 2015-10-26 DIAGNOSIS — M549 Dorsalgia, unspecified: Secondary | ICD-10-CM

## 2015-10-26 DIAGNOSIS — G8929 Other chronic pain: Secondary | ICD-10-CM | POA: Insufficient documentation

## 2015-10-26 DIAGNOSIS — I10 Essential (primary) hypertension: Secondary | ICD-10-CM | POA: Diagnosis not present

## 2015-10-26 DIAGNOSIS — M545 Low back pain: Secondary | ICD-10-CM | POA: Insufficient documentation

## 2015-10-26 HISTORY — DX: Gastroparesis: K31.84

## 2015-10-26 MED ORDER — PROMETHAZINE HCL 25 MG/ML IJ SOLN
12.5000 mg | Freq: Once | INTRAMUSCULAR | Status: AC
  Administered 2015-10-26: 12.5 mg via INTRAVENOUS
  Filled 2015-10-26: qty 1

## 2015-10-26 MED ORDER — DIPHENHYDRAMINE HCL 50 MG/ML IJ SOLN
12.5000 mg | Freq: Once | INTRAMUSCULAR | Status: AC
  Administered 2015-10-26: 12.5 mg via INTRAVENOUS
  Filled 2015-10-26: qty 1

## 2015-10-26 MED ORDER — HYDROMORPHONE HCL 1 MG/ML IJ SOLN
1.0000 mg | Freq: Once | INTRAMUSCULAR | Status: AC
  Administered 2015-10-26: 1 mg via INTRAVENOUS
  Filled 2015-10-26: qty 1

## 2015-10-26 MED ORDER — HYDROCODONE-ACETAMINOPHEN 7.5-325 MG/15ML PO SOLN
10.0000 mL | Freq: Once | ORAL | Status: DC
  Filled 2015-10-26: qty 15

## 2015-10-26 MED ORDER — HYDROCODONE-ACETAMINOPHEN 7.5-325 MG/15ML PO SOLN
10.0000 mL | Freq: Four times a day (QID) | ORAL | 0 refills | Status: DC | PRN
Start: 2015-10-26 — End: 2016-02-11

## 2015-10-26 MED ORDER — ONDANSETRON HCL 4 MG/2ML IJ SOLN
4.0000 mg | Freq: Once | INTRAMUSCULAR | Status: AC
  Administered 2015-10-26: 4 mg via INTRAVENOUS
  Filled 2015-10-26: qty 2

## 2015-10-26 MED ORDER — DIAZEPAM 5 MG/ML IJ SOLN
2.5000 mg | Freq: Once | INTRAMUSCULAR | Status: AC
  Administered 2015-10-26: 2.5 mg via INTRAVENOUS
  Filled 2015-10-26: qty 2

## 2015-10-26 MED ORDER — SODIUM CHLORIDE 0.9 % IV BOLUS (SEPSIS)
1000.0000 mL | Freq: Once | INTRAVENOUS | Status: AC
  Administered 2015-10-26: 1000 mL via INTRAVENOUS

## 2015-10-26 NOTE — ED Triage Notes (Signed)
Complains of back, neck and sacral pain. States she had injections in back last Thursday. Complains of nausea.

## 2015-10-26 NOTE — Discharge Instructions (Signed)
T  Do not drive within 4 hours of taking lortab as this will make you drowsy.  Avoid lifting,  Bending,  Twisting or any other activity that worsens your pain over the next week.  Call your back specialist tomorrow for a recheck if your symptoms persist.

## 2015-10-26 NOTE — ED Provider Notes (Signed)
[facility] DEPT Provider Note   CSN: [id number] Arrival date & time: 10/26/15  1335     History   Chief Complaint Chief Complaint  Patient presents with  . Back Pain    HPI Brenda Richardson is a 35 y.o. female with a history of lumbar spondylosis presenting with worsened pain in her lower back since she received her second steroid injection 7 days ago by her back specialist with [facility].  She endorses also a history of difficulty with nausea and inability to keep most tablet medications down which she explains by history of gastroparesis.  She denies fevers, chills, localized swelling or redness at the site of her injection.  Additionally she denies any abdominal pain but has had very little po intake since yesterday as her pain worsens the nausea as well.   There is radiation of pain into her legs.  There has been no weakness or numbness in the lower extremities and no urinary or bowel retention or incontinence.  Patient does not have a history of cancer or IVDU.  She is not currently on home medications for pain.   The history is provided by the patient.    Past Medical History:  Diagnosis Date  . Anemia   . Anxiety   . Bipolar disorder ([place])   . Breast infection 04/04/2010   hospitalized, given antibiotics, referred to surgeon  . Chronic pain   . Colonic adenoma   . Complication of anesthesia   . Diverticulitis   . Gastroparesis   . Helicobacter pylori (H. pylori)    s/p treatment  . Hemorrhoids, internal   . Migraine headache   . Obesity   . PONV (postoperative nausea and vomiting)   . Seizures ([place])   . Vaginitis 04/04/2010  . Vitamin B12 deficiency     Patient Active Problem List   Diagnosis Date Noted  . Seizures ([place]) 09/29/2012  . Odynophagia 04/18/2011  . Esophageal dysphagia 04/18/2011  . Cellulitis of breast 05/30/2010  . ANEMIA-UNSPECIFIED 12/11/2009  . HEMATOCHEZIA 12/11/2009  . DYSPHAGIA 12/11/2009  . DIVERTICULITIS, COLON 06/04/2009  .  ABDOMINAL PAIN, GENERALIZED 06/04/2009  . NAUSEA AND VOMITING 05/30/2009  . ABDOMINAL PAIN 05/30/2009  . EPIGASTRIC PAIN 05/30/2009  . PELVIC  PAIN 05/30/2009  . HYPERGLYCEMIA 10/11/2008  . BIPOLAR AFFECTIVE DISORDER 09/15/2008  . PROTEINURIA 04/18/2008  . ANXIETY DEPRESSION 03/28/2008  . CHEST PAIN 03/28/2008  . ESSENTIAL HYPERTENSION, BENIGN 02/12/2008  . LOW BACK PAIN, MILD 02/12/2008  . VENTRICULAR HYPERTROPHY, LEFT 04/10/2007  . COSTOCHONDRITIS, RECURRENT 04/10/2007  . OBESITY NOS 09/19/2006  . MIGRAINE HEADACHE 03/28/2006  . HEMORRHOIDS, INTERNAL 03/28/2006  . GERD 03/28/2006  . CONSTIPATION NOS 03/28/2006    Past Surgical History:  Procedure Laterality Date  . ABDOMINAL HYSTERECTOMY    . APPENDECTOMY  2008   lower abd pain  . BILATERAL SALPINGOOPHORECTOMY  06/2009  . BREAST REDUCTION SURGERY  08/2008   from a 44 DDD to a 44 C  . CHOLECYSTECTOMY    . COLONOSCOPY  01/04/10   tubular adenoma of cecum, normal TI, patchy fibrotic changes involving sigm, desc s/p bx neg. Next TCS 12/2016  . ESOPHAGOGASTRODUODENOSCOPY  01/04/10   normal esophagus s/p dilation, small hh, H pylori gastritis  . ESOPHAGOGASTRODUODENOSCOPY  2008   florid gastroesophageal reflux during exam but normal mucosa  . s/p hysterectomy  08/2007   No Cancer Cells  . TUBAL LIGATION    . TUMOR REMOVAL     from left shoulder  . WISDOM  TOOTH EXTRACTION      OB History    No data available       Home Medications    Prior to Admission medications   Medication Sig Start Date End Date Taking? Authorizing Provider  ALPRAZolam Duanne Moron) 1 MG tablet Take 1 mg by mouth 3 (three) times daily as needed for anxiety.   Yes Historical Provider, MD  eszopiclone (LUNESTA) 2 MG TABS Take 2 mg by mouth at bedtime. Take immediately before bedtime   Yes Historical Provider, MD  HYDROcodone-acetaminophen (HYCET) 7.5-325 mg/15 ml solution Take 10 mLs by mouth every 6 (six) hours as needed for moderate pain. 10/26/15    Evalee Jefferson, PA-C    Family History Family History  Problem Relation Age of Onset  . Cancer Mother     cervical, deceased age 62  . Cancer Father     lung cancer, deceased age 10  . COPD Sister   . CAD Sister   . Heart attack Other   . Anesthesia problems Neg Hx   . Hypotension Neg Hx   . Malignant hyperthermia Neg Hx   . Pseudochol deficiency Neg Hx     Social History Social History  Substance Use Topics  . Smoking status: Never Smoker  . Smokeless tobacco: Never Used  . Alcohol use No     Allergies   Demerol [meperidine]; Prednisone; Tomato; Cephalosporins; Dust mite extract; Ketorolac tromethamine; Lipitor [atorvastatin]; Nalbuphine; Pineapple; Tramadol hcl; Vicodin [hydrocodone-acetaminophen]; Zofran; and Aspirin   Review of Systems Review of Systems  Constitutional: Negative for fever.  Respiratory: Negative for shortness of breath.   Cardiovascular: Negative for chest pain and leg swelling.  Gastrointestinal: Negative for abdominal distention, abdominal pain and constipation.  Genitourinary: Negative for difficulty urinating, dysuria, flank pain, frequency and urgency.  Musculoskeletal: Positive for back pain. Negative for gait problem and joint swelling.  Skin: Negative for rash.  Neurological: Negative for weakness and numbness.     Physical Exam Updated Vital Signs BP 143/76 (BP Location: Left Arm)   Pulse 88   Temp 98.3 F (36.8 C) (Oral)   Resp 18   Ht 5\' 6"  (1.676 m)   Wt 113.4 kg   SpO2 100%   BMI 40.35 kg/m   Physical Exam  Constitutional: She appears well-developed and well-nourished.  HENT:  Head: Normocephalic.  Eyes: Conjunctivae are normal.  Neck: Normal range of motion. Neck supple.  Cardiovascular: Normal rate and intact distal pulses.   Pedal pulses normal.  Pulmonary/Chest: Effort normal.  Abdominal: Soft. Bowel sounds are normal. She exhibits no distension and no mass. There is no tenderness. There is no guarding.    Musculoskeletal: Normal range of motion. She exhibits no edema.       Lumbar back: She exhibits tenderness. She exhibits no swelling, no edema and no spasm.  No erythema or evidence of localized infection.  Neurological: She is alert. She has normal strength. She displays no atrophy and no tremor. No sensory deficit. Gait normal.  Reflex Scores:      Patellar reflexes are 2+ on the right side and 2+ on the left side.      Achilles reflexes are 2+ on the right side and 2+ on the left side. No strength deficit noted in hip and knee flexor and extensor muscle groups.  Ankle flexion and extension intact.  Skin: Skin is warm and dry.  Psychiatric: She has a normal mood and affect.  Nursing note and vitals reviewed.    ED Treatments /  Results  Labs (all labs ordered are listed, but only abnormal results are displayed) Labs Reviewed - No data to display  EKG  EKG Interpretation None       Radiology No results found.  Procedures Procedures (including critical care time)  Medications Ordered in ED Medications  sodium chloride 0.9 % bolus 1,000 mL (0 mLs Intravenous Stopped 10/26/15 1644)  diazepam (VALIUM) injection 2.5 mg (2.5 mg Intravenous Given 10/26/15 1547)  promethazine (PHENERGAN) injection 12.5 mg (12.5 mg Intravenous Given 10/26/15 1544)  HYDROmorphone (DILAUDID) injection 1 mg (1 mg Intravenous Given 10/26/15 1626)  ondansetron (ZOFRAN) injection 4 mg (4 mg Intravenous Given 10/26/15 1714)  diphenhydrAMINE (BENADRYL) injection 12.5 mg (12.5 mg Intravenous Given 10/26/15 1714)  HYDROmorphone (DILAUDID) injection 1 mg (1 mg Intravenous Given 10/26/15 1714)     Initial Impression / Assessment and Plan / ED Course  I have reviewed the triage vital signs and the nursing notes.  Pertinent labs & imaging results that were available during my care of the patient were reviewed by me and considered in my medical decision making (see chart for details).  Clinical Course    Pt  was given IV fluids given lack of PO for the past day along with valium for muscle spasm, phenergan for nausea.  More relaxed, endorses persistent pain and nausea after these meds.  Given zofran with benadryl (to prevent itch SE), dilaudid with improvement in pain.  She was encouraged f/u with her back specialist if her sx persist or worsen.  She was given a trial of oral liquid pain medication for home use prn.    No neuro deficit on exam or by history to suggest emergent or surgical presentation.  Discussed worsened sx that should prompt immediate re-evaluation including distal weakness, bowel/bladder retention/incontinence.   [place] controlled substance database reviewed.   Final Clinical Impressions(s) / ED Diagnoses   Final diagnoses:  Chronic back pain    New Prescriptions Discharge Medication List as of 10/26/2015  5:32 PM    START taking these medications   Details  HYDROcodone-acetaminophen (HYCET) 7.5-325 mg/15 ml solution Take 10 mLs by mouth every 6 (six) hours as needed for moderate pain., Starting Thu 10/26/2015, Print         Evalee Jefferson, PA-C 10/27/15 [place], DO 10/28/15 1539

## 2016-02-11 ENCOUNTER — Emergency Department (HOSPITAL_COMMUNITY)
Admission: EM | Admit: 2016-02-11 | Discharge: 2016-02-11 | Disposition: A | Discharge status: Home / Self Care | Payer: Medicare Other | Attending: Emergency Medicine | Admitting: Emergency Medicine

## 2016-02-11 ENCOUNTER — Emergency Department (HOSPITAL_COMMUNITY): Payer: Medicare Other

## 2016-02-11 ENCOUNTER — Encounter (HOSPITAL_COMMUNITY): Payer: Self-pay | Admitting: Emergency Medicine

## 2016-02-11 DIAGNOSIS — I10 Essential (primary) hypertension: Secondary | ICD-10-CM | POA: Diagnosis not present

## 2016-02-11 DIAGNOSIS — Z79899 Other long term (current) drug therapy: Secondary | ICD-10-CM | POA: Insufficient documentation

## 2016-02-11 DIAGNOSIS — R51 Headache: Secondary | ICD-10-CM | POA: Insufficient documentation

## 2016-02-11 DIAGNOSIS — R42 Dizziness and giddiness: Secondary | ICD-10-CM | POA: Diagnosis not present

## 2016-02-11 DIAGNOSIS — R519 Headache, unspecified: Secondary | ICD-10-CM

## 2016-02-11 LAB — CBC WITH DIFFERENTIAL/PLATELET
Basophils Absolute: 0 10*3/uL (ref 0.0–0.1)
Basophils Relative: 0 %
Eosinophils Absolute: 0.2 10*3/uL (ref 0.0–0.7)
Eosinophils Relative: 2 %
HCT: 38.4 % (ref 36.0–46.0)
Hemoglobin: 12.5 g/dL (ref 12.0–15.0)
Lymphocytes Relative: 35 %
Lymphs Abs: 3.3 10*3/uL (ref 0.7–4.0)
MCH: 26.1 pg (ref 26.0–34.0)
MCHC: 32.6 g/dL (ref 30.0–36.0)
MCV: 80.2 fL (ref 78.0–100.0)
Monocytes Absolute: 0.4 10*3/uL (ref 0.1–1.0)
Monocytes Relative: 4 %
Neutro Abs: 5.6 10*3/uL (ref 1.7–7.7)
Neutrophils Relative %: 59 %
Platelets: 329 10*3/uL (ref 150–400)
RBC: 4.79 MIL/uL (ref 3.87–5.11)
RDW: 13.6 % (ref 11.5–15.5)
WBC: 9.5 10*3/uL (ref 4.0–10.5)

## 2016-02-11 LAB — COMPREHENSIVE METABOLIC PANEL
ALT: 23 U/L (ref 14–54)
AST: 20 U/L (ref 15–41)
Albumin: 3.4 g/dL — ABNORMAL LOW (ref 3.5–5.0)
Alkaline Phosphatase: 89 U/L (ref 38–126)
Anion gap: 7 (ref 5–15)
BUN: 9 mg/dL (ref 6–20)
CO2: 26 mmol/L (ref 22–32)
Calcium: 9 mg/dL (ref 8.9–10.3)
Chloride: 106 mmol/L (ref 101–111)
Creatinine, Ser: 0.77 mg/dL (ref 0.44–1.00)
GFR calc Af Amer: >60 mL/min (ref >60)
GFR calc non Af Amer: >60 mL/min (ref >60)
Glucose, Bld: 125 mg/dL — ABNORMAL HIGH (ref 65–99)
Potassium: 3.8 mmol/L (ref 3.5–5.1)
Sodium: 139 mmol/L (ref 135–145)
Total Bilirubin: 0.5 mg/dL (ref 0.3–1.2)
Total Protein: 7.6 g/dL (ref 6.5–8.1)

## 2016-02-11 LAB — URINALYSIS, ROUTINE W REFLEX MICROSCOPIC
Bilirubin Urine: NEGATIVE
Glucose, UA: NEGATIVE mg/dL
Hgb urine dipstick: NEGATIVE
Ketones, ur: NEGATIVE mg/dL
Leukocytes, UA: NEGATIVE
Nitrite: NEGATIVE
Protein, ur: NEGATIVE mg/dL
Specific Gravity, Urine: <1.005 — ABNORMAL LOW (ref 1.005–1.030)
pH: 7 (ref 5.0–8.0)

## 2016-02-11 MED ORDER — SODIUM CHLORIDE 0.9 % IV BOLUS (SEPSIS)
1000.0000 mL | Freq: Once | INTRAVENOUS | Status: AC
  Administered 2016-02-11: 1000 mL via INTRAVENOUS

## 2016-02-11 MED ORDER — OXYCODONE-ACETAMINOPHEN 5-325 MG PO TABS: 1.0000 | ORAL_TABLET | Freq: Four times a day (QID) | ORAL | 0 refills | Status: DC | PRN

## 2016-02-11 MED ORDER — HYDROMORPHONE HCL 1 MG/ML IJ SOLN
0.5000 mg | Freq: Once | INTRAMUSCULAR | Status: AC
  Administered 2016-02-11: 0.5 mg via INTRAVENOUS
  Filled 2016-02-11: qty 1

## 2016-02-11 MED ORDER — DIPHENHYDRAMINE HCL 50 MG/ML IJ SOLN
25.0000 mg | Freq: Once | INTRAMUSCULAR | Status: AC
  Administered 2016-02-11: 25 mg via INTRAVENOUS
  Filled 2016-02-11: qty 1

## 2016-02-11 MED ORDER — PROCHLORPERAZINE EDISYLATE 5 MG/ML IJ SOLN
10.0000 mg | Freq: Once | INTRAMUSCULAR | Status: AC
Start: 2016-02-11 — End: 2016-02-11
  Administered 2016-02-11: 10 mg via INTRAVENOUS
  Filled 2016-02-11: qty 2

## 2016-02-11 NOTE — ED Notes (Signed)
Took crackers to family memeber

## 2016-02-11 NOTE — ED Triage Notes (Signed)
Pt reports headache since last night. Pt reports having seizure last night, unsure of what time or duration.

## 2016-02-11 NOTE — ED Notes (Signed)
Pt states her headache is unchanged at this time.  Ambulatory to bathroom with standby assist.  Gait steady.

## 2016-02-11 NOTE — Discharge Instructions (Signed)
Follow-up with your family doctor this week for recheck 

## 2016-02-11 NOTE — ED Notes (Signed)
Pt very drowsy, but states her pain is only a little better.

## 2016-02-11 NOTE — ED Provider Notes (Signed)
[facility] DEPT Provider Note   CSN: [id number] Arrival date & time: 02/11/16  1008  By signing my name below, I, Avnee Patel, attest that this documentation has been prepared under the direction and in the presence of Milton Ferguson, MD  Electronically Signed: [place], ED Scribe. 02/11/16. 10:44 AM.   History   Chief Complaint Chief Complaint  Patient presents with  . Headache    The history is provided by the patient. No language interpreter was used.  Headache   This is a new problem. The current episode started yesterday. The problem occurs constantly. The problem has not changed since onset.The pain is located in the left unilateral region. The pain is moderate. She has tried nothing for the symptoms.   HPI Comments:  Brenda Richardson is a 35 y.o. female, with a hx of seizures (last seizure x 4 months), who presents to the Emergency Department complaining of sudden onset, moderate left sided headache x yesterday. Pt states she had a seizure last night. Relative states he witnessed the seizure and notes the pt had balled her fist during the episdoe. Pt notes associated dizziness and confusion and relative notes pt is off-balance. Pt used to take Lamictal but was taken off this medication because her neurologist told her Botox treatments would help her seizures. Pt states the Botox helped her headaches but she still has seizures. She also reports she normally has frontal headaches and states she has never experienced a similar headache as today. No alleviating factors noted. Pt denies any other associated symptoms and modifying factors at this time.   Past Medical History:  Diagnosis Date  . Anemia   . Anxiety   . Bipolar disorder ([place])   . Breast infection 04/04/2010   hospitalized, given antibiotics, referred to surgeon  . Chronic pain   . Colonic adenoma   . Complication of anesthesia   . Diverticulitis   . Gastroparesis   . Helicobacter pylori (H. pylori)    s/p  treatment  . Hemorrhoids, internal   . Migraine headache   . Obesity   . PONV (postoperative nausea and vomiting)   . Seizures ([place])   . Vaginitis 04/04/2010  . Vitamin B12 deficiency     Patient Active Problem List   Diagnosis Date Noted  . Seizures ([place]) 09/29/2012  . Odynophagia 04/18/2011  . Esophageal dysphagia 04/18/2011  . Cellulitis of breast 05/30/2010  . ANEMIA-UNSPECIFIED 12/11/2009  . HEMATOCHEZIA 12/11/2009  . DYSPHAGIA 12/11/2009  . DIVERTICULITIS, COLON 06/04/2009  . ABDOMINAL PAIN, GENERALIZED 06/04/2009  . NAUSEA AND VOMITING 05/30/2009  . ABDOMINAL PAIN 05/30/2009  . EPIGASTRIC PAIN 05/30/2009  . PELVIC  PAIN 05/30/2009  . HYPERGLYCEMIA 10/11/2008  . BIPOLAR AFFECTIVE DISORDER 09/15/2008  . PROTEINURIA 04/18/2008  . ANXIETY DEPRESSION 03/28/2008  . CHEST PAIN 03/28/2008  . ESSENTIAL HYPERTENSION, BENIGN 02/12/2008  . LOW BACK PAIN, MILD 02/12/2008  . VENTRICULAR HYPERTROPHY, LEFT 04/10/2007  . COSTOCHONDRITIS, RECURRENT 04/10/2007  . OBESITY NOS 09/19/2006  . MIGRAINE HEADACHE 03/28/2006  . HEMORRHOIDS, INTERNAL 03/28/2006  . GERD 03/28/2006  . CONSTIPATION NOS 03/28/2006    Past Surgical History:  Procedure Laterality Date  . ABDOMINAL HYSTERECTOMY    . APPENDECTOMY  2008   lower abd pain  . BILATERAL SALPINGOOPHORECTOMY  06/2009  . BREAST REDUCTION SURGERY  08/2008   from a 44 DDD to a 44 C  . CHOLECYSTECTOMY    . COLONOSCOPY  01/04/10   tubular adenoma of cecum, normal TI, patchy fibrotic changes involving  sigm, desc s/p bx neg. Next TCS 12/2016  . ESOPHAGOGASTRODUODENOSCOPY  01/04/10   normal esophagus s/p dilation, small hh, H pylori gastritis  . ESOPHAGOGASTRODUODENOSCOPY  2008   florid gastroesophageal reflux during exam but normal mucosa  . s/p hysterectomy  08/2007   No Cancer Cells  . TUBAL LIGATION    . TUMOR REMOVAL     from left shoulder  . WISDOM TOOTH EXTRACTION      OB History    No data available       Home  Medications    Prior to Admission medications   Medication Sig Start Date End Date Taking? Authorizing Provider  ALPRAZolam Duanne Moron) 1 MG tablet Take 1 mg by mouth 3 (three) times daily as needed for anxiety.    Historical Provider, MD  eszopiclone (LUNESTA) 2 MG TABS Take 2 mg by mouth at bedtime. Take immediately before bedtime    Historical Provider, MD  HYDROcodone-acetaminophen (HYCET) 7.5-325 mg/15 ml solution Take 10 mLs by mouth every 6 (six) hours as needed for moderate pain. 10/26/15   Evalee Jefferson, PA-C    Family History Family History  Problem Relation Age of Onset  . Cancer Mother     cervical, deceased age 74  . Cancer Father     lung cancer, deceased age 73  . COPD Sister   . CAD Sister   . Heart attack Other   . Anesthesia problems Neg Hx   . Hypotension Neg Hx   . Malignant hyperthermia Neg Hx   . Pseudochol deficiency Neg Hx     Social History Social History  Substance Use Topics  . Smoking status: Never Smoker  . Smokeless tobacco: Never Used  . Alcohol use No     Allergies   Demerol [meperidine]; Prednisone; Tomato; Cephalosporins; Dust mite extract; Ketorolac tromethamine; Lipitor [atorvastatin]; Nalbuphine; Pineapple; Tramadol hcl; Vicodin [hydrocodone-acetaminophen]; Zofran; and Aspirin   Review of Systems Review of Systems  Constitutional: Negative for appetite change and fatigue.  HENT: Negative for congestion, ear discharge and sinus pressure.   Eyes: Negative for discharge.  Respiratory: Negative for cough.   Cardiovascular: Negative for chest pain.  Gastrointestinal: Negative for abdominal pain and diarrhea.  Genitourinary: Negative for frequency and hematuria.  Musculoskeletal: Negative for back pain.  Skin: Negative for rash.  Neurological: Positive for dizziness and headaches.  Psychiatric/Behavioral: Negative for hallucinations.     Physical Exam Updated Vital Signs BP 135/88 (BP Location: Right Arm)   Pulse 80   Temp 97.8 F (36.6  C) (Oral)   Resp 21   Ht 5\' 6"  (1.676 m)   Wt 240 lb (108.9 kg)   SpO2 98%   BMI 38.74 kg/m   Physical Exam  Constitutional: She is oriented to person, place, and time. She appears well-developed.  HENT:  Head: Normocephalic.  Eyes: Conjunctivae and EOM are normal. No scleral icterus.  Neck: Neck supple. No thyromegaly present.  Cardiovascular: Normal rate and regular rhythm.  Exam reveals no gallop and no friction rub.   No murmur heard. Pulmonary/Chest: No stridor. She has no wheezes. She has no rales. She exhibits no tenderness.  Abdominal: She exhibits no distension. There is no tenderness. There is no rebound.  Musculoskeletal: Normal range of motion. She exhibits no edema.  Lymphadenopathy:    She has no cervical adenopathy.  Neurological: She is oriented to person, place, and time. She exhibits normal muscle tone. Coordination normal.  Skin: No rash noted. No erythema.  Psychiatric: She has a normal  mood and affect. Her behavior is normal.  Nursing note and vitals reviewed.    ED Treatments / Results  DIAGNOSTIC STUDIES:  Oxygen Saturation is 98% on RA, normal by my interpretation.    COORDINATION OF CARE:  10:36 AM Discussed treatment plan with pt at bedside and pt agreed to plan.  Labs (all labs ordered are listed, but only abnormal results are displayed) Labs Reviewed - No data to display  EKG  EKG Interpretation None       Radiology No results found.  Procedures Procedures (including critical care time)  Medications Ordered in ED Medications - No data to display   Initial Impression / Assessment and Plan / ED Course  I have reviewed the triage vital signs and the nursing notes.  Pertinent labs & imaging results that were available during my care of the patient were reviewed by me and considered in my medical decision making (see chart for details).  Clinical Course     headache  Final Clinical Impressions(s) / ED Diagnoses   Final  diagnoses:  None    New Prescriptions New Prescriptions   No medications on file  The chart was scribed for me under my direct supervision.  I personally performed the history, physical, and medical decision making and all procedures in the evaluation of this patient.Milton Ferguson, MD 02/12/16 [phone]

## 2016-02-11 NOTE — ED Notes (Signed)
Pt very drowsy, but states headache is no better.

## 2016-04-20 ENCOUNTER — Encounter (HOSPITAL_COMMUNITY): Payer: Self-pay | Admitting: Emergency Medicine

## 2016-04-20 DIAGNOSIS — R51 Headache: Secondary | ICD-10-CM | POA: Diagnosis not present

## 2016-04-20 DIAGNOSIS — Z79899 Other long term (current) drug therapy: Secondary | ICD-10-CM | POA: Insufficient documentation

## 2016-04-20 DIAGNOSIS — R531 Weakness: Secondary | ICD-10-CM | POA: Insufficient documentation

## 2016-04-20 DIAGNOSIS — F410 Panic disorder [episodic paroxysmal anxiety] without agoraphobia: Secondary | ICD-10-CM | POA: Insufficient documentation

## 2016-04-20 DIAGNOSIS — R200 Anesthesia of skin: Secondary | ICD-10-CM | POA: Insufficient documentation

## 2016-04-20 DIAGNOSIS — I10 Essential (primary) hypertension: Secondary | ICD-10-CM | POA: Insufficient documentation

## 2016-04-20 NOTE — ED Triage Notes (Signed)
Pt states that she had a panic attack this morning.  Since she has had high blood pressure, headache, numbness in left hand, and weakness

## 2016-04-21 ENCOUNTER — Emergency Department (HOSPITAL_COMMUNITY)
Admission: EM | Admit: 2016-04-21 | Discharge: 2016-04-21 | Disposition: A | Discharge status: Home / Self Care | Payer: Medicare Other | Attending: Dermatology | Admitting: Dermatology

## 2016-04-21 NOTE — ED Notes (Signed)
Called pt back into triage to recheck pt's blood pressure, vitals taken per request, delay explained to pt, pt upset with waiting, delay explained to pt, pt states that she is not sure if she is staying or not,

## 2016-04-21 NOTE — ED Notes (Signed)
No answer in waiting room X2,  

## 2016-04-21 NOTE — ED Notes (Signed)
Not in waiting area x 3

## 2016-04-21 NOTE — ED Notes (Signed)
No answer in waiting room X1 

## 2016-06-15 ENCOUNTER — Emergency Department (HOSPITAL_COMMUNITY)
Admission: EM | Admit: 2016-06-15 | Discharge: 2016-06-15 | Disposition: A | Discharge status: Home / Self Care | Payer: Medicare Other | Attending: Emergency Medicine | Admitting: Emergency Medicine

## 2016-06-15 ENCOUNTER — Encounter (HOSPITAL_COMMUNITY): Payer: Self-pay | Admitting: *Deleted

## 2016-06-15 DIAGNOSIS — I10 Essential (primary) hypertension: Secondary | ICD-10-CM | POA: Insufficient documentation

## 2016-06-15 DIAGNOSIS — M79605 Pain in left leg: Secondary | ICD-10-CM | POA: Diagnosis not present

## 2016-06-15 MED ORDER — ENOXAPARIN SODIUM 120 MG/0.8ML ~~LOC~~ SOLN
1.0000 mg/kg | Freq: Once | SUBCUTANEOUS | Status: AC
  Administered 2016-06-15: 110 mg via SUBCUTANEOUS
  Filled 2016-06-15: qty 0.8

## 2016-06-15 NOTE — Discharge Instructions (Signed)
You will need an ultrasound of your left leg tomorrow. This will look for a blood clot. RN will give you time to return.

## 2016-06-15 NOTE — ED Provider Notes (Signed)
[facility] DEPT Provider Note   CSN: [id number] Arrival date & time: 06/15/16  1903     History   Chief Complaint Chief Complaint  Patient presents with  . Leg Pain    HPI Brenda Richardson is a 36 y.o. female.  Patient is concerned about pain in her left leg and the possibility of a blood clot. No prolonged travel, immobilization, recent surgery, cancer diagnosis. No previous history of DVT. No chest pain or dyspnea. O trauma to the area.      Past Medical History:  Diagnosis Date  . Anemia   . Anxiety   . Bipolar disorder ([place])   . Breast infection 04/04/2010   hospitalized, given antibiotics, referred to surgeon  . Chronic pain   . Colonic adenoma   . Complication of anesthesia   . Diverticulitis   . Gastroparesis   . Helicobacter pylori (H. pylori)    s/p treatment  . Hemorrhoids, internal   . Migraine headache   . Obesity   . PONV (postoperative nausea and vomiting)   . Seizures ([place])   . Vaginitis 04/04/2010  . Vitamin B12 deficiency     Patient Active Problem List   Diagnosis Date Noted  . Seizures ([place]) 09/29/2012  . Odynophagia 04/18/2011  . Esophageal dysphagia 04/18/2011  . Cellulitis of breast 05/30/2010  . ANEMIA-UNSPECIFIED 12/11/2009  . HEMATOCHEZIA 12/11/2009  . DYSPHAGIA 12/11/2009  . DIVERTICULITIS, COLON 06/04/2009  . ABDOMINAL PAIN, GENERALIZED 06/04/2009  . NAUSEA AND VOMITING 05/30/2009  . ABDOMINAL PAIN 05/30/2009  . EPIGASTRIC PAIN 05/30/2009  . PELVIC  PAIN 05/30/2009  . HYPERGLYCEMIA 10/11/2008  . BIPOLAR AFFECTIVE DISORDER 09/15/2008  . PROTEINURIA 04/18/2008  . ANXIETY DEPRESSION 03/28/2008  . CHEST PAIN 03/28/2008  . ESSENTIAL HYPERTENSION, BENIGN 02/12/2008  . LOW BACK PAIN, MILD 02/12/2008  . VENTRICULAR HYPERTROPHY, LEFT 04/10/2007  . COSTOCHONDRITIS, RECURRENT 04/10/2007  . OBESITY NOS 09/19/2006  . MIGRAINE HEADACHE 03/28/2006  . HEMORRHOIDS, INTERNAL 03/28/2006  . GERD 03/28/2006  . CONSTIPATION NOS  03/28/2006    Past Surgical History:  Procedure Laterality Date  . ABDOMINAL HYSTERECTOMY    . APPENDECTOMY  2008   lower abd pain  . BILATERAL SALPINGOOPHORECTOMY  06/2009  . BREAST REDUCTION SURGERY  08/2008   from a 44 DDD to a 44 C  . CHOLECYSTECTOMY    . COLONOSCOPY  01/04/10   tubular adenoma of cecum, normal TI, patchy fibrotic changes involving sigm, desc s/p bx neg. Next TCS 12/2016  . ESOPHAGOGASTRODUODENOSCOPY  01/04/10   normal esophagus s/p dilation, small hh, H pylori gastritis  . ESOPHAGOGASTRODUODENOSCOPY  2008   florid gastroesophageal reflux during exam but normal mucosa  . s/p hysterectomy  08/2007   No Cancer Cells  . TUBAL LIGATION    . TUMOR REMOVAL     from left shoulder  . WISDOM TOOTH EXTRACTION      OB History    No data available       Home Medications    Prior to Admission medications   Medication Sig Start Date End Date Taking? Authorizing Provider  ALPRAZolam (XANAX) 0.25 MG tablet Take 0.25 mg by mouth 3 (three) times daily as needed for anxiety.    Historical Provider, MD  oxyCODONE-acetaminophen (PERCOCET/ROXICET) 5-325 MG tablet Take 1 tablet by mouth every 6 (six) hours as needed. 02/11/16   Milton Ferguson, MD  zolpidem (AMBIEN) 10 MG tablet Take 1 tablet by mouth at bedtime. 01/14/16   Historical Provider, MD    Family History  Family History  Problem Relation Age of Onset  . Cancer Mother     cervical, deceased age 72  . Cancer Father     lung cancer, deceased age 20  . COPD Sister   . CAD Sister   . Heart attack Other   . Anesthesia problems Neg Hx   . Hypotension Neg Hx   . Malignant hyperthermia Neg Hx   . Pseudochol deficiency Neg Hx     Social History Social History  Substance Use Topics  . Smoking status: Never Smoker  . Smokeless tobacco: Never Used  . Alcohol use No     Allergies   Demerol [meperidine]; Prednisone; Tomato; Cephalosporins; Dust mite extract; Ketorolac tromethamine; Lipitor [atorvastatin];  Nalbuphine; Pineapple; Tramadol hcl; Vicodin [hydrocodone-acetaminophen]; Zofran; and Aspirin   Review of Systems Review of Systems  All other systems reviewed and are negative.    Physical Exam Updated Vital Signs BP 128/86 (BP Location: Right Arm)   Pulse 74   Temp 98.2 F (36.8 C) (Oral)   Resp 18   Ht 5\' 6"  (1.676 m)   Wt 243 lb (110.2 kg)   SpO2 100%   BMI 39.22 kg/m   Physical Exam  Constitutional: She is oriented to person, place, and time. She appears well-developed and well-nourished.  HENT:  Head: Normocephalic and atraumatic.  Eyes: Conjunctivae are normal.  Neck: Neck supple.  Cardiovascular: Normal rate and regular rhythm.   Pulmonary/Chest: Effort normal and breath sounds normal.  Abdominal: Soft. Bowel sounds are normal.  Musculoskeletal:  Patient is tender to palpation in the distal medial posterior thigh area  Neurological: She is alert and oriented to person, place, and time.  Skin: Skin is warm and dry.  Psychiatric: She has a normal mood and affect. Her behavior is normal.  Nursing note and vitals reviewed.    ED Treatments / Results  Labs (all labs ordered are listed, but only abnormal results are displayed) Labs Reviewed - No data to display  EKG  EKG Interpretation None       Radiology No results found.  Procedures Procedures (including critical care time)  Medications Ordered in ED Medications  enoxaparin (LOVENOX) injection 110 mg (110 mg Subcutaneous Given 06/15/16 1953)     Initial Impression / Assessment and Plan / ED Course  I have reviewed the triage vital signs and the nursing notes.  Pertinent labs & imaging results that were available during my care of the patient were reviewed by me and considered in my medical decision making (see chart for details).     Patient is stable. Will Rx Lovenox 1 mg/kg subcutaneous and patient will return tomorrow morning for an ultrasound of her leg to rule out a DVT.  Final Clinical  Impressions(s) / ED Diagnoses   Final diagnoses:  Left leg pain    New Prescriptions Discharge Medication List as of 06/15/2016  8:23 PM       Nat Christen, MD 06/15/16 2134

## 2016-06-15 NOTE — ED Triage Notes (Signed)
Pt reports being seen at [facility] about 3 hours ago. Pt reports she was told that her d-dimer was elevated. Pt states she left AMA without getting prescriptions or getting a "shot" for pain. Pt states she woke up in pain in her left medial distal thigh. Pt reports pain in the leg.

## 2016-06-16 ENCOUNTER — Ambulatory Visit (HOSPITAL_COMMUNITY)
Admit: 2016-06-16 | Discharge: 2016-06-16 | Disposition: A | Discharge status: Home / Self Care | Payer: Medicare Other | Attending: Emergency Medicine | Admitting: Emergency Medicine

## 2016-06-16 DIAGNOSIS — M79652 Pain in left thigh: Secondary | ICD-10-CM | POA: Insufficient documentation

## 2016-12-15 ENCOUNTER — Emergency Department (HOSPITAL_COMMUNITY): Payer: Medicare Other

## 2016-12-15 ENCOUNTER — Emergency Department (HOSPITAL_COMMUNITY)
Admission: EM | Admit: 2016-12-15 | Discharge: 2016-12-15 | Disposition: A | Discharge status: Home / Self Care | Payer: Medicare Other | Attending: Emergency Medicine | Admitting: Emergency Medicine

## 2016-12-15 ENCOUNTER — Encounter (HOSPITAL_COMMUNITY): Payer: Self-pay | Admitting: Emergency Medicine

## 2016-12-15 DIAGNOSIS — M79662 Pain in left lower leg: Secondary | ICD-10-CM | POA: Insufficient documentation

## 2016-12-15 DIAGNOSIS — R0789 Other chest pain: Secondary | ICD-10-CM | POA: Diagnosis present

## 2016-12-15 DIAGNOSIS — R0600 Dyspnea, unspecified: Secondary | ICD-10-CM | POA: Insufficient documentation

## 2016-12-15 DIAGNOSIS — M7989 Other specified soft tissue disorders: Secondary | ICD-10-CM | POA: Insufficient documentation

## 2016-12-15 DIAGNOSIS — I10 Essential (primary) hypertension: Secondary | ICD-10-CM | POA: Diagnosis not present

## 2016-12-15 DIAGNOSIS — R079 Chest pain, unspecified: Secondary | ICD-10-CM

## 2016-12-15 DIAGNOSIS — M79605 Pain in left leg: Secondary | ICD-10-CM

## 2016-12-15 HISTORY — DX: Essential (primary) hypertension: I10

## 2016-12-15 LAB — CBC
HCT: 37.7 % (ref 36.0–46.0)
Hemoglobin: 12 g/dL (ref 12.0–15.0)
MCH: 25.6 pg — ABNORMAL LOW (ref 26.0–34.0)
MCHC: 31.8 g/dL (ref 30.0–36.0)
MCV: 80.6 fL (ref 78.0–100.0)
Platelets: 290 10*3/uL (ref 150–400)
RBC: 4.68 MIL/uL (ref 3.87–5.11)
RDW: 13.9 % (ref 11.5–15.5)
WBC: 10.6 10*3/uL — ABNORMAL HIGH (ref 4.0–10.5)

## 2016-12-15 LAB — BASIC METABOLIC PANEL
Anion gap: 8 (ref 5–15)
BUN: 11 mg/dL (ref 6–20)
CO2: 26 mmol/L (ref 22–32)
Calcium: 8.8 mg/dL — ABNORMAL LOW (ref 8.9–10.3)
Chloride: 106 mmol/L (ref 101–111)
Creatinine, Ser: 0.81 mg/dL (ref 0.44–1.00)
GFR calc Af Amer: >60 mL/min (ref >60)
GFR calc non Af Amer: >60 mL/min (ref >60)
Glucose, Bld: 186 mg/dL — ABNORMAL HIGH (ref 65–99)
Potassium: 3.4 mmol/L — ABNORMAL LOW (ref 3.5–5.1)
Sodium: 140 mmol/L (ref 135–145)

## 2016-12-15 LAB — TROPONIN I: Troponin I: <0.03 ng/mL (ref <0.03)

## 2016-12-15 MED ORDER — FENTANYL CITRATE (PF) 100 MCG/2ML IJ SOLN
50.0000 ug | Freq: Once | INTRAMUSCULAR | Status: AC
  Administered 2016-12-15: 50 ug via INTRAVENOUS
  Filled 2016-12-15: qty 2

## 2016-12-15 MED ORDER — DIPHENHYDRAMINE HCL 25 MG PO CAPS
25.0000 mg | ORAL_CAPSULE | Freq: Four times a day (QID) | ORAL | Status: DC | PRN
  Filled 2016-12-15: qty 1

## 2016-12-15 MED ORDER — KETOROLAC TROMETHAMINE 30 MG/ML IJ SOLN
30.0000 mg | Freq: Once | INTRAMUSCULAR | Status: AC
  Administered 2016-12-15: 30 mg via INTRAVENOUS
  Filled 2016-12-15: qty 1

## 2016-12-15 MED ORDER — IBUPROFEN 800 MG PO TABS
800.0000 mg | ORAL_TABLET | Freq: Once | ORAL | Status: DC
  Filled 2016-12-15: qty 1

## 2016-12-15 MED ORDER — IOPAMIDOL (ISOVUE-370) INJECTION 76%
100.0000 mL | Freq: Once | INTRAVENOUS | Status: AC | PRN
  Administered 2016-12-15: 100 mL via INTRAVENOUS

## 2016-12-15 NOTE — ED Provider Notes (Signed)
Emergency Department Provider Note   I have reviewed the triage vital signs and the nursing notes.   HISTORY  Chief Complaint No chief complaint on file.   HPI Brenda Richardson is a 36 y.o. female with PMH of Bipolar disorder, Gastroparesis, and chronic back pain presents to the emergency room in for evaluation of chest pain in the setting of left leg swelling. Patient states she's has a past history of left leg swelling intermittently. She states the leg will typically swell and then the swelling will decrease over several days. This time she notes leg swelling that has not decreased and has been present for the past 6 days. Yesterday she developed some central chest pressure. She describes it as occurring intermittently and nonradiating. She denies any pleuritic or exertional component to the pain. No other modifying factors. She does note some dyspnea when she is having pain. No fever, chills, productive cough, or leg rash.    Past Medical History:  Diagnosis Date  . Anemia   . Anxiety   . Bipolar disorder ([place])   . Breast infection 04/04/2010   hospitalized, given antibiotics, referred to surgeon  . Chronic pain   . Colonic adenoma   . Complication of anesthesia   . Diverticulitis   . Gastroparesis   . Helicobacter pylori (H. pylori)    s/p treatment  . Hemorrhoids, internal   . Hypertension   . Migraine headache   . Obesity   . PONV (postoperative nausea and vomiting)   . Seizures ([place])   . Vaginitis 04/04/2010  . Vitamin B12 deficiency     Patient Active Problem List   Diagnosis Date Noted  . Seizures ([place]) 09/29/2012  . Odynophagia 04/18/2011  . Esophageal dysphagia 04/18/2011  . Cellulitis of breast 05/30/2010  . ANEMIA-UNSPECIFIED 12/11/2009  . HEMATOCHEZIA 12/11/2009  . DYSPHAGIA 12/11/2009  . DIVERTICULITIS, COLON 06/04/2009  . ABDOMINAL PAIN, GENERALIZED 06/04/2009  . NAUSEA AND VOMITING 05/30/2009  . ABDOMINAL PAIN 05/30/2009  . EPIGASTRIC PAIN  05/30/2009  . PELVIC  PAIN 05/30/2009  . HYPERGLYCEMIA 10/11/2008  . BIPOLAR AFFECTIVE DISORDER 09/15/2008  . PROTEINURIA 04/18/2008  . ANXIETY DEPRESSION 03/28/2008  . CHEST PAIN 03/28/2008  . ESSENTIAL HYPERTENSION, BENIGN 02/12/2008  . LOW BACK PAIN, MILD 02/12/2008  . VENTRICULAR HYPERTROPHY, LEFT 04/10/2007  . COSTOCHONDRITIS, RECURRENT 04/10/2007  . OBESITY NOS 09/19/2006  . MIGRAINE HEADACHE 03/28/2006  . HEMORRHOIDS, INTERNAL 03/28/2006  . GERD 03/28/2006  . CONSTIPATION NOS 03/28/2006    Past Surgical History:  Procedure Laterality Date  . ABDOMINAL HYSTERECTOMY    . APPENDECTOMY  2008   lower abd pain  . BILATERAL SALPINGOOPHORECTOMY  06/2009  . BREAST REDUCTION SURGERY  08/2008   from a 44 DDD to a 44 C  . CHOLECYSTECTOMY    . COLONOSCOPY  01/04/10   tubular adenoma of cecum, normal TI, patchy fibrotic changes involving sigm, desc s/p bx neg. Next TCS 12/2016  . ESOPHAGOGASTRODUODENOSCOPY  01/04/10   normal esophagus s/p dilation, small hh, H pylori gastritis  . ESOPHAGOGASTRODUODENOSCOPY  2008   florid gastroesophageal reflux during exam but normal mucosa  . s/p hysterectomy  08/2007   No Cancer Cells  . TUBAL LIGATION    . TUMOR REMOVAL     from left shoulder  . WISDOM TOOTH EXTRACTION      Current Outpatient Rx  . Order #: [id number] Class: Historical Med  . Order #: [id number] Class: Print  . Order #: [id number] Class: Historical Med    Allergies Demerol [  meperidine]; Prednisone; Tomato; Cephalosporins; Dust mite extract; Ketorolac tromethamine; Lipitor [atorvastatin]; Nalbuphine; Pineapple; Tramadol hcl; Vicodin [hydrocodone-acetaminophen]; Zofran; and Aspirin  Family History  Problem Relation Age of Onset  . Cancer Mother        cervical, deceased age 70  . Cancer Father        lung cancer, deceased age 83  . COPD Sister   . CAD Sister   . Heart attack Other   . Anesthesia problems Neg Hx   . Hypotension Neg Hx   . Malignant hyperthermia Neg Hx     . Pseudochol deficiency Neg Hx     Social History Social History  Substance Use Topics  . Smoking status: Never Smoker  . Smokeless tobacco: Never Used  . Alcohol use No    Review of Systems  Constitutional: No fever/chills Eyes: No visual changes. ENT: No sore throat. Cardiovascular: Positive chest pain. Respiratory: Positive shortness of breath. Gastrointestinal: No abdominal pain.  No nausea, no vomiting.  No diarrhea.  No constipation. Genitourinary: Negative for dysuria. Musculoskeletal: Negative for back pain. Positive left leg swelling and pain.  Skin: Negative for rash. Neurological: Negative for headaches, focal weakness or numbness.  10-point ROS otherwise negative.  ____________________________________________   PHYSICAL EXAM:  VITAL SIGNS: ED Triage Vitals  Enc Vitals Group     BP 12/15/16 1748 116/81     Pulse Rate 12/15/16 1748 90     Resp 12/15/16 1748 16     Temp 12/15/16 1748 98.7 F (37.1 C)     Temp Source 12/15/16 1748 Oral     SpO2 12/15/16 1748 96 %     Weight 12/15/16 1748 243 lb (110.2 kg)     Pain Score 12/15/16 1750 7   Constitutional: Alert and oriented. Well appearing and in no acute distress. Eyes: Conjunctivae are normal. Head: Atraumatic. Nose: No congestion/rhinnorhea. Mouth/Throat: Mucous membranes are moist. Neck: No stridor.  Cardiovascular: Normal rate, regular rhythm. Good peripheral circulation. Grossly normal heart sounds.   Respiratory: Normal respiratory effort.  No retractions. Lungs CTAB. Gastrointestinal: Soft and nontender. No distention.   Musculoskeletal: No lower extremity tenderness. 2+ edema in the LLE with tenderness to palpation of the calf. No gross deformities of extremities. Neurologic:  Normal speech and language. No gross focal neurologic deficits are appreciated.  Skin:  Skin is warm, dry and intact. No rash noted.  ____________________________________________   LABS (all labs ordered are listed,  but only abnormal results are displayed)  Labs Reviewed  BASIC METABOLIC PANEL - Abnormal; Notable for the following:       Result Value   Potassium 3.4 (*)    Glucose, Bld 186 (*)    Calcium 8.8 (*)    All other components within normal limits  CBC - Abnormal; Notable for the following:    WBC 10.6 (*)    MCH 25.6 (*)    All other components within normal limits  TROPONIN I   ____________________________________________  EKG   EKG Interpretation  Date/Time:  Sunday December 15 2016 17:52:40 EDT Ventricular Rate:  88 PR Interval:  160 QRS Duration: 84 QT Interval:  346 QTC Calculation: 418 R Axis:   13 Text Interpretation:  Normal sinus rhythm Possible Left atrial enlargement Nonspecific T wave abnormality Abnormal ECG No STEMI. Similar to prior.  Confirmed by Nanda Quinton [phone]) on 12/15/2016 5:55:38 PM       ____________________________________________  RADIOLOGY  Dg Chest 2 View  Result Date: 12/15/2016 CLINICAL DATA:  Pleuritic midchest pain  and left lower extremity swelling, onset today. EXAM: CHEST  2 VIEW COMPARISON:  04/17/2016 FINDINGS: The lungs are clear. The pulmonary vasculature is normal. Heart size is normal. Hilar and mediastinal contours are unremarkable. There is no pleural effusion. IMPRESSION: No active cardiopulmonary disease. Electronically Signed   By: Andreas [place] M.D.   On: 12/15/2016 18:59   Ct Angio Chest Pe W And/or Wo Contrast  Result Date: 12/15/2016 CLINICAL DATA:  Mid chest pain and pressure. Left lower extremity swelling. EXAM: CT ANGIOGRAPHY CHEST WITH CONTRAST TECHNIQUE: Multidetector CT imaging of the chest was performed using the standard protocol during bolus administration of intravenous contrast. Multiplanar CT image reconstructions and MIPs were obtained to evaluate the vascular anatomy. CONTRAST:  100 mL Isovue 370 COMPARISON:  Two-view chest x-ray from the same day. FINDINGS: Cardiovascular: Heart size is normal. The aortic  arch and great vessel origins are normal. Pulmonary artery opacification is satisfactory. There is no focal filling defect to suggest pulmonary embolus. Mediastinum/Nodes: No significant mediastinal or axillary adenopathy is present. Lungs/Pleura: The lungs are clear without focal nodule, mass, or airspace disease. No significant pleural effusion is present. Upper Abdomen: Cholecystectomy is noted. There is probable fatty infiltration of the liver. No focal lesions are evident. Musculoskeletal: Vertebral body heights alignment are maintained. No focal lytic or blastic lesions are present. Review of the MIP images confirms the above findings. IMPRESSION: 1. No pulmonary embolus. 2. No acute or focal abnormality to explain the patient's chest pain or pressure. 3. Hepatic steatosis. Electronically Signed   By: San Morelle M.D.   On: 12/15/2016 21:02    ____________________________________________   PROCEDURES  Procedure(s) performed:   Procedures  None ____________________________________________   INITIAL IMPRESSION / ASSESSMENT AND PLAN / ED COURSE  Pertinent labs & imaging results that were available during my care of the patient were reviewed by me and considered in my medical decision making (see chart for details).  Patient resents to the emergency department for evaluation of central chest pressure with mild dyspnea in the setting of left leg swelling. Leg swelling occurred prior to chest pain. No cellulitis in the left leg. My suspicion for PE is elevated in this case. She denies any recent surgery or prolonged immobility but has had intermittent left leg swelling. Vital signs are normal. Plan for CXR, labs, and plan for CTA to r/o PE.   CTA negative for PE. Patient to return in the AM for LLE [place] for evaluate for DVT. Order placed at discharge and instructions for f/u regarding that scan were reviewed.   At this time, I do not feel there is any life-threatening condition  present. I have reviewed and discussed all results (EKG, imaging, lab, urine as appropriate), exam findings with patient. I have reviewed nursing notes and appropriate previous records.  I feel the patient is safe to be discharged home without further emergent workup. Discussed usual and customary return precautions. Patient and family (if present) verbalize understanding and are comfortable with this plan.  Patient will follow-up with their primary care provider. If they do not have a primary care provider, information for follow-up has been provided to them. All questions have been answered.  ____________________________________________  FINAL CLINICAL IMPRESSION(S) / ED DIAGNOSES  Final diagnoses:  Chest pain, unspecified type  Pain and swelling of left lower leg     MEDICATIONS GIVEN DURING THIS VISIT:  Medications  ketorolac (TORADOL) 30 MG/ML injection 30 mg (30 mg Intravenous Given 12/15/16 1905)  fentaNYL (SUBLIMAZE) injection 50  mcg (50 mcg Intravenous Given 12/15/16 1955)  iopamidol (ISOVUE-370) 76 % injection 100 mL (100 mLs Intravenous Contrast Given 12/15/16 2021)     NEW OUTPATIENT MEDICATIONS STARTED DURING THIS VISIT:  None   Note:  This document was prepared using Dragon voice recognition software and may include unintentional dictation errors.  Nanda Quinton, MD Emergency Medicine    Fern Asmar, [facility], MD 12/15/16 [phone]

## 2016-12-15 NOTE — ED Notes (Signed)
Pt requesting more pain meds and ice water.Notified Dr. Laverta Baltimore and he is in with pt.

## 2016-12-15 NOTE — Discharge Instructions (Signed)
You were seen in the ED today with left leg swelling and chest pain. The CT scan of the chest was normal. I have provided a phone number to call in the morning and placed an order for an ultrasound to be performed tomorrow to evaluate your left leg for blood clot. You will return to this hospital tomorrow morning for the ultrasound.   IMPORTANT PATIENT INSTRUCTIONS:  Your ED provider has recommended an Outpatient Ultrasound.  Please call [phone] to schedule an appointment.  If your appointment is scheduled for a weekday (Monday-Friday), please go directly to the [facility] Radiology Department at least 15 minutes prior to your appointment time and tell them you are there for an ultrasound.  Please call [phone] with questions.

## 2016-12-15 NOTE — ED Notes (Addendum)
Pt transported to Xray. 

## 2016-12-15 NOTE — ED Notes (Signed)
Patient returned from xray.

## 2016-12-15 NOTE — ED Notes (Signed)
Pt refused Ibuprofen

## 2016-12-15 NOTE — ED Triage Notes (Signed)
Pt states that she has been having left leg swelling since last Monday.  Pt states that she started having pressure in the middle of her chest that started yesterday.  Pt states that the pain takes her breath and is intermittent.

## 2016-12-17 ENCOUNTER — Ambulatory Visit (HOSPITAL_COMMUNITY): Payer: Medicare Other

## 2016-12-17 ENCOUNTER — Ambulatory Visit (HOSPITAL_COMMUNITY)
Admission: RE | Admit: 2016-12-17 | Discharge: 2016-12-17 | Disposition: A | Discharge status: Home / Self Care | Payer: Medicare Other | Source: Ambulatory Visit | Attending: Emergency Medicine | Admitting: Emergency Medicine

## 2016-12-17 DIAGNOSIS — M7989 Other specified soft tissue disorders: Secondary | ICD-10-CM | POA: Insufficient documentation

## 2016-12-17 DIAGNOSIS — M79605 Pain in left leg: Secondary | ICD-10-CM | POA: Insufficient documentation

## 2016-12-18 ENCOUNTER — Other Ambulatory Visit: Payer: Self-pay | Admitting: Neurosurgery

## 2016-12-18 ENCOUNTER — Ambulatory Visit (HOSPITAL_COMMUNITY): Admission: RE | Admit: 2016-12-18 | Payer: Medicare Other | Source: Ambulatory Visit

## 2016-12-18 DIAGNOSIS — M542 Cervicalgia: Secondary | ICD-10-CM

## 2017-01-02 ENCOUNTER — Ambulatory Visit
Admission: RE | Admit: 2017-01-02 | Discharge: 2017-01-02 | Disposition: A | Discharge status: Home / Self Care | Payer: Medicare Other | Source: Ambulatory Visit | Attending: Neurosurgery | Admitting: Neurosurgery

## 2017-01-02 DIAGNOSIS — M542 Cervicalgia: Secondary | ICD-10-CM

## 2017-01-21 ENCOUNTER — Ambulatory Visit: Payer: Self-pay | Admitting: Podiatry

## 2017-02-11 ENCOUNTER — Ambulatory Visit (INDEPENDENT_AMBULATORY_CARE_PROVIDER_SITE_OTHER): Payer: Medicare Other

## 2017-02-11 ENCOUNTER — Encounter: Payer: Self-pay | Admitting: Podiatry

## 2017-02-11 ENCOUNTER — Ambulatory Visit (INDEPENDENT_AMBULATORY_CARE_PROVIDER_SITE_OTHER): Payer: Medicare Other | Admitting: Podiatry

## 2017-02-11 ENCOUNTER — Ambulatory Visit: Payer: Medicaid Other

## 2017-02-11 ENCOUNTER — Other Ambulatory Visit: Payer: Self-pay

## 2017-02-11 VITALS — BP 137/76 | HR 92 | Resp 16

## 2017-02-11 DIAGNOSIS — I872 Venous insufficiency (chronic) (peripheral): Secondary | ICD-10-CM

## 2017-02-11 DIAGNOSIS — M779 Enthesopathy, unspecified: Secondary | ICD-10-CM

## 2017-02-11 DIAGNOSIS — M7750 Other enthesopathy of unspecified foot: Secondary | ICD-10-CM | POA: Diagnosis not present

## 2017-02-11 NOTE — Progress Notes (Signed)
Subjective:  Patient ID: Brenda Richardson, female    DOB: 1980-03-18,  MRN: [id number] HPI Chief Complaint  Patient presents with  . Foot Pain    Dorsal midfoot left - aching, severe swelling some days, pulling sensations into shin x 1.5 years, worsened over last few months, PCP sent for venous doppler(neg)   . Diabetes    Last A1C- 5.7    36 y.o. female presents with the above complaint.   She states that the swelling in her left leg did not start until approximately 2 years after she had a total abdominal hysterectomy.  She denies any other trauma.  She states that she is able to tell when she is starting to swell with the tightness in that left anterior ankle.  She states that the swelling causes the foot to become painful.  She states that the swelling goes down at nighttime.  She states that the swelling is not present every day.  She states that it may go several days and not swelling all other days it may swell just a small amount and other days it may swell to the point where she cannot even walk on it.  Past Medical History:  Diagnosis Date  . Anemia   . Anxiety   . Bipolar disorder ([place])   . Breast infection 04/04/2010   hospitalized, given antibiotics, referred to surgeon  . Chronic pain   . Colonic adenoma   . Complication of anesthesia   . Diverticulitis   . Gastroparesis   . Helicobacter pylori (H. pylori)    s/p treatment  . Hemorrhoids, internal   . Hypertension   . Migraine headache   . Obesity   . PONV (postoperative nausea and vomiting)   . Seizures ([place])   . Vaginitis 04/04/2010  . Vitamin B12 deficiency    Past Surgical History:  Procedure Laterality Date  . ABDOMINAL HYSTERECTOMY    . APPENDECTOMY  2008   lower abd pain  . BILATERAL SALPINGOOPHORECTOMY  06/2009  . BREAST REDUCTION SURGERY  08/2008   from a 44 DDD to a 44 C  . CHOLECYSTECTOMY    . COLONOSCOPY  01/04/10   tubular adenoma of cecum, normal TI, patchy fibrotic changes involving sigm,  desc s/p bx neg. Next TCS 12/2016  . ESOPHAGOGASTRODUODENOSCOPY  01/04/10   normal esophagus s/p dilation, small hh, H pylori gastritis  . ESOPHAGOGASTRODUODENOSCOPY  2008   florid gastroesophageal reflux during exam but normal mucosa  . s/p hysterectomy  08/2007   No Cancer Cells  . TUBAL LIGATION    . TUMOR REMOVAL     from left shoulder  . WISDOM TOOTH EXTRACTION      Current Outpatient Medications:  .  ACCU-CHEK SOFTCLIX LANCETS lancets, USE TO CHECK FASTING GLUCOSE DAILY, Disp: , Rfl:  .  Blood Glucose Monitoring Suppl (ACCU-CHEK AVIVA PLUS) w/Device KIT, by Does not apply route., Disp: , Rfl:  .  EPINEPHrine 0.3 mg/0.3 mL IJ SOAJ injection, USE AS DIRECTED, Disp: , Rfl:  .  escitalopram (LEXAPRO) 5 MG tablet, Take by mouth., Disp: , Rfl:  .  tiZANidine (ZANAFLEX) 4 MG tablet, Take by mouth., Disp: , Rfl:  .  ACCU-CHEK AVIVA PLUS test strip, , Disp: , Rfl:  .  ALPRAZolam (XANAX) 0.25 MG tablet, Take 0.25 mg by mouth 3 (three) times daily as needed for anxiety., Disp: , Rfl:  .  cyclobenzaprine (FLEXERIL) 5 MG tablet, Take 5 mg by mouth daily., Disp: , Rfl: 0 .  FLUCELVAX  QUADRIVALENT 0.5 ML SUSY, TO BE ADMINISTERED BY PHARMACIST FOR IMMUNIZATION, Disp: , Rfl: 0 .  ibuprofen (ADVIL,MOTRIN) 800 MG tablet, Take 800 mg by mouth every 8 (eight) hours as needed., Disp: , Rfl: 12 .  lisinopril (PRINIVIL,ZESTRIL) 10 MG tablet, , Disp: , Rfl:  .  metFORMIN (GLUCOPHAGE-XR) 500 MG 24 hr tablet, , Disp: , Rfl:  .  naproxen (NAPROSYN) 500 MG tablet, TAKE ONE TABLET (500 MG TOTAL) BY MOUTH 2 (TWO) TIMES DAILY WITH MEALS., Disp: , Rfl: 2 .  oxyCODONE (ROXICODONE) 5 MG/5ML solution, TAKE 15MLS (15 MG ) BY MOUTH EVERY FOUR HOURS AS NEEDED FOR PAIN., Disp: , Rfl: 0 .  oxyCODONE-acetaminophen (PERCOCET/ROXICET) 5-325 MG tablet, Take 1 tablet by mouth every 6 (six) hours as needed., Disp: 6 tablet, Rfl: 0 .  pantoprazole (PROTONIX) 40 MG tablet, TAKE ONE TABLET (40 MG TOTAL) BY MOUTH DAILY., Disp: ,  Rfl: 2 .  zolpidem (AMBIEN) 10 MG tablet, Take 1 tablet by mouth at bedtime., Disp: , Rfl:   Allergies  Allergen Reactions  . Demerol [Meperidine] Anaphylaxis and Hives  . Prednisone Anaphylaxis and Other (See Comments)  . Tomato Anaphylaxis, Shortness Of Breath, Itching and Swelling  . Tramadol Itching and Rash  . Cephalosporins Other (See Comments)    Jitters, Shaking, Cold Sweats.   . Dust Mite Extract   . Ketorolac Tromethamine Itching  . Lipitor [Atorvastatin] Other (See Comments)    Yellow Spots   . Nalbuphine Nausea And Vomiting  . Pineapple Swelling    Tongue   . Tramadol Hcl Hives, Itching and Nausea Only  . Vicodin [Hydrocodone-Acetaminophen] Nausea And Vomiting  . Zofran Itching    To be given Benadryl prior upon administration of this medication  . Aspirin Other (See Comments)    G.I. Upset.    Review of Systems  Musculoskeletal: Positive for arthralgias and gait problem.  All other systems reviewed and are negative.  Objective:   Vitals:   02/11/17 1418  BP: 137/76  Pulse: 92  Resp: 16    General: Well developed, nourished, in no acute distress, alert and oriented x3   Dermatological: Skin is warm, dry and supple bilateral. Nails x 10 are well maintained; remaining integument appears unremarkable at this time. There are no open sores, no preulcerative lesions, no rash or signs of infection present.  Vascular: Dorsalis Pedis artery and Posterior Tibial artery pedal pulses are 2/4 bilateral with immedate capillary fill time. Pedal hair growth present. No varicosities and no lower extremity edema present bilateral.   Neruologic: Grossly intact via light touch bilateral. Vibratory intact via tuning fork bilateral. Protective threshold with Semmes Wienstein monofilament intact to all pedal sites bilateral. Patellar and Achilles deep tendon reflexes 2+ bilateral. No Babinski or clonus noted bilateral.   Musculoskeletal: No gross boney pedal deformities bilateral.  No pain, crepitus, or limitation noted with foot and ankle range of motion bilateral. Muscular strength 5/5 in all groups tested bilateral.  Mild edema to the dorsal foot brawny edema is noted to the dorsal lateral foot and tender in this area.  She states that the tenderness however is not there when the edema is not there.  She does have mild pitting edema to the left leg.  Gait: Unassisted, Nonantalgic.    Radiographs:  Radiographs taken today demonstrate rectus foot type very well-defined soft tissue margins normal rectus osseous architecture.  No major findings other than soft tissue edema overlying the dorsal and anterior foot and ankle.  Cutaneous edema is  also noted in the anterior leg.  Assessment & Plan:   Assessment: Most likely venous insufficiency or May Thurner syndrome.  Plan: Though she has been sent for a Doppler before I am going to send her for venous insufficiency studies.  I do not feel that this was associated with a blood clot though she does have a history of severe pain in the proximal calf at first onset.  We will await the findings of the study.  Otherwise I did recommend compression hose.  She states that she has some at home.     Max T. [place], [place]

## 2017-03-06 NOTE — Progress Notes (Signed)
This encounter was created in error - please disregard.

## 2017-04-08 ENCOUNTER — Ambulatory Visit (HOSPITAL_COMMUNITY)
Admission: RE | Admit: 2017-04-08 | Payer: Medicare Other | Source: Ambulatory Visit | Attending: Podiatry | Admitting: Podiatry

## 2017-09-10 DIAGNOSIS — Z9889 Other specified postprocedural states: Secondary | ICD-10-CM | POA: Insufficient documentation

## 2017-09-15 ENCOUNTER — Other Ambulatory Visit: Payer: Self-pay | Admitting: Physician Assistant

## 2017-09-15 DIAGNOSIS — N644 Mastodynia: Secondary | ICD-10-CM

## 2017-09-19 ENCOUNTER — Ambulatory Visit: Payer: Medicare Other

## 2017-09-19 ENCOUNTER — Ambulatory Visit
Admission: RE | Admit: 2017-09-19 | Discharge: 2017-09-19 | Disposition: A | Discharge status: Home / Self Care | Payer: Medicare Other | Source: Ambulatory Visit | Attending: Physician Assistant | Admitting: Physician Assistant

## 2017-09-19 DIAGNOSIS — N644 Mastodynia: Secondary | ICD-10-CM

## 2019-03-03 DIAGNOSIS — E6609 Other obesity due to excess calories: Secondary | ICD-10-CM | POA: Insufficient documentation

## 2019-05-21 ENCOUNTER — Other Ambulatory Visit: Payer: Self-pay | Admitting: Physician Assistant

## 2019-05-21 DIAGNOSIS — Z1231 Encounter for screening mammogram for malignant neoplasm of breast: Secondary | ICD-10-CM

## 2019-06-21 ENCOUNTER — Ambulatory Visit (INDEPENDENT_AMBULATORY_CARE_PROVIDER_SITE_OTHER): Payer: Medicare Other | Admitting: Psychology

## 2019-06-21 DIAGNOSIS — F3181 Bipolar II disorder: Secondary | ICD-10-CM

## 2019-07-01 ENCOUNTER — Ambulatory Visit: Payer: Medicare Other

## 2019-07-05 ENCOUNTER — Ambulatory Visit (INDEPENDENT_AMBULATORY_CARE_PROVIDER_SITE_OTHER): Payer: Medicare Other | Admitting: Psychology

## 2019-07-05 DIAGNOSIS — F3181 Bipolar II disorder: Secondary | ICD-10-CM | POA: Diagnosis not present

## 2019-07-12 ENCOUNTER — Ambulatory Visit (INDEPENDENT_AMBULATORY_CARE_PROVIDER_SITE_OTHER): Payer: Medicare Other | Admitting: Psychology

## 2019-07-12 DIAGNOSIS — F3181 Bipolar II disorder: Secondary | ICD-10-CM | POA: Diagnosis not present

## 2019-08-03 ENCOUNTER — Ambulatory Visit (INDEPENDENT_AMBULATORY_CARE_PROVIDER_SITE_OTHER): Payer: Medicare Other | Admitting: Psychology

## 2019-08-03 DIAGNOSIS — F3181 Bipolar II disorder: Secondary | ICD-10-CM

## 2019-08-18 ENCOUNTER — Other Ambulatory Visit: Payer: Medicare Other | Admitting: Adult Health

## 2019-08-27 ENCOUNTER — Ambulatory Visit (INDEPENDENT_AMBULATORY_CARE_PROVIDER_SITE_OTHER): Payer: Medicare Other | Admitting: Psychology

## 2019-08-27 DIAGNOSIS — F3181 Bipolar II disorder: Secondary | ICD-10-CM

## 2019-09-23 ENCOUNTER — Ambulatory Visit: Payer: Medicare Other | Admitting: Psychology

## 2019-09-27 DIAGNOSIS — J3089 Other allergic rhinitis: Secondary | ICD-10-CM | POA: Insufficient documentation

## 2019-10-08 ENCOUNTER — Ambulatory Visit: Payer: Medicare Other | Admitting: Psychology

## 2019-10-20 ENCOUNTER — Ambulatory Visit: Payer: Medicare Other

## 2019-11-01 ENCOUNTER — Ambulatory Visit: Payer: Medicare Other | Admitting: Adult Health

## 2019-11-03 ENCOUNTER — Other Ambulatory Visit: Payer: Self-pay

## 2019-11-03 ENCOUNTER — Ambulatory Visit
Admission: RE | Admit: 2019-11-03 | Discharge: 2019-11-03 | Disposition: A | Discharge status: Home / Self Care | Payer: Medicare Other | Source: Ambulatory Visit | Attending: Physician Assistant | Admitting: Physician Assistant

## 2019-11-03 DIAGNOSIS — Z1231 Encounter for screening mammogram for malignant neoplasm of breast: Secondary | ICD-10-CM

## 2019-11-05 ENCOUNTER — Encounter: Payer: Self-pay | Admitting: *Deleted

## 2019-11-08 ENCOUNTER — Ambulatory Visit: Payer: Medicare Other | Admitting: Cardiology

## 2019-11-08 ENCOUNTER — Encounter: Payer: Self-pay | Admitting: Cardiology

## 2019-11-08 NOTE — Progress Notes (Deleted)
Cardiology Office Note  Date: 11/08/2019   ID: Brenda Richardson 10-18-1980, MRN [id number]  PCP:  Medicine, [facility]  Cardiologist:  Rozann Lesches, MD Electrophysiologist:  None   No chief complaint on file.   History of Present Illness: Brenda Richardson is a 39 y.o. female referred for cardiology consultation by Ms. Brenda Richardson with [facility] for the evaluation of cardiomegaly.  Records indicate previous evaluation by Dr. Tommi Rumps in 2017 with [facility] in [place].  She was seen for evaluation of longstanding recurrent chest discomfort, also cardiomegaly mentioned in the records.  She had an echocardiogram obtained at that time, I am unable to retrieve the results.  Past Medical History:  Diagnosis Date  . Anemia   . Bipolar disorder ([place])   . Breast infection 04/04/2010  . Chronic pain   . Colonic adenoma   . Diverticulitis   . Essential hypertension   . Gastroparesis   . Helicobacter pylori (H. pylori)    Treated  . Hemorrhoids, internal   . Migraine headache   . Obesity   . Seizures ([place])   . Vitamin B12 deficiency   . Vitamin D deficiency     Past Surgical History:  Procedure Laterality Date  . ABDOMINAL HYSTERECTOMY    . APPENDECTOMY  2008   lower abd pain  . BILATERAL SALPINGOOPHORECTOMY  06/2009  . BREAST REDUCTION SURGERY  08/2008   from a 44 DDD to a 44 C  . CHOLECYSTECTOMY    . COLONOSCOPY  01/04/10   tubular adenoma of cecum, normal TI, patchy fibrotic changes involving sigm, desc s/p bx neg. Next TCS 12/2016  . ESOPHAGOGASTRODUODENOSCOPY  01/04/10   normal esophagus s/p dilation, small hh, H pylori gastritis  . ESOPHAGOGASTRODUODENOSCOPY  2008   florid gastroesophageal reflux during exam but normal mucosa  . TUBAL LIGATION    . TUMOR REMOVAL     from left shoulder  . WISDOM TOOTH EXTRACTION      Current Outpatient Medications  Medication Sig Dispense Refill  . ACCU-CHEK AVIVA PLUS test strip     .  ACCU-CHEK SOFTCLIX LANCETS lancets USE TO CHECK FASTING GLUCOSE DAILY    . ALPRAZolam (XANAX) 0.25 MG tablet Take 0.25 mg by mouth 3 (three) times daily as needed for anxiety.    . Blood Glucose Monitoring Suppl (ACCU-CHEK AVIVA PLUS) w/Device KIT by Does not apply route.    . cyclobenzaprine (FLEXERIL) 5 MG tablet Take 5 mg by mouth daily.  0  . EPINEPHrine 0.3 mg/0.3 mL IJ SOAJ injection USE AS DIRECTED    . escitalopram (LEXAPRO) 5 MG tablet Take by mouth.    Marland Kitchen FLUCELVAX QUADRIVALENT 0.5 ML SUSY TO BE ADMINISTERED BY PHARMACIST FOR IMMUNIZATION  0  . ibuprofen (ADVIL,MOTRIN) 800 MG tablet Take 800 mg by mouth every 8 (eight) hours as needed.  12  . Liraglutide (VICTOZA ) Inject 18 mg into the skin. 1.2 MG DAILY    . lisinopril (PRINIVIL,ZESTRIL) 10 MG tablet     . metFORMIN (GLUCOPHAGE-XR) 500 MG 24 hr tablet     . naproxen (NAPROSYN) 500 MG tablet TAKE ONE TABLET (500 MG TOTAL) BY MOUTH 2 (TWO) TIMES DAILY WITH MEALS.  2  . oxyCODONE (ROXICODONE) 5 MG/5ML solution TAKE 15MLS (15 MG ) BY MOUTH EVERY FOUR HOURS AS NEEDED FOR PAIN.  0  . oxyCODONE-acetaminophen (PERCOCET/ROXICET) 5-325 MG tablet Take 1 tablet by mouth every 6 (six) hours as needed. 6 tablet 0  .  pantoprazole (PROTONIX) 40 MG tablet TAKE ONE TABLET (40 MG TOTAL) BY MOUTH DAILY.  2  . tiZANidine (ZANAFLEX) 4 MG tablet Take by mouth.    . zolpidem (AMBIEN) 10 MG tablet Take 1 tablet by mouth at bedtime.     No current facility-administered medications for this visit.   Allergies:  Demerol [meperidine], Prednisone, Tomato, Tramadol, Cephalosporins, Dust mite extract, Ketorolac tromethamine, Lipitor [atorvastatin], Nalbuphine, Pineapple, Tramadol hcl, Vicodin [hydrocodone-acetaminophen], Zofran, and Aspirin   Social History: The patient  reports that she has never smoked. She has never used smokeless tobacco. She reports that she does not drink alcohol and does not use drugs.   Family History: The patient's family history  includes CAD in her sister; COPD in her sister; Cancer in her father and mother; Heart attack in an other family member.   ROS:  Please see the history of present illness. Otherwise, complete review of systems is positive for {NONE DEFAULTED:18576::"none"}.  All other systems are reviewed and negative.   Physical Exam: VS:  There were no vitals taken for this visit., BMI There is no height or weight on file to calculate BMI.  Wt Readings from Last 3 Encounters:  12/15/16 243 lb (110.2 kg)  06/15/16 243 lb (110.2 kg)  04/20/16 245 lb (111.1 kg)    General: Patient appears comfortable at rest. HEENT: Conjunctiva and lids normal, oropharynx clear with moist mucosa. Neck: Supple, no elevated JVP or carotid bruits, no thyromegaly. Lungs: Clear to auscultation, nonlabored breathing at rest. Cardiac: Regular rate and rhythm, no S3 or significant systolic murmur, no pericardial rub. Abdomen: Soft, nontender, no hepatomegaly, bowel sounds present, no guarding or rebound. Extremities: No pitting edema, distal pulses 2+. Skin: Warm and dry. Musculoskeletal: No kyphosis. Neuropsychiatric: Alert and oriented x3, affect grossly appropriate.  ECG:  An ECG dated 12/15/2016 was personally reviewed today and demonstrated:  Sinus rhythm with possible left atrial enlargement and nonspecific T wave changes.  Recent Labwork:  March 2021: Hemoglobin 11.4, platelets 320, potassium 5.3, BUN 9, creatinine 0.91, AST 22, hemoglobin A1c 5.3%, cholesterol 155, triglycerides 167, HDL 52, LDL 75  Other Studies Reviewed Today:  Echocardiogram 06/09/2008: SUMMARY  - Overall left ventricular systolic function was normal. Left     ventricular ejection fraction was estimated to be 60 %. There     were no left ventricular regional wall motion abnormalities.     There was mild focal basal septal hypertrophy.  Assessment and Plan:    Medication Adjustments/Labs and Tests Ordered: Current medicines are  reviewed at length with the patient today.  Concerns regarding medicines are outlined above.   Tests Ordered: No orders of the defined types were placed in this encounter.   Medication Changes: No orders of the defined types were placed in this encounter.   Disposition:  Follow up {follow up:15908}  Signed, Satira Sark, MD, [facility] 11/08/2019 8:13 AM    [facility] [place], [place], [place] [address] Phone: [phone]; Fax: [phone]

## 2019-11-09 ENCOUNTER — Ambulatory Visit: Payer: Medicare Other | Admitting: Adult Health

## 2019-11-10 ENCOUNTER — Ambulatory Visit: Payer: Medicare Other | Admitting: Psychology

## 2019-11-11 ENCOUNTER — Ambulatory Visit: Payer: Medicare Other | Admitting: Cardiology

## 2019-11-26 ENCOUNTER — Ambulatory Visit: Payer: Medicare Other | Admitting: Psychology

## 2019-12-30 ENCOUNTER — Ambulatory Visit: Payer: Medicare Other | Admitting: Cardiology

## 2019-12-30 NOTE — Progress Notes (Deleted)
Cardiology Office Note  Date: 12/30/2019   ID: Brenda Richardson, DOB 1980/09/12, MRN [id number]  PCP:  Medicine, [facility]  Cardiologist:  Rozann Lesches, MD Electrophysiologist:  None   No chief complaint on file.   History of Present Illness: Brenda Richardson is a 39 y.o. female referred for cardiology consultation by Ms. Loyal Buba PA-C with [facility] for a reported history of cardiomegaly.  I reviewed the available records.  She reportedly had an Echocardiogram done back in 2017 through [facility], although I am not able to retrieve the results.  More remote study from 2010 indicated normal LV chamber size with LVEF 60% and mild basal septal hypertrophy.  Chest CTA in 2018 reported normal heart size and great vessels.  She was seen by Dr. Tommi Rumps with [facility] cardiology back in 2017 for evaluation of chest pain.  Mention was made of cardiomegaly as a diagnosis at that time as well, echocardiogram was ordered to clarify, but again these results are not available to me.  Past Medical History:  Diagnosis Date  . Anemia   . Bipolar disorder ([place])   . Breast infection 04/04/2010  . Chronic pain   . Colonic adenoma   . Diverticulitis   . Essential hypertension   . Gastroparesis   . Helicobacter pylori (H. pylori)    Treated  . Hemorrhoids, internal   . Migraine headache   . Obesity   . Seizures ([place])   . Vitamin B12 deficiency   . Vitamin D deficiency     Past Surgical History:  Procedure Laterality Date  . ABDOMINAL HYSTERECTOMY    . APPENDECTOMY  2008   lower abd pain  . BILATERAL SALPINGOOPHORECTOMY  06/2009  . BREAST REDUCTION SURGERY  08/2008   from a 44 DDD to a 44 C  . CHOLECYSTECTOMY    . COLONOSCOPY  01/04/10   tubular adenoma of cecum, normal TI, patchy fibrotic changes involving sigm, desc s/p bx neg. Next TCS 12/2016  . ESOPHAGOGASTRODUODENOSCOPY  01/04/10   normal esophagus s/p dilation, small hh, H pylori gastritis  .  ESOPHAGOGASTRODUODENOSCOPY  2008   florid gastroesophageal reflux during exam but normal mucosa  . TUBAL LIGATION    . TUMOR REMOVAL     from left shoulder  . WISDOM TOOTH EXTRACTION      Current Outpatient Medications  Medication Sig Dispense Refill  . ACCU-CHEK AVIVA PLUS test strip     . ACCU-CHEK SOFTCLIX LANCETS lancets USE TO CHECK FASTING GLUCOSE DAILY    . ALPRAZolam (XANAX) 0.25 MG tablet Take 0.25 mg by mouth 3 (three) times daily as needed for anxiety.    . Blood Glucose Monitoring Suppl (ACCU-CHEK AVIVA PLUS) w/Device KIT by Does not apply route.    . cyclobenzaprine (FLEXERIL) 5 MG tablet Take 5 mg by mouth daily.  0  . EPINEPHrine 0.3 mg/0.3 mL IJ SOAJ injection USE AS DIRECTED    . escitalopram (LEXAPRO) 5 MG tablet Take by mouth.    Marland Kitchen FLUCELVAX QUADRIVALENT 0.5 ML SUSY TO BE ADMINISTERED BY PHARMACIST FOR IMMUNIZATION  0  . ibuprofen (ADVIL,MOTRIN) 800 MG tablet Take 800 mg by mouth every 8 (eight) hours as needed.  12  . Liraglutide (VICTOZA [place]) Inject 18 mg into the skin. 1.2 MG DAILY    . lisinopril (PRINIVIL,ZESTRIL) 10 MG tablet     . metFORMIN (GLUCOPHAGE-XR) 500 MG 24 hr tablet     . naproxen (NAPROSYN) 500 MG tablet TAKE ONE TABLET (500 MG TOTAL)  BY MOUTH 2 (TWO) TIMES DAILY WITH MEALS.  2  . oxyCODONE (ROXICODONE) 5 MG/5ML solution TAKE 15MLS (15 MG ) BY MOUTH EVERY FOUR HOURS AS NEEDED FOR PAIN.  0  . oxyCODONE-acetaminophen (PERCOCET/ROXICET) 5-325 MG tablet Take 1 tablet by mouth every 6 (six) hours as needed. 6 tablet 0  . pantoprazole (PROTONIX) 40 MG tablet TAKE ONE TABLET (40 MG TOTAL) BY MOUTH DAILY.  2  . tiZANidine (ZANAFLEX) 4 MG tablet Take by mouth.    . zolpidem (AMBIEN) 10 MG tablet Take 1 tablet by mouth at bedtime.     No current facility-administered medications for this visit.   Allergies:  Demerol [meperidine], Prednisone, Tomato, Tramadol, Cephalosporins, Dust mite extract, Ketorolac tromethamine, Lipitor [atorvastatin], Nalbuphine,  Pineapple, Tramadol hcl, Vicodin [hydrocodone-acetaminophen], Zofran, and Aspirin   Social History: The patient  reports that she has never smoked. She has never used smokeless tobacco. She reports that she does not drink alcohol and does not use drugs.   Family History: The patient's family history includes CAD in her sister; COPD in her sister; Cancer in her father and mother; Heart attack in an other family member.   ROS:  Please see the history of present illness. Otherwise, complete review of systems is positive for {NONE DEFAULTED:18576::"none"}.  All other systems are reviewed and negative.   Physical Exam: VS:  There were no vitals taken for this visit., BMI There is no height or weight on file to calculate BMI.  Wt Readings from Last 3 Encounters:  12/15/16 243 lb (110.2 kg)  06/15/16 243 lb (110.2 kg)  04/20/16 245 lb (111.1 kg)    General: Patient appears comfortable at rest. HEENT: Conjunctiva and lids normal, oropharynx clear with moist mucosa. Neck: Supple, no elevated JVP or carotid bruits, no thyromegaly. Lungs: Clear to auscultation, nonlabored breathing at rest. Cardiac: Regular rate and rhythm, no S3 or significant systolic murmur, no pericardial rub. Abdomen: Soft, nontender, no hepatomegaly, bowel sounds present, no guarding or rebound. Extremities: No pitting edema, distal pulses 2+. Skin: Warm and dry. Musculoskeletal: No kyphosis. Neuropsychiatric: Alert and oriented x3, affect grossly appropriate.  ECG:  An ECG dated 12/15/2016 was personally reviewed today and demonstrated:  Sinus rhythm with possible left atrial enlargement, nonspecific T wave changes.  Recent Labwork:  March 2021: Hemoglobin 11.4, platelets 320, potassium 5.3, BUN 9, creatinine 0.91, ALT 24, AST 22, hemoglobin A1c 5.3% to 5, triglycerides 167, HDL 52, LDL 75  Other Studies Reviewed Today:  Echocardiogram 06/09/2008: SUMMARY  - Overall left ventricular systolic function was normal. Left      ventricular ejection fraction was estimated to be 60 %. There     were no left ventricular regional wall motion abnormalities.     There was mild focal basal septal hypertrophy.   Assessment and Plan:    Medication Adjustments/Labs and Tests Ordered: Current medicines are reviewed at length with the patient today.  Concerns regarding medicines are outlined above.   Tests Ordered: No orders of the defined types were placed in this encounter.   Medication Changes: No orders of the defined types were placed in this encounter.   Disposition:  Follow up {follow up:15908}  Signed, Satira Sark, MD, [facility] 12/30/2019 8:27 AM    [facility] [facility] [place], [place], [place] [address] Phone: [phone]; Fax: [phone]

## 2020-01-07 ENCOUNTER — Ambulatory Visit: Payer: Medicare Other

## 2020-02-18 ENCOUNTER — Ambulatory Visit: Payer: Medicare Other

## 2020-02-23 ENCOUNTER — Ambulatory Visit: Payer: Medicare Other

## 2020-03-13 ENCOUNTER — Ambulatory Visit: Payer: Medicare Other | Admitting: Cardiology

## 2020-03-15 ENCOUNTER — Ambulatory Visit: Payer: Medicare Other

## 2020-03-22 ENCOUNTER — Ambulatory Visit: Payer: Medicare Other | Admitting: Allergy & Immunology

## 2020-04-20 ENCOUNTER — Encounter: Payer: Self-pay | Admitting: Cardiology

## 2020-04-20 ENCOUNTER — Telehealth: Payer: Self-pay | Admitting: Cardiology

## 2020-04-20 ENCOUNTER — Encounter: Payer: Self-pay | Admitting: *Deleted

## 2020-04-20 ENCOUNTER — Ambulatory Visit (INDEPENDENT_AMBULATORY_CARE_PROVIDER_SITE_OTHER): Payer: Medicare Other | Admitting: Cardiology

## 2020-04-20 VITALS — BP 102/78 | HR 60 | Ht 66.0 in | Wt 252.0 lb

## 2020-04-20 DIAGNOSIS — Z8249 Family history of ischemic heart disease and other diseases of the circulatory system: Secondary | ICD-10-CM

## 2020-04-20 DIAGNOSIS — I10 Essential (primary) hypertension: Secondary | ICD-10-CM | POA: Diagnosis not present

## 2020-04-20 DIAGNOSIS — Z136 Encounter for screening for cardiovascular disorders: Secondary | ICD-10-CM

## 2020-04-20 DIAGNOSIS — R079 Chest pain, unspecified: Secondary | ICD-10-CM | POA: Diagnosis not present

## 2020-04-20 NOTE — Telephone Encounter (Signed)
Pre-cert Verification for the following procedure    1.LEXISCAN 2.ECHO   DATE:   05/11/2020  LOCATION:  [facility]

## 2020-04-20 NOTE — Patient Instructions (Signed)
Your physician recommends that you schedule a follow-up appointment in: PENDING WITH DR MCDOWELL  Your physician recommends that you continue on your current medications as directed. Please refer to the Current Medication list given to you today.  Your physician has requested that you have an echocardiogram. Echocardiography is a painless test that uses sound waves to create images of your heart. It provides your doctor with information about the size and shape of your heart and how well your heart's chambers and valves are working. This procedure takes approximately one hour. There are no restrictions for this procedure.  Your physician has requested that you have a lexiscan myoview. For further information please visit [email or web address]. Please follow instruction sheet, as given.  Thank you for choosing [facility]!!     

## 2020-04-20 NOTE — Progress Notes (Signed)
Cardiology Office Note  Date: 04/20/2020   ID: Brenda Richardson, DOB 1980/06/11, MRN [id number]  PCP:  Nicholes Rough, PA-C  Cardiologist:  Rozann Lesches, MD Electrophysiologist:  None   Chief Complaint  Patient presents with  . Referred secondary to cardiomegaly  . Recurrent chest pain    History of Present Illness: Brenda Richardson is a 40 y.o. female referred for cardiology consultation by Ms. Loyal Buba PA-C for the evaluation of reported cardiomegaly.  I reviewed the available records.  She tells me that her main concern is a history of recurrent chest pain that has been going on for several years.  She describes a pressure sensation at times on the left side of her chest, other times more of a sharp sensation, no clear precipitant with any typical level of activity, no obvious alleviating factors.  Sometimes she feels discomfort in her left arm, sometimes up into her neck.  She was seen by Dr. Tommi Rumps with [facility] in 2017 for evaluation of chest pain, note listed "cardiomegaly" on her problem list and she was referred for an echocardiogram.  Unfortunately, I am unable to retrieve the results.  I personally reviewed her ECG today which shows normal sinus rhythm.  We went over her medications, she states that she has been on a fairly consistent regimen with no recent additions.  Reports improvement in reflux symptoms on Protonix, but this has not changed her chest pain.  She did undergo an attempted treadmill test several years ago, was not able to navigate the treadmill and the test was reportedly nondiagnostic.  Past Medical History:  Diagnosis Date  . Anemia   . Bipolar disorder ([place])   . Breast infection 04/04/2010  . Chronic pain   . Colonic adenoma   . Diverticulitis   . Essential hypertension   . Gastroparesis   . Helicobacter pylori (H. pylori)    Treated  . Hemorrhoids, internal   . Migraine headache   . Obesity   . Seizures ([place])   . Vitamin B12  deficiency   . Vitamin D deficiency     Past Surgical History:  Procedure Laterality Date  . ABDOMINAL HYSTERECTOMY    . APPENDECTOMY  2008   lower abd pain  . BILATERAL SALPINGOOPHORECTOMY  06/2009  . BREAST REDUCTION SURGERY  08/2008   from a 44 DDD to a 44 C  . CHOLECYSTECTOMY    . COLONOSCOPY  01/04/10   tubular adenoma of cecum, normal TI, patchy fibrotic changes involving sigm, desc s/p bx neg. Next TCS 12/2016  . ESOPHAGOGASTRODUODENOSCOPY  01/04/10   normal esophagus s/p dilation, small hh, H pylori gastritis  . ESOPHAGOGASTRODUODENOSCOPY  2008   florid gastroesophageal reflux during exam but normal mucosa  . TUBAL LIGATION    . TUMOR REMOVAL     from left shoulder  . WISDOM TOOTH EXTRACTION      Current Outpatient Medications  Medication Sig Dispense Refill  . ACCU-CHEK AVIVA PLUS test strip     . ACCU-CHEK SOFTCLIX LANCETS lancets USE TO CHECK FASTING GLUCOSE DAILY    . Blood Glucose Monitoring Suppl (ACCU-CHEK AVIVA PLUS) w/Device KIT by Does not apply route.    . busPIRone (BUSPAR) 10 MG tablet Take 0.5 to 1 tablet up to twice daily as needed for anxiety/stress.    . cyclobenzaprine (FEXMID) 7.5 MG tablet Take 7.5 mg by mouth 2 (two) times daily as needed.    Marland Kitchen EPINEPHrine 0.3 mg/0.3 mL IJ SOAJ injection USE AS  DIRECTED    . lisinopril (PRINIVIL,ZESTRIL) 10 MG tablet     . oxyCODONE (ROXICODONE) 5 MG/5ML solution TAKE 15MLS (15 MG ) BY MOUTH EVERY FOUR HOURS AS NEEDED FOR PAIN.  0  . pantoprazole (PROTONIX) 40 MG tablet TAKE ONE TABLET (40 MG TOTAL) BY MOUTH DAILY.  2  . Semaglutide,0.25 or 0.5MG/DOS, (OZEMPIC, 0.25 OR 0.5 MG/DOSE,) 2 MG/1.5ML SOPN Inject into the skin once a week.    . zolpidem (AMBIEN) 10 MG tablet Take 1 tablet by mouth at bedtime.    Marland Kitchen FLUCELVAX QUADRIVALENT 0.5 ML SUSY TO BE ADMINISTERED BY PHARMACIST FOR IMMUNIZATION (Patient not taking: Reported on 04/20/2020)  0   No current facility-administered medications for this visit.   Allergies:   Demerol [meperidine], Prednisone, Tomato, Tramadol, Cephalosporins, Dust mite extract, Ketorolac tromethamine, Lipitor [atorvastatin], Nalbuphine, Pineapple, Tramadol hcl, Vicodin [hydrocodone-acetaminophen], Zofran, and Aspirin   Social History: The patient  reports that she has never smoked. She has never used smokeless tobacco. She reports that she does not drink alcohol and does not use drugs.   Family History: The patient's family history includes CAD in her sister; COPD in her sister; Cancer in her father and mother; Heart attack in an other family member.   ROS: No palpitations or syncope.  Physical Exam: VS:  BP 102/78   Pulse 60   Ht 5' 6"  (1.676 m)   Wt 252 lb (114.3 kg)   SpO2 96%   BMI 40.67 kg/m , BMI Body mass index is 40.67 kg/m.  Wt Readings from Last 3 Encounters:  04/20/20 252 lb (114.3 kg)  12/15/16 243 lb (110.2 kg)  06/15/16 243 lb (110.2 kg)    General: Patient appears comfortable at rest. HEENT: Conjunctiva and lids normal, wearing a mask. Neck: Supple, no elevated JVP or carotid bruits, no thyromegaly. Lungs: Clear to auscultation, nonlabored breathing at rest. Cardiac: Regular rate and rhythm, no S3, 8-2/5 basal systolic murmur, no pericardial rub. Abdomen: Bowel sounds present. Extremities: No pitting edema, distal pulses 2+. Skin: Warm and dry. Musculoskeletal: No kyphosis. Neuropsychiatric: Alert and oriented x3, affect grossly appropriate.  ECG:  An ECG dated 12/15/2016 was personally reviewed today and demonstrated:  Sinus rhythm with nonspecific T wave changes and possible left atrial enlargement.  Recent Labwork:  March 2021: Hemoglobin 11.4, platelets 325.3, BUN 9, creatinine 0.91, ALT 24, AST, hemoglobin A1c 5.3%, cholesterol 155, triglycerides 167, HDL 52, LDL 75  Other Studies Reviewed Today:  No prior cardiac studies available for review today.  Assessment and Plan:  1.  Chronic history of recurrent chest pain as outlined above in a  40 year old woman with history of hypertension, obesity, and some degree of heart disease in first-degree relatives.  Baseline ECG is normal today.  I am unable to retrieve results of previous cardiac evaluation through [facility].  Uncertain where cardiomegaly was determined, presumably an imaging study over time.  Her ECG does not suggest LVH.  Plan at this point is to obtain an echocardiogram to assess cardiac structure and function, also a Lexiscan Myoview for formal ischemic evaluation.  She cannot walk on a treadmill.  2.  Essential hypertension, currently on lisinopril.  Blood pressure is normal today.  Medication Adjustments/Labs and Tests Ordered: Current medicines are reviewed at length with the patient today.  Concerns regarding medicines are outlined above.   Tests Ordered: Orders Placed This Encounter  Procedures  . NM Myocar Multi W/Spect W/Wall Motion / EF  . EKG 12-Lead  . ECHOCARDIOGRAM COMPLETE  Medication Changes: No orders of the defined types were placed in this encounter.   Disposition:  Follow up test results.  Signed, Satira Sark, MD, [facility] 04/20/2020 3:29 PM    [facility] [place], [place], [place] [address] Phone: [phone]; Fax: [phone]

## 2020-05-03 ENCOUNTER — Encounter: Payer: Self-pay | Admitting: Allergy & Immunology

## 2020-05-03 ENCOUNTER — Other Ambulatory Visit: Payer: Self-pay

## 2020-05-03 ENCOUNTER — Ambulatory Visit (INDEPENDENT_AMBULATORY_CARE_PROVIDER_SITE_OTHER): Payer: Medicare Other | Admitting: Allergy & Immunology

## 2020-05-03 VITALS — BP 118/72 | HR 98 | Temp 97.6°F | Resp 18 | Ht 64.96 in | Wt 254.0 lb

## 2020-05-03 DIAGNOSIS — T7800XD Anaphylactic reaction due to unspecified food, subsequent encounter: Secondary | ICD-10-CM | POA: Diagnosis not present

## 2020-05-03 DIAGNOSIS — J310 Chronic rhinitis: Secondary | ICD-10-CM | POA: Diagnosis not present

## 2020-05-03 MED ORDER — EPINEPHRINE 0.3 MG/0.3ML IJ SOAJ: 0.3000 mg | INTRAMUSCULAR | 2 refills | Status: AC | PRN

## 2020-05-03 NOTE — Progress Notes (Signed)
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 Chronic rhinitis - Testing today was negative to the entire panel. - Copy of testing results provided. - We are going to get blood work to confirm this. - We will call you in 1-2 weeks with the results of the testing.   2. 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 - We are going to confirm with blood testing to tomato and pineapple. - We will call you in 1-2 weeks with the results of the testing. - EpiPen updated today. - Anaphylaxis Management Plan provided today.  3. Return in about 6 months (around 10/31/2020).   Subjective:   Brenda Richardson is a 40 y.o. female presenting today for evaluation of  Chief Complaint  Patient presents with  . Allergy Testing    BETHANNIE IGLEHART has a history of the following: Patient Active Problem List   Diagnosis Date Noted  . Lumbosacral spondylosis without myelopathy 10/19/2015  . Facet arthropathy, lumbar 10/06/2015  . IFG (impaired fasting glucose) 09/20/2015  . B12 deficiency 06/27/2015  . Vitamin D deficiency 06/27/2015  . Insomnia due to medical condition 06/12/2015  . PTSD (post-traumatic stress disorder) 02/28/2014  . Intractable migraine without aura and without status migrainosus 12/10/2013  . Neuropathy 12/10/2013  . Spells 12/10/2013  . Uncontrolled type 2 diabetes mellitus without complication, without long-term current use of insulin 12/10/2013  . Seizures ([place]) 09/29/2012  . Odynophagia 04/18/2011  . Esophageal dysphagia 04/18/2011  . Cellulitis of breast 05/30/2010  . Pain in limb 03/08/2010  . ANEMIA-UNSPECIFIED 12/11/2009  . HEMATOCHEZIA 12/11/2009  . DYSPHAGIA 12/11/2009  . Epilepsy ([place]) 11/10/2009  . DIVERTICULITIS, COLON  06/04/2009  . ABDOMINAL PAIN, GENERALIZED 06/04/2009  . NAUSEA AND VOMITING 05/30/2009  . ABDOMINAL PAIN 05/30/2009  . EPIGASTRIC PAIN 05/30/2009  . PELVIC  PAIN 05/30/2009  . Diverticulosis of colon 01/13/2009  . Urinary tract infection, site not specified 01/09/2009  . Depressive disorder, not elsewhere classified 01/05/2009  . HYPERGLYCEMIA 10/11/2008  . BIPOLAR AFFECTIVE DISORDER 09/15/2008  . PROTEINURIA 04/18/2008  . Bipolar disorder, current episode depressed, mild or moderate severity, unspecified ([place]) 03/28/2008  . CHEST PAIN 03/28/2008  . ESSENTIAL HYPERTENSION, BENIGN 02/12/2008  . LOW BACK PAIN, MILD 02/12/2008  . VENTRICULAR HYPERTROPHY, LEFT 04/10/2007  . COSTOCHONDRITIS, RECURRENT 04/10/2007  . OBESITY NOS 09/19/2006  . MIGRAINE HEADACHE 03/28/2006  . HEMORRHOIDS, INTERNAL 03/28/2006  . GERD 03/28/2006  . CONSTIPATION NOS 03/28/2006    History obtained from: chart review and patient.  Ronelle Nigh was referred by Nicholes Rough, PA-C.     [name] is a 40 y.o. female presenting for an evaluation of environmental and food allergies. She has been tested in the past and is just wanting to "update [her] testing".   Allergic Rhinitis Symptom History: She has been tested before. She was tested somewhere in [place]. She does not remember what was positive. She denies allergic rhinitis symptoms. She is allergic to "foods, medicines, etc".   Food Allergy Symptom History: She reports allergies to tomatoes (stop breathing). Pineapples make her tongue swell. She can eat peanuts, tree nuts, wheat, eggs, milk and seafood.  She does not take anything daily for her symptoms. She reports that she is supposed to use her epinephrine injector when needed. She carries around  Benadryl and her EpiPen. She wants something smaller for her EpiPen.   She has a number of drug allergies. She has never undergone testing for any of these and is not really interested in evaluating these.   She  has a history of chronic back pain. 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 She has been on disability for a number of years.   Otherwise, there is no history of other atopic diseases, including drug allergies, stinging insect allergies, eczema, urticaria or contact dermatitis. There is no significant infectious history. Vaccinations are up to date.    Past Medical History: Patient Active Problem List   Diagnosis Date Noted  . Lumbosacral spondylosis without myelopathy 10/19/2015  . Facet arthropathy, lumbar 10/06/2015  . IFG (impaired fasting glucose) 09/20/2015  . B12 deficiency 06/27/2015  . Vitamin D deficiency 06/27/2015  . Insomnia due to medical condition 06/12/2015  . PTSD (post-traumatic stress disorder) 02/28/2014  . Intractable migraine without aura and without status migrainosus 12/10/2013  . Neuropathy 12/10/2013  . Spells 12/10/2013  . Uncontrolled type 2 diabetes mellitus without complication, without long-term current use of insulin 12/10/2013  . Seizures ([place]) 09/29/2012  . Odynophagia 04/18/2011  . Esophageal dysphagia 04/18/2011  . Cellulitis of breast 05/30/2010  . Pain in limb 03/08/2010  . ANEMIA-UNSPECIFIED 12/11/2009  . HEMATOCHEZIA 12/11/2009  . DYSPHAGIA 12/11/2009  . Epilepsy ([place]) 11/10/2009  . DIVERTICULITIS, COLON 06/04/2009  . ABDOMINAL PAIN, GENERALIZED 06/04/2009  . NAUSEA AND VOMITING 05/30/2009  . ABDOMINAL PAIN 05/30/2009  . EPIGASTRIC PAIN 05/30/2009  . PELVIC  PAIN 05/30/2009  . Diverticulosis of colon 01/13/2009  . Urinary tract infection, site not specified 01/09/2009  . Depressive disorder, not elsewhere classified 01/05/2009  . HYPERGLYCEMIA 10/11/2008  . BIPOLAR AFFECTIVE DISORDER 09/15/2008  . PROTEINURIA 04/18/2008  . Bipolar disorder, current episode depressed, mild or moderate severity, unspecified ([place]) 03/28/2008  . CHEST PAIN 03/28/2008  . ESSENTIAL HYPERTENSION, BENIGN 02/12/2008  . LOW  BACK PAIN, MILD 02/12/2008  . VENTRICULAR HYPERTROPHY, LEFT 04/10/2007  . COSTOCHONDRITIS, RECURRENT 04/10/2007  . OBESITY NOS 09/19/2006  . MIGRAINE HEADACHE 03/28/2006  . HEMORRHOIDS, INTERNAL 03/28/2006  . GERD 03/28/2006  . CONSTIPATION NOS 03/28/2006    Medication List:  Allergies as of 05/03/2020      Reactions   Demerol [meperidine] Anaphylaxis, Hives   Prednisone Anaphylaxis, Other (See Comments)   Tomato Anaphylaxis, Shortness Of Breath, Itching, Swelling   Tramadol Itching, Rash   Cephalosporins Other (See Comments)   Jitters, Shaking, Cold Sweats.    Dust Mite Extract    Ketorolac Tromethamine Itching   Lipitor [atorvastatin] Other (See Comments)   Yellow Spots    Nalbuphine Nausea And Vomiting   Pineapple Swelling   Tongue    Tramadol Hcl Hives, Itching, Nausea Only   Vicodin [hydrocodone-acetaminophen] Nausea And Vomiting   Zofran Itching   To be given Benadryl prior upon administration of this medication   Aspirin Other (See Comments)   G.I. Upset.       Medication List       Accurate as of May 03, 2020  4:32 PM. If you have any questions, ask your nurse or doctor.        STOP taking these medications   Flucelvax Quadrivalent 0.5 ML injection Generic drug: influenza vaccine Stopped by: Valentina Shaggy, MD     TAKE these medications   Accu-Chek Aviva Plus test strip Generic drug: glucose blood   Accu-Chek Aviva  Plus w/Device Kit by Does not apply route.   Accu-Chek Softclix Lancets lancets USE TO CHECK FASTING GLUCOSE DAILY   busPIRone 10 MG tablet Commonly known as: BUSPAR Take 0.5 to 1 tablet up to twice daily as needed for anxiety/stress.   cyclobenzaprine 7.5 MG tablet Commonly known as: FEXMID Take 7.5 mg by mouth 2 (two) times daily as needed.   EPINEPHrine 0.3 mg/0.3 mL Soaj injection Commonly known as: EPI-PEN USE AS DIRECTED   lisinopril 10 MG tablet Commonly known as: ZESTRIL   oxyCODONE 5 MG/5ML  solution Commonly known as: ROXICODONE TAKE 15MLS (15 MG ) BY MOUTH EVERY FOUR HOURS AS NEEDED FOR PAIN.   Ozempic (0.25 or 0.5 MG/DOSE) 2 MG/1.5ML Sopn Generic drug: Semaglutide(0.25 or 0.5MG/DOS) Inject into the skin once a week.   pantoprazole 40 MG tablet Commonly known as: PROTONIX TAKE ONE TABLET (40 MG TOTAL) BY MOUTH DAILY.   sertraline 50 MG tablet Commonly known as: ZOLOFT Take 50 mg by mouth daily.   zolpidem 10 MG tablet Commonly known as: AMBIEN Take 1 tablet by mouth at bedtime.       Birth History: non-contributory  Developmental History: non-contributory  Past Surgical History: Past Surgical History:  Procedure Laterality Date  . ABDOMINAL HYSTERECTOMY    . APPENDECTOMY  2008   lower abd pain  . BILATERAL SALPINGOOPHORECTOMY  06/2009  . BREAST REDUCTION SURGERY  08/2008   from a 44 DDD to a 44 C  . CHOLECYSTECTOMY    . COLONOSCOPY  01/04/10   tubular adenoma of cecum, normal TI, patchy fibrotic changes involving sigm, desc s/p bx neg. Next TCS 12/2016  . ESOPHAGOGASTRODUODENOSCOPY  01/04/10   normal esophagus s/p dilation, small hh, H pylori gastritis  . ESOPHAGOGASTRODUODENOSCOPY  2008   florid gastroesophageal reflux during exam but normal mucosa  . TUBAL LIGATION    . TUMOR REMOVAL     from left shoulder  . WISDOM TOOTH EXTRACTION       Family History: Family History  Problem Relation Age of Onset  . Cancer Mother        cervical, deceased age 60  . Cancer Father        lung cancer, deceased age 99  . COPD Sister   . CAD Sister   . Heart attack Other   . Anesthesia problems Neg Hx   . Hypotension Neg Hx   . Malignant hyperthermia Neg Hx   . Pseudochol deficiency Neg Hx      Social History: Talula lives at home with her family.  They live in a house of unknown age.  There is wood flooring throughout the home.  They have kerosene for heating and fans for cooling.  There are 2 dogs inside of the home.  There are dust mite covers on the  bed, but not the pillows.  There is no tobacco exposure.  She currently is disabled.  She is not exposed to fumes, chemicals, or dust.  She does not have a HEPA filter in the home.   Review of Systems  Constitutional: Negative.  Negative for chills, fever, malaise/fatigue and weight loss.  HENT: Positive for congestion. Negative for ear discharge and ear pain.   Eyes: Negative for pain, discharge and redness.  Respiratory: Negative for cough, sputum production, shortness of breath and wheezing.   Cardiovascular: Negative.  Negative for chest pain and palpitations.  Gastrointestinal: Negative for abdominal pain, constipation, diarrhea, heartburn, nausea and vomiting.  Skin: Negative.  Negative for itching and  rash.  Neurological: Negative for dizziness and headaches.  Endo/Heme/Allergies: Negative for environmental allergies. Does not bruise/bleed easily.       Objective:   Blood pressure 118/72, pulse 98, temperature 97.6 F (36.4 C), temperature source Temporal, resp. rate 18, height 5' 4.96" (1.65 m), weight 254 lb (115.2 kg), SpO2 95 %. Body mass index is 42.32 kg/m.   Physical Exam:   Physical Exam Constitutional:      Appearance: She is well-developed.  HENT:     Head: Normocephalic and atraumatic.     Right Ear: Tympanic membrane, ear canal and external ear normal.     Left Ear: Tympanic membrane, ear canal and external ear normal.     Nose: No nasal deformity, septal deviation, mucosal edema, rhinorrhea or epistaxis.     Right Turbinates: Enlarged and swollen.     Left Turbinates: Enlarged and swollen.     Right Sinus: No maxillary sinus tenderness or frontal sinus tenderness.     Left Sinus: No maxillary sinus tenderness or frontal sinus tenderness.     Mouth/Throat:     Mouth: Oropharynx is clear and moist. Mucous membranes are not pale and not dry.     Pharynx: Uvula midline.     Comments: [place] Eyes:     General:        Right eye: No discharge.        Left  eye: No discharge.     Extraocular Movements: EOM normal.     Conjunctiva/sclera: Conjunctivae normal.     Right eye: Right conjunctiva is not injected. No chemosis.    Left eye: Left conjunctiva is not injected. No chemosis.    Pupils: Pupils are equal, round, and reactive to light.  Cardiovascular:     Rate and Rhythm: Normal rate and regular rhythm.     Heart sounds: Normal heart sounds.  Pulmonary:     Effort: Pulmonary effort is normal. No tachypnea, accessory muscle usage or respiratory distress.     Breath sounds: Normal breath sounds. No wheezing, rhonchi or rales.  Chest:     Chest wall: No tenderness.  Lymphadenopathy:     Cervical: No cervical adenopathy.  Skin:    Coloration: Skin is not pale.     Findings: No abrasion, erythema, petechiae or rash. Rash is not papular, urticarial or vesicular.  Neurological:     Mental Status: She is alert.  Psychiatric:        Mood and Affect: Mood and affect normal.        Behavior: Behavior is cooperative.      Diagnostic studies:     Allergy Studies:     Airborne Adult Perc - 05/03/20 1527    Time Antigen Placed 1527    Allergen Manufacturer Lavella Hammock    Location Back    Number of Test 59    1. Control-Buffer 50% Glycerol Negative    2. Control-Histamine 1 mg/ml 2+    3. Albumin saline Negative    4. Dawson Negative    5. [place] Negative    6. Johnson Negative    7. [place] Blue Negative    8. Meadow Fescue Negative    9. Perennial Rye Negative    10. Sweet Vernal Negative    11. Timothy Negative    12. Cocklebur Negative    13. Burweed Marshelder Negative    14. Ragweed, short Negative    15. Ragweed, Giant Negative    16. Plantain,  English Negative    17.  Lamb's Quarters Negative    18. Sheep Sorrell Negative    19. Rough Pigweed Negative    20. Marsh Elder, Rough Negative    21. Mugwort, Common Negative    22. Ash mix Negative    23. Birch mix Negative    24. Beech American Negative    25. Box, Elder  Negative    26. Cedar, red Negative    27. Cottonwood, [place] Negative    28. Elm mix Negative    29. Hickory Negative    30. Maple mix Negative    31. Oak, [place] mix Negative    32. Pecan Pollen Negative    33. Pine mix Negative    34. Sycamore Eastern Negative    35. [place], Black Pollen Negative    36. Alternaria alternata Negative    37. Cladosporium Herbarum Negative    38. Aspergillus mix Negative    39. Penicillium mix Negative    40. Bipolaris sorokiniana (Helminthosporium) Negative    41. Drechslera spicifera (Curvularia) Negative    42. Mucor plumbeus Negative    43. Fusarium moniliforme Negative    44. Aureobasidium pullulans (pullulara) Negative    45. Rhizopus oryzae Negative    46. Botrytis cinera Negative    47. Epicoccum nigrum Negative    48. Phoma betae Negative    49. Candida Albicans Negative    50. Trichophyton mentagrophytes Negative    51. Mite, D Farinae  5,000 AU/ml Negative    52. Mite, D Pteronyssinus  5,000 AU/ml Negative    53. Cat Hair 10,000 BAU/ml Negative    54.  Dog Epithelia Negative    55. Mixed Feathers Negative    56. Horse Epithelia Negative    57. Cockroach, German Negative    58. Mouse Negative    59. Tobacco Leaf Negative          Food Adult Perc - 05/03/20 1500    Time Antigen Placed [place]    Location Back    Number of allergen test 9    25. Shrimp Negative    40. Beef Negative    42. Tomato Negative    47. Mushrooms Negative    55. Grape (White seedless) Negative    56. Orange  Negative    58. Apple Negative    59. Peach Negative    60. Strawberry Negative    63. Pineapple Negative           Allergy testing results were read and interpreted by myself, documented by clinical staff.         Salvatore Marvel, MD [facility] [place]

## 2020-05-03 NOTE — Patient Instructions (Addendum)
1. Chronic rhinitis - Testing today was negative to the entire panel. - Copy of testing results provided. - We are going to get blood work to confirm this. - We will call you in 1-2 weeks with the results of the testing.   2. 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 - We are going to confirm with blood testing to tomato and pineapple. - We will call you in 1-2 weeks with the results of the testing. - EpiPen updated today. - Anaphylaxis Management Plan provided today.  3. Return in about 6 months (around 10/31/2020).    Please inform us of any Emergency Department visits, hospitalizations, or changes in symptoms. Call us before going to the ED for breathing or allergy symptoms since we might be able to fit you in for a sick visit. Feel free to contact us anytime with any questions, problems, or concerns.  It was a pleasure to meet you and your family today!  Websites that have reliable patient information: 1. American Academy of Asthma, Allergy, and Immunology: www.aaaai.org 2. Food Allergy Research and Education (FARE): foodallergy.org 3. Mothers of Asthmatics: http://www.asthmacommunitynetwork.org 4. American College of Allergy, Asthma, and Immunology: www.acaai.org   COVID-19 Vaccine Information can be found at: ShippingScam.co.uk For questions related to vaccine distribution or appointments, please email vaccine@[place] .com or call [phone].   We realize that you might be concerned about having an allergic reaction to the COVID19 vaccines. To help with that concern, WE ARE OFFERING THE COVID19 VACCINES IN OUR OFFICE! Ask the front desk for dates!     "Like" [place] on Facebook and Instagram for our latest updates!      A healthy democracy works best when [employer] participate! Make sure you are registered to vote! If you have moved or changed any of your contact information,  you will need to get this updated before voting!  In some cases, you MAY be able to register to vote online: [email or web address]

## 2020-05-05 ENCOUNTER — Encounter: Payer: Self-pay | Admitting: Allergy & Immunology

## 2020-05-11 ENCOUNTER — Encounter (HOSPITAL_COMMUNITY): Payer: Medicare Other

## 2020-05-11 ENCOUNTER — Ambulatory Visit (HOSPITAL_COMMUNITY): Admission: RE | Admit: 2020-05-11 | Payer: Medicare Other | Source: Ambulatory Visit

## 2020-06-01 ENCOUNTER — Telehealth: Payer: Self-pay | Admitting: Cardiology

## 2020-06-01 NOTE — Telephone Encounter (Signed)
Patient has been authorized for her upcoming  Echo. The date had to be changed to 06/20/2020. Her authorization runs out on the 11th.

## 2020-06-02 ENCOUNTER — Encounter (HOSPITAL_COMMUNITY): Payer: Medicare Other

## 2020-06-02 ENCOUNTER — Ambulatory Visit (HOSPITAL_COMMUNITY): Payer: Medicare Other

## 2020-06-13 ENCOUNTER — Encounter (HOSPITAL_COMMUNITY): Payer: Medicare Other

## 2020-06-20 ENCOUNTER — Ambulatory Visit (INDEPENDENT_AMBULATORY_CARE_PROVIDER_SITE_OTHER): Payer: Medicare Other

## 2020-06-20 DIAGNOSIS — R079 Chest pain, unspecified: Secondary | ICD-10-CM | POA: Diagnosis not present

## 2020-06-20 LAB — ECHOCARDIOGRAM COMPLETE
AR max vel: 3.01 cm2
AV Area VTI: 3.63 cm2
AV Area mean vel: 2.37 cm2
AV Mean grad: 3.8 mmHg
AV Peak grad: 6.8 mmHg
Ao pk vel: 1.3 m/s
Area-P 1/2: 4.41 cm2
Calc EF: 58.8 %
S' Lateral: 2.94 cm
Single Plane A2C EF: 63 %
Single Plane A4C EF: 55.8 %

## 2020-06-22 ENCOUNTER — Telehealth: Payer: Self-pay | Admitting: *Deleted

## 2020-06-22 LAB — ALLERGENS W/COMP RFLX AREA 2

## 2020-06-22 LAB — ALLERGEN, PINEAPPLE, F210

## 2020-06-22 LAB — ALLERGEN, TOMATO F25

## 2020-06-22 LAB — ALLERGEN PROFILE, MOLD

## 2020-06-22 NOTE — Telephone Encounter (Signed)
Patient informed. Copy sent to PCP °

## 2020-06-22 NOTE — Telephone Encounter (Signed)
-----   Message from Satira Sark, MD sent at 06/20/2020  4:54 PM EDT ----- Results reviewed. LVEF is normal at 60-65% (no evidence of cardiomyopathy at this point). Myoview pending.

## 2020-06-23 ENCOUNTER — Encounter (HOSPITAL_COMMUNITY): Payer: Medicare Other

## 2020-06-23 ENCOUNTER — Ambulatory Visit (HOSPITAL_COMMUNITY): Payer: Medicare Other

## 2020-06-25 LAB — ALLERGENS W/COMP RFLX AREA 2
Alternaria Alternata IgE: <0.1 kU/L
Aspergillus Fumigatus IgE: <0.1 kU/L
Cockroach, German IgE: <0.1 kU/L
Cottonwood IgE: <0.1 kU/L
D Farinae IgE: <0.1 kU/L
D Pteronyssinus IgE: <0.1 kU/L
E001-IgE Cat Dander: <0.1 kU/L
Elm, American IgE: <0.1 kU/L
IgE (Immunoglobulin E), Serum: 116 IU/mL (ref 6–495)
Johnson Grass IgE: <0.1 kU/L
Maple/Box Elder IgE: <0.1 kU/L
Mouse Urine IgE: <0.1 kU/L
Pigweed, Rough IgE: <0.1 kU/L
Ragweed, Short IgE: <0.1 kU/L
Timothy Grass IgE: <0.1 kU/L
White Mulberry IgE: <0.1 kU/L

## 2020-06-25 LAB — ALLERGEN PROFILE, MOLD
Aureobasidi Pullulans IgE: <0.1 kU/L
Candida Albicans IgE: <0.1 kU/L
M009-IgE Fusarium proliferatum: <0.1 kU/L
M014-IgE Epicoccum purpur: <0.1 kU/L
Phoma Betae IgE: <0.1 kU/L
Setomelanomma Rostrat: <0.1 kU/L
Stemphylium Herbarum IgE: <0.1 kU/L

## 2020-06-25 LAB — TRYPTASE: Tryptase: 8.6 ug/L (ref 2.2–13.2)

## 2020-07-05 ENCOUNTER — Other Ambulatory Visit (HOSPITAL_COMMUNITY): Payer: Medicare Other

## 2020-07-05 ENCOUNTER — Encounter (HOSPITAL_COMMUNITY): Payer: Medicare Other

## 2020-07-12 ENCOUNTER — Ambulatory Visit (HOSPITAL_COMMUNITY): Admission: RE | Admit: 2020-07-12 | Payer: Medicare Other | Source: Ambulatory Visit

## 2020-07-12 ENCOUNTER — Encounter (HOSPITAL_COMMUNITY): Payer: Medicare Other

## 2020-08-11 DIAGNOSIS — G894 Chronic pain syndrome: Secondary | ICD-10-CM | POA: Insufficient documentation

## 2020-08-16 ENCOUNTER — Ambulatory Visit (HOSPITAL_COMMUNITY)
Admission: RE | Admit: 2020-08-16 | Discharge: 2020-08-16 | Disposition: A | Discharge status: Home / Self Care | Payer: Medicare Other | Source: Ambulatory Visit | Attending: Physician Assistant | Admitting: Physician Assistant

## 2020-08-16 ENCOUNTER — Other Ambulatory Visit (HOSPITAL_COMMUNITY): Payer: Self-pay | Admitting: Physician Assistant

## 2020-08-16 ENCOUNTER — Other Ambulatory Visit: Payer: Self-pay

## 2020-08-16 DIAGNOSIS — R1030 Lower abdominal pain, unspecified: Secondary | ICD-10-CM | POA: Diagnosis not present

## 2020-08-22 DIAGNOSIS — F445 Conversion disorder with seizures or convulsions: Secondary | ICD-10-CM | POA: Insufficient documentation

## 2020-12-27 ENCOUNTER — Other Ambulatory Visit: Payer: Self-pay | Admitting: Physician Assistant

## 2020-12-27 DIAGNOSIS — Z1231 Encounter for screening mammogram for malignant neoplasm of breast: Secondary | ICD-10-CM

## 2021-02-05 ENCOUNTER — Ambulatory Visit: Payer: Medicare Other

## 2021-02-06 ENCOUNTER — Other Ambulatory Visit: Payer: Medicare Other | Admitting: Adult Health

## 2021-03-08 ENCOUNTER — Ambulatory Visit: Payer: Medicare Other

## 2021-04-25 ENCOUNTER — Inpatient Hospital Stay: Admission: RE | Admit: 2021-04-25 | Payer: Medicare Other | Source: Ambulatory Visit

## 2021-05-24 ENCOUNTER — Ambulatory Visit: Payer: Medicare Other

## 2021-05-28 ENCOUNTER — Ambulatory Visit: Payer: Medicare Other

## 2021-06-11 ENCOUNTER — Ambulatory Visit: Payer: Medicare Other

## 2021-07-11 ENCOUNTER — Ambulatory Visit: Payer: Medicare Other

## 2021-09-20 ENCOUNTER — Ambulatory Visit: Payer: Medicare Other | Admitting: Adult Health

## 2021-10-30 ENCOUNTER — Ambulatory Visit: Payer: Medicare Other | Admitting: Adult Health

## 2022-03-21 DIAGNOSIS — M5416 Radiculopathy, lumbar region: Secondary | ICD-10-CM | POA: Insufficient documentation

## 2022-03-21 DIAGNOSIS — M51369 Other intervertebral disc degeneration, lumbar region without mention of lumbar back pain or lower extremity pain: Secondary | ICD-10-CM | POA: Insufficient documentation

## 2022-03-21 DIAGNOSIS — M7918 Myalgia, other site: Secondary | ICD-10-CM | POA: Insufficient documentation

## 2022-03-21 DIAGNOSIS — M542 Cervicalgia: Secondary | ICD-10-CM | POA: Insufficient documentation

## 2022-05-14 ENCOUNTER — Other Ambulatory Visit: Payer: Self-pay | Admitting: Physical Medicine & Rehabilitation

## 2022-05-14 DIAGNOSIS — M5416 Radiculopathy, lumbar region: Secondary | ICD-10-CM

## 2022-06-11 ENCOUNTER — Other Ambulatory Visit: Payer: Medicare Other

## 2022-06-12 ENCOUNTER — Ambulatory Visit
Admission: RE | Admit: 2022-06-12 | Discharge: 2022-06-12 | Disposition: A | Discharge status: Home / Self Care | Payer: 59 | Source: Ambulatory Visit | Attending: Physician Assistant | Admitting: Physician Assistant

## 2022-06-12 ENCOUNTER — Other Ambulatory Visit: Payer: Self-pay | Admitting: Physician Assistant

## 2022-06-12 DIAGNOSIS — Z1231 Encounter for screening mammogram for malignant neoplasm of breast: Secondary | ICD-10-CM

## 2022-08-14 ENCOUNTER — Ambulatory Visit (HOSPITAL_COMMUNITY): Payer: 59 | Admitting: Psychiatry

## 2022-08-14 ENCOUNTER — Telehealth (HOSPITAL_COMMUNITY): Payer: Self-pay | Admitting: Psychiatry

## 2022-08-14 ENCOUNTER — Encounter (HOSPITAL_COMMUNITY): Payer: Self-pay

## 2022-08-14 NOTE — Telephone Encounter (Signed)
Therapist attempted to contact patient via phone regarding scheduled in office appointment and received voicemail recording.  Therapist left message indicating attempt and requesting patient call office. 

## 2022-10-24 ENCOUNTER — Encounter (HOSPITAL_COMMUNITY): Payer: Self-pay

## 2022-10-24 ENCOUNTER — Ambulatory Visit (HOSPITAL_COMMUNITY): Payer: 59 | Admitting: Psychiatry

## 2023-05-20 ENCOUNTER — Telehealth: Payer: Self-pay | Admitting: Cardiology

## 2023-05-20 NOTE — Telephone Encounter (Signed)
 Follow Up:      Patient is calling to check on the status of her clearance.

## 2023-05-20 NOTE — Telephone Encounter (Signed)
 Returned call to pt.  She was inquiring about a surgical clearance from a "dentist" office that was sent on 04/25/23.  Pt made aware that we haven't received anything from anyone on her behalf.  She was provided a fax # for her to provide to her dentist office if they are requiring clearance.

## 2023-05-22 NOTE — Telephone Encounter (Signed)
    Primary Cardiologist:Samuel Diona Browner, MD  Chart reviewed as part of pre-operative protocol coverage. Because of Brenda Richardson's past medical history and time since last visit, he/she will require a follow-up visit in order to better assess preoperative cardiovascular risk.  Pre-op covering staff: - Please schedule in office appointment and call patient to inform them. - Please contact requesting surgeon's office via preferred method (i.e, phone, fax) to inform them of need for appointment prior to surgery.  If applicable, this message will also be routed to pharmacy pool and/or primary cardiologist for input on holding anticoagulant/antiplatelet agent as requested below so that this information is available at time of patient's appointment.   Ronney Asters, NP  05/22/2023, 12:28 PM

## 2023-05-22 NOTE — Telephone Encounter (Signed)
 Pt will need a new pt appt for preop clearance. Pt last seen 04/2020. I will send a message to schedulers to see if they can get a new pt appt preop clearance for the pt.

## 2023-05-22 NOTE — Telephone Encounter (Signed)
   Pre-operative Risk Assessment    Patient Name: Brenda Richardson  DOB: 03-May-1980 MRN: [id number]   Date of last office visit: 04/20/20 DR. McDOWELL Date of next office visit: NONE  Request for Surgical Clearance    Procedure:  Dental Extraction - Amount of Teeth to be Pulled:  3 TEETH SURGICALLY EXTRACTED  Date of Surgery:  Clearance TBD                                Surgeon:  DR. Graylon Gunning, DDS Surgeon's Group or Practice Name:  [facility] Phone number:  [phone] Fax number:  [phone]   Type of Clearance Requested:   - Medical ; NONE INDICATED TO BE HELD PER CLEARANCE FORM   Type of Anesthesia:  General  (OPGA)   Additional requests/questions:    Elpidio Anis   05/22/2023, 9:21 AM

## 2023-05-28 ENCOUNTER — Ambulatory Visit: Admitting: Student

## 2023-05-28 ENCOUNTER — Encounter: Payer: Self-pay | Admitting: Internal Medicine

## 2023-05-28 ENCOUNTER — Ambulatory Visit: Attending: Internal Medicine | Admitting: Internal Medicine

## 2023-05-28 VITALS — BP 120/70 | HR 86 | Ht 66.0 in | Wt 220.8 lb

## 2023-05-28 DIAGNOSIS — I10 Essential (primary) hypertension: Secondary | ICD-10-CM | POA: Diagnosis not present

## 2023-05-28 DIAGNOSIS — Z01810 Encounter for preprocedural cardiovascular examination: Secondary | ICD-10-CM | POA: Insufficient documentation

## 2023-05-28 NOTE — Progress Notes (Signed)
 Cardiology Office Note  Date: 05/28/2023   ID: Brenda Richardson, DOB 1980/06/12, MRN [id number]  PCP:  Ladora Daniel, PA-C  Cardiologist:  Marjo Bicker, MD Electrophysiologist:  None   History of Present Illness: Brenda Richardson is a 43 y.o. female  Patient is newly referred to cardiology clinic for evaluation of chest pains in 2022.  She underwent echocardiogram at that time that showed normal LVEF and no valvular heart disease.  Chest pains resolved spontaneously and she did not return back to cardiology clinic for follow-up visit.  She returned back as a new patient visit for preop cardiac risk stratification for teeth removal.  No angina, DOE in the last 1 year.  No symptoms of dizziness, presyncope, syncope, palpitations, leg swelling.  Past Medical History:  Diagnosis Date   Anemia    Bipolar disorder (HCC)    Breast infection 04/04/2010   Chronic pain    Colonic adenoma    Diverticulitis    Essential hypertension    Gastroparesis    Helicobacter pylori (H. pylori)    Treated   Hemorrhoids, internal    Migraine headache    Obesity    Seizures (HCC)    Urticaria    Vitamin B12 deficiency    Vitamin D deficiency     Past Surgical History:  Procedure Laterality Date   ABDOMINAL HYSTERECTOMY     APPENDECTOMY  2008   lower abd pain   BILATERAL SALPINGOOPHORECTOMY  06/2009   BREAST REDUCTION SURGERY  08/2008   from a 44 DDD to a 44 C   CHOLECYSTECTOMY     COLONOSCOPY  01/04/2010   tubular adenoma of cecum, normal TI, patchy fibrotic changes involving sigm, desc s/p bx neg. Next TCS 12/2016   ESOPHAGOGASTRODUODENOSCOPY  01/04/2010   normal esophagus s/p dilation, small hh, H pylori gastritis   ESOPHAGOGASTRODUODENOSCOPY  2008   florid gastroesophageal reflux during exam but normal mucosa   REDUCTION MAMMAPLASTY Bilateral    TUBAL LIGATION     TUMOR REMOVAL     from left shoulder   WISDOM TOOTH EXTRACTION      Current Outpatient Medications   Medication Sig Dispense Refill   ACCU-CHEK AVIVA PLUS test strip      ACCU-CHEK SOFTCLIX LANCETS lancets USE TO CHECK FASTING GLUCOSE DAILY     Blood Glucose Monitoring Suppl (ACCU-CHEK AVIVA PLUS) w/Device KIT by Does not apply route.     cyclobenzaprine (FLEXERIL) 10 MG tablet Take 10 mg by mouth at bedtime.     EPINEPHrine (AUVI-Q) 0.3 mg/0.3 mL IJ SOAJ injection Inject 0.3 mg into the muscle as needed for anaphylaxis. 2 each 2   lisinopril (PRINIVIL,ZESTRIL) 10 MG tablet      MOUNJARO 12.5 MG/0.5ML Pen Inject 12.5 mg into the skin once a week.     Oxycodone HCl 20 MG TABS Take 20 mg by mouth every 4 (four) hours.     pantoprazole (PROTONIX) 40 MG tablet TAKE ONE TABLET (40 MG TOTAL) BY MOUTH DAILY.  2   sertraline (ZOLOFT) 50 MG tablet Take 1 tablet by mouth daily.     zolpidem (AMBIEN) 10 MG tablet Take 1 tablet by mouth at bedtime.     No current facility-administered medications for this visit.   Allergies:  Demerol [meperidine], Prednisone, Tomato, Tramadol, Cephalosporins, Dust mite extract, Ketorolac tromethamine, Lipitor [atorvastatin], Nalbuphine, Pineapple, Tramadol hcl, Vicodin [hydrocodone-acetaminophen], Zofran, and Aspirin   Social History: The patient  reports that she has never smoked. She has never  used smokeless tobacco. She reports that she does not drink alcohol and does not use drugs.   Family History: The patient's family history includes CAD in her sister; COPD in her sister; Cancer in her father and mother; Heart attack in an other family member.   ROS:  Please see the history of present illness. Otherwise, complete review of systems is positive for none  All other systems are reviewed and negative.   Physical Exam: VS:  BP 120/70   Pulse 86   Ht 5\' 6"  (1.676 m)   Wt 220 lb 12.8 oz (100.2 kg)   SpO2 98%   BMI 35.64 kg/m , BMI Body mass index is 35.64 kg/m.  Wt Readings from Last 3 Encounters:  05/28/23 220 lb 12.8 oz (100.2 kg)  05/03/20 254 lb (115.2  kg)  04/20/20 252 lb (114.3 kg)    General: Patient appears comfortable at rest. HEENT: Conjunctiva and lids normal, oropharynx clear with moist mucosa. Neck: Supple, no elevated JVP or carotid bruits, no thyromegaly. Lungs: Clear to auscultation, nonlabored breathing at rest. Cardiac: Regular rate and rhythm, no S3 or significant systolic murmur, no pericardial rub. Abdomen: Soft, nontender, no hepatomegaly, bowel sounds present, no guarding or rebound. Extremities: No pitting edema, distal pulses 2+. Skin: Warm and dry. Musculoskeletal: No kyphosis. Neuropsychiatric: Alert and oriented x3, affect grossly appropriate.  Recent Labwork: No results found for requested labs within last 365 days.     Component Value Date/Time   CHOL  08/16/2009 0514    170        ATP III CLASSIFICATION:  <200     mg/dL   Desirable  629-528  mg/dL   Borderline High  >=413    mg/dL   High          TRIG 88 08/16/2009 0514   HDL 49 08/16/2009 0514   CHOLHDL 3.5 08/16/2009 0514   VLDL 18 08/16/2009 0514   LDLCALC (H) 08/16/2009 0514    103        Total Cholesterol/HDL:CHD Risk Coronary Heart Disease Risk Table                     Men   Women  1/2 Average Risk   3.4   3.3  Average Risk       5.0   4.4  2 X Average Risk   9.6   7.1  3 X Average Risk  23.4   11.0        Use the calculated Patient Ratio above and the CHD Risk Table to determine the patient's CHD Risk.        ATP III CLASSIFICATION (LDL):  <100     mg/dL   Optimal  244-010  mg/dL   Near or Above                    Optimal  130-159  mg/dL   Borderline  272-536  mg/dL   High  >644     mg/dL   Very High     Assessment and Plan:  Preop cardiac risk stratification for teeth extraction under general anesthesia: No angina or DOE in the last 1 year.  Echocardiogram from 2022 showed normal LVEF and no valvular heart disease.  EKG today showed NSR, no ischemia.  Low risk for any perioperative cardiac complications for low risk  procedure like teeth removal, under general anesthesia.  Follow-up as needed to the cardiology clinic.  She does not have  any cardiac issues.       Medication Adjustments/Labs and Tests Ordered: Current medicines are reviewed at length with the patient today.  Concerns regarding medicines are outlined above.    Disposition:  Follow up prn  Signed Reshma Hoey Verne Spurr, MD, 05/28/2023 11:40 AM    [facility] at [facility] [address] [place], [place], [place] [address]

## 2023-05-28 NOTE — Patient Instructions (Signed)
 Medication Instructions:  Your physician recommends that you continue on your current medications as directed. Please refer to the Current Medication list given to you today.   Labwork: None  Testing/Procedures: None  Follow-Up: Your physician recommends that you schedule a follow-up appointment in: As Needed  Any Other Special Instructions Will Be Listed Below (If Applicable).  Thank you for choosing [facility]!     If you need a refill on your cardiac medications before your next appointment, please call your pharmacy.

## 2023-05-29 NOTE — Telephone Encounter (Signed)
 Preoperative team, patient was seen and evaluated in cardiology clinic yesterday.  Please forward office note to requesting surgeon/surgeons office.  Thank you for your help.  Thomasene Ripple. Everest Hacking NP-C     05/29/2023, 8:04 AM [facility] [address] Office [phone] Fax [phone]

## 2023-05-29 NOTE — Telephone Encounter (Signed)
 Per preop APP I will fax notes to the surgeon office.

## 2023-06-24 DIAGNOSIS — Z789 Other specified health status: Secondary | ICD-10-CM | POA: Insufficient documentation

## 2024-02-06 ENCOUNTER — Encounter (HOSPITAL_COMMUNITY): Payer: Self-pay

## 2024-02-06 ENCOUNTER — Other Ambulatory Visit: Payer: Self-pay

## 2024-02-06 ENCOUNTER — Emergency Department (HOSPITAL_COMMUNITY)
Admission: EM | Admit: 2024-02-06 | Discharge: 2024-02-06 | Disposition: A | Discharge status: Home / Self Care | Attending: Emergency Medicine | Admitting: Emergency Medicine

## 2024-02-06 DIAGNOSIS — N939 Abnormal uterine and vaginal bleeding, unspecified: Secondary | ICD-10-CM | POA: Insufficient documentation

## 2024-02-06 DIAGNOSIS — Z79899 Other long term (current) drug therapy: Secondary | ICD-10-CM | POA: Insufficient documentation

## 2024-02-06 DIAGNOSIS — M5412 Radiculopathy, cervical region: Secondary | ICD-10-CM | POA: Insufficient documentation

## 2024-02-06 DIAGNOSIS — D72829 Elevated white blood cell count, unspecified: Secondary | ICD-10-CM | POA: Insufficient documentation

## 2024-02-06 DIAGNOSIS — Z6839 Body mass index (BMI) 39.0-39.9, adult: Secondary | ICD-10-CM | POA: Insufficient documentation

## 2024-02-06 LAB — CBC WITH DIFFERENTIAL/PLATELET
Abs Immature Granulocytes: 0.02 K/uL (ref 0.00–0.07)
Basophils Absolute: 0.1 K/uL (ref 0.0–0.1)
Basophils Relative: 0 %
Eosinophils Absolute: 0.2 K/uL (ref 0.0–0.5)
Eosinophils Relative: 2 %
HCT: 39.8 % (ref 36.0–46.0)
Hemoglobin: 12.6 g/dL (ref 12.0–15.0)
Immature Granulocytes: 0 %
Lymphocytes Relative: 22 %
Lymphs Abs: 2.7 K/uL (ref 0.7–4.0)
MCH: 26 pg (ref 26.0–34.0)
MCHC: 31.7 g/dL (ref 30.0–36.0)
MCV: 82.2 fL (ref 80.0–100.0)
Monocytes Absolute: 0.5 K/uL (ref 0.1–1.0)
Monocytes Relative: 4 %
Neutro Abs: 8.7 K/uL — ABNORMAL HIGH (ref 1.7–7.7)
Neutrophils Relative %: 72 %
Platelets: 343 K/uL (ref 150–400)
RBC: 4.84 MIL/uL (ref 3.87–5.11)
RDW: 13.3 % (ref 11.5–15.5)
WBC: 12.2 K/uL — ABNORMAL HIGH (ref 4.0–10.5)
nRBC: 0 % (ref 0.0–0.2)

## 2024-02-06 NOTE — ED Provider Notes (Signed)
 [place] EMERGENCY DEPARTMENT AT [facility] Provider Note   CSN: [id number] Arrival date & time: 02/06/24  9896     Patient presents with: Vaginal Bleeding   Brenda Richardson is a 43 y.o. female.   The history is provided by the patient.  Patient presents for postcoital vaginal bleeding.  She reports around midnight she had vaginal intercourse (with condom) for the first time in around 3 years.  Soon after she started having heavy vaginal bleeding and lower abdominal pain and pelvic pain.  She had otherwise been at her baseline.  She reports history of hysterectomy several years ago.  She is not on anticoagulation.     Prior to Admission medications   Medication Sig Start Date End Date Taking? Authorizing Provider  ACCU-CHEK AVIVA PLUS test strip  02/04/17   [provider]  ACCU-CHEK SOFTCLIX LANCETS lancets USE TO CHECK FASTING GLUCOSE DAILY 08/12/16   [provider]  Blood Glucose Monitoring Suppl (ACCU-CHEK AVIVA PLUS) w/Device KIT by Does not apply route. 04/23/16   [provider]  cyclobenzaprine (FLEXERIL) 10 MG tablet Take 10 mg by mouth at bedtime.    [provider]  EPINEPHrine  (AUVI-Q ) 0.3 mg/0.3 mL IJ SOAJ injection Inject 0.3 mg into the muscle as needed for anaphylaxis. 05/03/20   Iva Marty Saltness, MD  lisinopril (PRINIVIL,ZESTRIL) 10 MG tablet  02/07/17   [provider]  MOUNJARO 12.5 MG/0.5ML Pen Inject 12.5 mg into the skin once a week. 04/04/23   [provider]  Oxycodone  HCl 20 MG TABS Take 20 mg by mouth every 4 (four) hours. 05/22/23   [provider]  pantoprazole (PROTONIX) 40 MG tablet TAKE ONE TABLET (40 MG TOTAL) BY MOUTH DAILY. 11/16/16   [provider]  sertraline (ZOLOFT) 50 MG tablet Take 1 tablet by mouth daily. 05/16/23   [provider]  zolpidem (AMBIEN) 10 MG tablet Take 1 tablet by mouth at bedtime. 01/14/16   [provider]    Allergies:  Demerol [meperidine], Prednisone, Tomato, Tramadol, Cephalosporins, Dust mite extract, Ketorolac  tromethamine , Lipitor [atorvastatin], Nalbuphine, Pineapple, Tramadol hcl, Vicodin [hydrocodone -acetaminophen ], Zofran , and Aspirin    Review of Systems  Genitourinary:  Positive for pelvic pain and vaginal bleeding.    Updated Vital Signs BP 107/75   Pulse 85   Temp 98.7 F (37.1 C)   Resp 17   Ht 1.676 m (5' 6)   Wt 100.2 kg   SpO2 97%   BMI 35.65 kg/m   Physical Exam CONSTITUTIONAL: Well developed/well nourished, no distress HEAD: Normocephalic/atraumatic EYES: EOMI/PERRL CV: S1/S2 noted, no murmurs/rubs/gallops noted LUNGS: Lungs are clear to auscultation bilaterally, no apparent distress ABDOMEN: soft, nontender, no rebound or guarding, bowel sounds noted throughout abdomen GU extensive blood noted in the vaginal vault. Blood clot noted at the cervical os. NEURO: Pt is awake/alert/appropriate, moves all extremitiesx4.  No facial droop.   SKIN: warm, color normal  (all labs ordered are listed, but only abnormal results are displayed) Labs Reviewed  CBC WITH DIFFERENTIAL/PLATELET - Abnormal; Notable for the following components:      Result Value   WBC 12.2 (*)    Neutro Abs 8.7 (*)    All other components within normal limits    EKG: None  Radiology: No results found.   Procedures   Medications Ordered in the ED - No data to display  Clinical Course as of 02/06/24 0520  Fri Feb 06, 2024  0406 Discussed the case with on-call gynecology  Dr. Abigail Plan will be to monitor patient in the ER, check hemoglobin and if continues bleeding she can be transferred to [facility] [DW]  [phone] Patient has remained stable. Hemoglobin has been reviewed and is appropriate.  No indication for blood transfusion  She has not had any more bleeding.  The patient feels improved [DW]  0520 She will be discharged home.  We discussed strict ER return precautions.  I advised follow-up with  gynecology next week [DW]    Clinical Course User Index [DW] Midge Golas, MD                                 Medical Decision Making Amount and/or Complexity of Data Reviewed Labs: ordered.        Final diagnoses:  Vaginal bleeding    ED Discharge Orders     None          Midge Golas, MD 02/06/24 [phone]

## 2024-02-06 NOTE — Discharge Instructions (Signed)
 Please return to the ER if you have any increased bleeding, pain, weakness over the next 48 hours. Otherwise follow-up with Dr. Jayne next week

## 2024-02-06 NOTE — ED Triage Notes (Signed)
 Pov from home cc of vaginal bleeding 1.5 hours ago. Says she is having large clots. Had a hysterectomy several years ago. Has pain in her lower stomach. 4/10

## 2024-02-06 NOTE — ED Notes (Signed)
Went over dc papers all questions answered. Ambulatory to lobby  

## 2024-02-11 ENCOUNTER — Encounter: Payer: Self-pay | Admitting: Adult Health

## 2024-02-11 ENCOUNTER — Ambulatory Visit: Admitting: Adult Health

## 2024-02-11 VITALS — BP 116/75 | HR 90 | Ht 66.0 in | Wt 199.0 lb

## 2024-02-11 DIAGNOSIS — Z90710 Acquired absence of both cervix and uterus: Secondary | ICD-10-CM | POA: Insufficient documentation

## 2024-02-11 DIAGNOSIS — N930 Postcoital and contact bleeding: Secondary | ICD-10-CM | POA: Insufficient documentation

## 2024-02-11 NOTE — Progress Notes (Signed)
  Subjective:     Patient ID: Brenda Richardson, female   DOB: [birth year], 43 y.o.   MRN: [id number]  HPI Brenda Richardson is a 43 year old black female, divorced, sp hysterectomy in for follow up from ER for vaginal bleeding after sex 02/06/24,(first sex in 3 years and he was large) still bleeding with small clots now.  PCP is GORMAN Meres PA   Review of Systems Still bleeding after having sex 02/06/24 for first time in 3 years, was seen in ER Reviewed past medical,surgical, social and family history. Reviewed medications and allergies.     Objective:   Physical Exam BP 116/75 (BP Location: Right Arm, Patient Position: Sitting, Cuff Size: Normal)   Pulse 90   Ht 5' 6 (1.676 m)   Wt 199 lb (90.3 kg)   BMI 32.12 kg/m     Skin warm and dry.Pelvic: external genitalia is normal in appearance no lesions, vagina: +blood,urethra has no lesions or masses noted, cervix and uterus are absent, bleeding with clots at vaginal cuff, ? Mild separation at cuff,  Dr Ozan for co exam, after bleeding had slowed from pressure with procto swab, Monsel's applied, adnexa: no masses or tenderness noted. Bladder is non tender and no masses felt.   Upstream - 02/11/24 1206       Pregnancy Intention Screening   Does the patient want to become pregnant in the next year? N/A    Does the patient's partner want to become pregnant in the next year? N/A    Would the patient like to discuss contraceptive options today? N/A      Contraception Wrap Up   Current Method Hysterectomy    End Method Hysterectomy    Contraception Counseling Provided No         Examination chaperoned by Clarita Salt LPN Assessment:     1. Postcoital bleeding (Primary) Bleeding since 11/28, not has heavy +bleeding today, seems to be at vaginal cuff, Dr Ozan in for co exam and monsel's applied No sex Will recheck in about 2 weeks   2. S/P hysterectomy     Plan:     Follow up with me 02/1624 when Dr Jayne here

## 2024-02-24 ENCOUNTER — Ambulatory Visit: Admitting: Adult Health

## 2024-02-24 ENCOUNTER — Telehealth: Payer: Self-pay | Admitting: Adult Health

## 2024-02-24 NOTE — Telephone Encounter (Signed)
 Pt missed appointment. Pt wants to know if 03/16/2024 is too long to wait for another appointment. This is the first available.

## 2024-02-24 NOTE — Telephone Encounter (Signed)
 Left message @ 2:52 pm. JSY

## 2024-02-26 NOTE — Telephone Encounter (Signed)
 Pt is doing good. Not having any bleeding now. Pt feels comfortable waiting until 03/16/24 for appt if that's ok with you. Advised no sex until after her appt. JAG advised she's ok with pt waiting. JSY

## 2024-03-16 ENCOUNTER — Ambulatory Visit: Admitting: Adult Health
